# Patient Record
Sex: Male | Born: 1980
Health system: Southern US, Community
[De-identification: ages and names within clinical notes are randomized; demographics above are authoritative.]

## PROBLEM LIST (undated history)

## (undated) ENCOUNTER — Ambulatory Visit (HOSPITAL_COMMUNITY): Discharge status: Absent without leave | Payer: BLUE CROSS/BLUE SHIELD

## (undated) DIAGNOSIS — Z0389 Encounter for observation for other suspected diseases and conditions ruled out: Secondary | ICD-10-CM

## (undated) DIAGNOSIS — G459 Transient cerebral ischemic attack, unspecified: Secondary | ICD-10-CM

## (undated) DIAGNOSIS — G4733 Obstructive sleep apnea (adult) (pediatric): Secondary | ICD-10-CM

## (undated) DIAGNOSIS — I10 Essential (primary) hypertension: Secondary | ICD-10-CM

## (undated) DIAGNOSIS — IMO0001 Reserved for inherently not codable concepts without codable children: Secondary | ICD-10-CM

## (undated) DIAGNOSIS — I428 Other cardiomyopathies: Secondary | ICD-10-CM

## (undated) DIAGNOSIS — E119 Type 2 diabetes mellitus without complications: Secondary | ICD-10-CM

## (undated) DIAGNOSIS — J45909 Unspecified asthma, uncomplicated: Secondary | ICD-10-CM

## (undated) DIAGNOSIS — I5042 Chronic combined systolic (congestive) and diastolic (congestive) heart failure: Secondary | ICD-10-CM

## (undated) HISTORY — DX: Chronic combined systolic (congestive) and diastolic (congestive) heart failure: I50.42

## (undated) HISTORY — PX: NO PAST SURGERIES: SHX2092

## (undated) HISTORY — DX: Reserved for inherently not codable concepts without codable children: IMO0001

## (undated) HISTORY — DX: Transient cerebral ischemic attack, unspecified: G45.9

## (undated) HISTORY — DX: Other cardiomyopathies: I42.8

## (undated) HISTORY — DX: Obstructive sleep apnea (adult) (pediatric): G47.33

## (undated) HISTORY — DX: Encounter for observation for other suspected diseases and conditions ruled out: Z03.89

---

## 2012-04-30 DIAGNOSIS — G459 Transient cerebral ischemic attack, unspecified: Secondary | ICD-10-CM

## 2012-04-30 HISTORY — DX: Transient cerebral ischemic attack, unspecified: G45.9

## 2012-09-15 ENCOUNTER — Emergency Department (HOSPITAL_COMMUNITY): Payer: Self-pay

## 2012-09-15 ENCOUNTER — Emergency Department (HOSPITAL_COMMUNITY)
Admission: EM | Admit: 2012-09-15 | Discharge: 2012-09-15 | Disposition: A | Discharge status: Home / Self Care | Payer: Self-pay | Attending: Emergency Medicine | Admitting: Emergency Medicine

## 2012-09-15 ENCOUNTER — Encounter (HOSPITAL_COMMUNITY): Payer: Self-pay | Admitting: *Deleted

## 2012-09-15 DIAGNOSIS — I10 Essential (primary) hypertension: Secondary | ICD-10-CM | POA: Insufficient documentation

## 2012-09-15 DIAGNOSIS — R739 Hyperglycemia, unspecified: Secondary | ICD-10-CM

## 2012-09-15 DIAGNOSIS — R51 Headache: Secondary | ICD-10-CM | POA: Insufficient documentation

## 2012-09-15 DIAGNOSIS — R7309 Other abnormal glucose: Secondary | ICD-10-CM | POA: Insufficient documentation

## 2012-09-15 DIAGNOSIS — Z79899 Other long term (current) drug therapy: Secondary | ICD-10-CM | POA: Insufficient documentation

## 2012-09-15 DIAGNOSIS — F17200 Nicotine dependence, unspecified, uncomplicated: Secondary | ICD-10-CM | POA: Insufficient documentation

## 2012-09-15 DIAGNOSIS — J45909 Unspecified asthma, uncomplicated: Secondary | ICD-10-CM | POA: Insufficient documentation

## 2012-09-15 HISTORY — DX: Essential (primary) hypertension: I10

## 2012-09-15 HISTORY — DX: Unspecified asthma, uncomplicated: J45.909

## 2012-09-15 LAB — POCT I-STAT, CHEM 8
Calcium, Ion: 1.14 mmol/L (ref 1.12–1.23)
Creatinine, Ser: 1.1 mg/dL (ref 0.50–1.35)
Glucose, Bld: 174 mg/dL — ABNORMAL HIGH (ref 70–99)
HCT: 52 % (ref 39.0–52.0)
Hemoglobin: 17.7 g/dL — ABNORMAL HIGH (ref 13.0–17.0)
Potassium: 3.8 mEq/L (ref 3.5–5.1)
TCO2: 26 mmol/L (ref 0–100)

## 2012-09-15 LAB — URINALYSIS, ROUTINE W REFLEX MICROSCOPIC
Bilirubin Urine: NEGATIVE
Nitrite: NEGATIVE
Protein, ur: NEGATIVE mg/dL
Specific Gravity, Urine: 1.014 (ref 1.005–1.030)
Urobilinogen, UA: 0.2 mg/dL (ref 0.0–1.0)

## 2012-09-15 LAB — CBC WITH DIFFERENTIAL/PLATELET
Basophils Absolute: 0 10*3/uL (ref 0.0–0.1)
Basophils Relative: 1 % (ref 0–1)
Eosinophils Absolute: 0.3 10*3/uL (ref 0.0–0.7)
HCT: 47.2 % (ref 39.0–52.0)
MCH: 31.7 pg (ref 26.0–34.0)
MCHC: 35.8 g/dL (ref 30.0–36.0)
Monocytes Absolute: 0.6 10*3/uL (ref 0.1–1.0)
Neutro Abs: 3.3 10*3/uL (ref 1.7–7.7)
Neutrophils Relative %: 44 % (ref 43–77)
RDW: 12.2 % (ref 11.5–15.5)

## 2012-09-15 MED ORDER — AMLODIPINE BESYLATE 10 MG PO TABS: 5.0000 mg | ORAL_TABLET | Freq: Every day | ORAL | Status: DC

## 2012-09-15 NOTE — ED Provider Notes (Signed)
History     CSN: [id number]  Arrival date & time 09/15/12  0556   First MD Initiated Contact with Patient 09/15/12 [phone]      Chief Complaint  Patient presents with  . Hypertension    (Consider location/radiation/quality/duration/timing/severity/associated sxs/prior treatment) HPI  Pt is a 32 yo M PMHx significant for HTN presenting for one week of high blood pressures taken at home. Pt states he is currently [place] Lisinopril-HCTZ for his high blood pressure and has been taking medications as prescribed. States he had been on Norvasc when his PCP switched him to Lisinopril-HCTZ in January for better BP control. Patient states his blood pressure has been running high since January as well as this last week, just not as high. He states he has a mild generalized headache w/o any weaknesses or visual disturbances. Pt denies any chest pain, shortness of breath, cough, LOC.   Past Medical History  Diagnosis Date  . Hypertension   . Asthma     History reviewed. No pertinent past surgical history.  No family history on file.  History  Substance Use Topics  . Smoking status: Light Tobacco Smoker  . Smokeless tobacco: Not on file  . Alcohol Use: Yes     Comment: Socially Only      Review of Systems  Constitutional: Negative for fever and chills.  Eyes: Negative for visual disturbance.  Respiratory: Negative for cough, chest tightness, shortness of breath and wheezing.   Cardiovascular: Negative for chest pain, palpitations and leg swelling.  Neurological: Negative for seizures and syncope.  All other systems reviewed and are negative.    Allergies  Review of patient's allergies indicates no known allergies.  Home Medications   Current Outpatient Rx  Name  Route  Sig  Dispense  Refill  . albuterol (PROVENTIL HFA;VENTOLIN HFA) 108 (90 BASE) MCG/ACT inhaler   Inhalation   Inhale 2 puffs into the lungs every 6 (six) hours as needed for  wheezing.         Marland Kitchen albuterol (PROVENTIL) (2.5 MG/3ML) 0.083% nebulizer solution   Nebulization   Take 2.5 mg by nebulization every 6 (six) hours as needed for wheezing.         Marland Kitchen lisinopril-hydrochlorothiazide (PRINZIDE,ZESTORETIC) 20-25 MG per tablet   Oral   Take 1 tablet by mouth daily.           BP 145/111  Pulse 95  Temp(Src) 98.1 F (36.7 C) (Oral)  Resp 18  Ht 6' 2.5" (1.892 m)  Wt 241 lb (109.317 kg)  BMI 30.54 kg/m2  SpO2 98%  Physical Exam  Constitutional: He is oriented to person, place, and time. He appears well-developed and well-nourished.  HENT:  Head: Normocephalic and atraumatic.  Mouth/Throat: Oropharynx is clear and moist.  Eyes: EOM are normal. Pupils are equal, round, and reactive to light.  Fundoscopic exam:      The right eye shows no arteriolar narrowing, no AV nicking, no hemorrhage and no papilledema.       The left eye shows no arteriolar narrowing, no AV nicking, no hemorrhage and no papilledema.  Neck: Neck supple. Carotid bruit is not present.  Cardiovascular: Normal rate, regular rhythm, normal heart sounds and intact distal pulses.   Pulmonary/Chest: Effort normal and breath sounds normal. No respiratory distress. He exhibits no tenderness.  Abdominal: Soft. Bowel sounds are normal. There is no tenderness.  Musculoskeletal: Normal range of motion. He exhibits no edema.  Neurological: He  is alert and oriented to person, place, and time. He has normal strength. No cranial nerve deficit or sensory deficit.  Skin: Skin is warm and dry. No erythema.  Psychiatric: He has a normal mood and affect.    ED Course  Procedures (including critical care time)  Labs Reviewed  POCT I-STAT, CHEM 8 - Abnormal; Notable for the following:    Glucose, Bld 174 (*)    Hemoglobin 17.7 (*)    All other components within normal limits  CBC WITH DIFFERENTIAL  URINALYSIS, ROUTINE W REFLEX MICROSCOPIC   Dg Chest 2 View  09/15/2012   *RADIOLOGY REPORT*   Clinical Data: Hypertension.  CHEST - 2 VIEW  Comparison: None  Findings: The cardiac silhouette, mediastinal and hilar contours are within normal limits.  The lungs are clear.  No pleural effusion.  The bony thorax is intact.  IMPRESSION: No acute cardiopulmonary findings.   Original Report Authenticated By: Rudie Meyer, M.D.     1. Hypertension       MDM  Patient noted to be hypertensive in the emergency department.  No signs of hypertensive urgency. No CP, SOB, diaphoresis. Physical exam benign. Labs, UA, and imaging were reviewed no concern for acute emergent cause of elevated BP.  Discussed with patient the need for close follow-up and management by their primary care physician to better control blood pressure. Also advised patient to purchase a more reliable at home blood pressure cuff to provide more accurate readings. Pt notified of elevated blood sugar and advised to have it re-checked at PCP for management and follow up for possible impending diabetes. Patient agreeable to plan. Patient d/w with Dr. Nicanor Alcon, agrees with plan. Patient is stable at time of discharge.           Jeannetta Ellis, PA-C 09/15/12 [phone]

## 2012-09-15 NOTE — ED Notes (Signed)
Pt states he has been having high BP since yesterday.  204/108 was his highest reading.

## 2012-09-17 NOTE — ED Provider Notes (Signed)
Medical screening examination/treatment/procedure(s) were performed by non-physician practitioner and as supervising physician I was immediately available for consultation/collaboration.  Jasmine Awe, MD 09/17/12 [phone]

## 2012-09-18 ENCOUNTER — Observation Stay (HOSPITAL_COMMUNITY): Payer: Self-pay

## 2012-09-18 ENCOUNTER — Observation Stay (HOSPITAL_COMMUNITY)
Admission: EM | Admit: 2012-09-18 | Discharge: 2012-09-19 | Disposition: A | Discharge status: Home / Self Care | Payer: MEDICAID | Attending: Internal Medicine | Admitting: Internal Medicine

## 2012-09-18 ENCOUNTER — Encounter (HOSPITAL_COMMUNITY): Payer: Self-pay | Admitting: Emergency Medicine

## 2012-09-18 ENCOUNTER — Ambulatory Visit: Payer: Self-pay | Attending: Family Medicine | Admitting: Family Medicine

## 2012-09-18 VITALS — BP 179/123 | HR 100 | Temp 98.7°F | Resp 18 | Wt 253.0 lb

## 2012-09-18 DIAGNOSIS — Z8249 Family history of ischemic heart disease and other diseases of the circulatory system: Secondary | ICD-10-CM | POA: Insufficient documentation

## 2012-09-18 DIAGNOSIS — R079 Chest pain, unspecified: Secondary | ICD-10-CM | POA: Insufficient documentation

## 2012-09-18 DIAGNOSIS — F17200 Nicotine dependence, unspecified, uncomplicated: Secondary | ICD-10-CM | POA: Insufficient documentation

## 2012-09-18 DIAGNOSIS — E348 Other specified endocrine disorders: Secondary | ICD-10-CM

## 2012-09-18 DIAGNOSIS — G459 Transient cerebral ischemic attack, unspecified: Principal | ICD-10-CM | POA: Insufficient documentation

## 2012-09-18 DIAGNOSIS — G930 Cerebral cysts: Secondary | ICD-10-CM | POA: Insufficient documentation

## 2012-09-18 DIAGNOSIS — R209 Unspecified disturbances of skin sensation: Secondary | ICD-10-CM | POA: Insufficient documentation

## 2012-09-18 DIAGNOSIS — I6782 Cerebral ischemia: Secondary | ICD-10-CM | POA: Diagnosis present

## 2012-09-18 DIAGNOSIS — I160 Hypertensive urgency: Secondary | ICD-10-CM

## 2012-09-18 DIAGNOSIS — G939 Disorder of brain, unspecified: Secondary | ICD-10-CM | POA: Diagnosis present

## 2012-09-18 DIAGNOSIS — I10 Essential (primary) hypertension: Secondary | ICD-10-CM | POA: Insufficient documentation

## 2012-09-18 DIAGNOSIS — R0602 Shortness of breath: Secondary | ICD-10-CM | POA: Insufficient documentation

## 2012-09-18 DIAGNOSIS — R51 Headache: Secondary | ICD-10-CM | POA: Insufficient documentation

## 2012-09-18 DIAGNOSIS — J45909 Unspecified asthma, uncomplicated: Secondary | ICD-10-CM | POA: Diagnosis present

## 2012-09-18 LAB — TROPONIN I: Troponin I: <0.3 ng/mL (ref <0.30)

## 2012-09-18 LAB — RAPID URINE DRUG SCREEN, HOSP PERFORMED
Barbiturates: NOT DETECTED
Cocaine: NOT DETECTED
Tetrahydrocannabinol: NOT DETECTED

## 2012-09-18 LAB — POCT I-STAT, CHEM 8
Calcium, Ion: 1.17 mmol/L (ref 1.12–1.23)
Chloride: 108 mEq/L (ref 96–112)
Glucose, Bld: 169 mg/dL — ABNORMAL HIGH (ref 70–99)
HCT: 47 % (ref 39.0–52.0)
Hemoglobin: 16 g/dL (ref 13.0–17.0)
TCO2: 25 mmol/L (ref 0–100)

## 2012-09-18 LAB — GLUCOSE, CAPILLARY: Glucose-Capillary: 100 mg/dL — ABNORMAL HIGH (ref 70–99)

## 2012-09-18 LAB — POCT I-STAT TROPONIN I: Troponin i, poc: 0.01 ng/mL (ref 0.00–0.08)

## 2012-09-18 MED ORDER — LISINOPRIL 20 MG PO TABS
20.0000 mg | ORAL_TABLET | Freq: Every day | ORAL | Status: DC
  Administered 2012-09-19: 20 mg via ORAL
  Filled 2012-09-18: qty 1

## 2012-09-18 MED ORDER — ASPIRIN 325 MG PO TABS: 325.0000 mg | ORAL_TABLET | Freq: Once | ORAL | Status: DC

## 2012-09-18 MED ORDER — ASPIRIN 325 MG PO TABS
325.0000 mg | ORAL_TABLET | Freq: Every day | ORAL | Status: DC
  Filled 2012-09-18: qty 1

## 2012-09-18 MED ORDER — FLUTICASONE PROPIONATE HFA 110 MCG/ACT IN AERO
1.0000 | INHALATION_SPRAY | Freq: Two times a day (BID) | RESPIRATORY_TRACT | Status: DC
  Administered 2012-09-18 – 2012-09-19 (×2): 1 via RESPIRATORY_TRACT
  Filled 2012-09-18: qty 12

## 2012-09-18 MED ORDER — NITROGLYCERIN 0.4 MG SL SUBL: 0.4000 mg | SUBLINGUAL_TABLET | Freq: Once | SUBLINGUAL | Status: DC

## 2012-09-18 MED ORDER — HYDROCHLOROTHIAZIDE 25 MG PO TABS
25.0000 mg | ORAL_TABLET | Freq: Every day | ORAL | Status: DC
  Administered 2012-09-19: 25 mg via ORAL
  Filled 2012-09-18: qty 1

## 2012-09-18 MED ORDER — ACETAMINOPHEN 325 MG PO TABS
650.0000 mg | ORAL_TABLET | ORAL | Status: DC | PRN
  Administered 2012-09-18: 650 mg via ORAL
  Filled 2012-09-18: qty 2

## 2012-09-18 MED ORDER — STROKE: EARLY STAGES OF RECOVERY BOOK
Freq: Once | Status: AC
  Administered 2012-09-18: 21:00:00
  Filled 2012-09-18: qty 1

## 2012-09-18 MED ORDER — LABETALOL HCL 5 MG/ML IV SOLN
10.0000 mg | Freq: Once | INTRAVENOUS | Status: AC
  Administered 2012-09-18: 10 mg via INTRAVENOUS
  Filled 2012-09-18: qty 4

## 2012-09-18 MED ORDER — ALBUTEROL SULFATE HFA 108 (90 BASE) MCG/ACT IN AERS
2.0000 | INHALATION_SPRAY | Freq: Four times a day (QID) | RESPIRATORY_TRACT | Status: DC | PRN
  Filled 2012-09-18: qty 6.7

## 2012-09-18 MED ORDER — LISINOPRIL-HYDROCHLOROTHIAZIDE 20-25 MG PO TABS: 1.0000 | ORAL_TABLET | Freq: Every day | ORAL | Status: DC

## 2012-09-18 MED ORDER — ENOXAPARIN SODIUM 40 MG/0.4ML ~~LOC~~ SOLN
40.0000 mg | SUBCUTANEOUS | Status: DC
  Administered 2012-09-18: 40 mg via SUBCUTANEOUS
  Filled 2012-09-18 (×2): qty 0.4

## 2012-09-18 MED ORDER — LABETALOL HCL 5 MG/ML IV SOLN
10.0000 mg | INTRAVENOUS | Status: DC | PRN
  Administered 2012-09-18 (×2): 10 mg via INTRAVENOUS
  Filled 2012-09-18 (×2): qty 4

## 2012-09-18 NOTE — ED Notes (Signed)
Was seen on the 19 for HTN and tried to see dr but not feeling better and having vague cp left shoulder pain and tingling in fingers

## 2012-09-18 NOTE — ED Notes (Signed)
Patient transported to CT 

## 2012-09-18 NOTE — ED Notes (Signed)
Vascular lab called to report they will complete Doppler and 2 D echo tomorrow

## 2012-09-18 NOTE — H&P (Signed)
[facility] History and Physical  Joseph Shannon HYQ:[id number] DOB: 10-02-1980 DOA: 09/18/2012  Referring physician: ED PCP: No PCP Per Patient  Specialists: none  Chief Complaint: left arm numbness   HPI: Joseph Shannon is a 32 y.o. male with history of HTN, who presented to the urgent care center today c/o left side numbness. He also has noted that his BP is poor ly controlled and every time is up he gets a left facial headache and LUE numbness. Denies chest pain. Symptoms started 2 weeks ago.    Review of Systems: The patient denies anorexia, fever, weight loss,, vision loss, decreased hearing, hoarseness, chest pain, syncope, dyspnea on exertion, peripheral edema, balance deficits, hemoptysis, abdominal pain, melena, hematochezia, severe indigestion/heartburn, hematuria, incontinence, genital sores, muscle weakness, suspicious skin lesions, transient blindness, difficulty walking, depression, unusual weight change, abnormal bleeding, enlarged lymph nodes, angioedema,   Past Medical History  Diagnosis Date  . Hypertension   . Asthma    History reviewed. No pertinent past surgical history. Social History:  reports that he has been smoking Cigarettes.  He has been smoking about 0.50 packs per day. He does not have any smokeless tobacco history on file. He reports that he does not drink alcohol or use illicit drugs. Lives with friend  He just moved to the area from [place] and is looking for a job  No Known Allergies  Family History  Problem Relation Age of Onset  . Hypertension Father   . Hypertension Mother     Prior to Admission medications   Medication Sig Start Date End Date Taking? Authorizing Provider  albuterol (PROVENTIL HFA;VENTOLIN HFA) 108 (90 BASE) MCG/ACT inhaler Inhale 2 puffs into the lungs every 6 (six) hours as needed for wheezing.   Yes Historical Provider, MD  albuterol (PROVENTIL) (2.5 MG/3ML) 0.083% nebulizer solution Take 2.5 mg by  nebulization every 6 (six) hours as needed for wheezing.   Yes Historical Provider, MD  fluticasone (FLOVENT HFA) 110 MCG/ACT inhaler Inhale 1 puff into the lungs 2 (two) times daily.   Yes Historical Provider, MD  lisinopril-hydrochlorothiazide (PRINZIDE,ZESTORETIC) 20-25 MG per tablet Take 1 tablet by mouth daily.   Yes Historical Provider, MD   Physical Exam: Filed Vitals:   09/18/12 1350 09/18/12 1351 09/18/12 1416 09/18/12 1445  BP:  144/94 180/113 170/111  Pulse: 97   85  Temp: 98.3 F (36.8 C)     Resp: 16     SpO2: 99%   99%     General:  axox3  Eyes: perrla, eomi, anicteric, pale  ENT: clear throat   Neck: no JVD  Cardiovascular: RRR, no M<r,g  Respiratory: ctab , no w,r,c   Abdomen: soft, nt, bs present   Skin: dry, no rashes  Musculoskeletal: intact   Psychiatric: euthymic  Neurologic: facial asymmetry, left UE with decreased sensation, strength 5/5, left UE dysmetria, gait intact, speech intact, no aphasia - NIH stroke scale 1-2   Labs on Admission:  Basic Metabolic Panel:  Recent Labs Lab 09/15/12 0638 09/18/12 1423  NA 138 141  K 3.8 4.2  CL 104 108  GLUCOSE 174* 169*  BUN 20 14  CREATININE 1.10 1.00   Liver Function Tests: No results found for this basename: AST, ALT, ALKPHOS, BILITOT, PROT, ALBUMIN,  in the last 168 hours No results found for this basename: LIPASE, AMYLASE,  in the last 168 hours No results found for this basename: AMMONIA,  in the last 168 hours CBC:  Recent Labs Lab 09/15/12 0621 09/15/12  9147 09/18/12 1423  WBC 7.5  --   --   NEUTROABS 3.3  --   --   HGB 16.9 17.7* 16.0  HCT 47.2 52.0 47.0  MCV 88.6  --   --   PLT 214  --   --    Cardiac Enzymes: No results found for this basename: CKTOTAL, CKMB, CKMBINDEX, TROPONINI,  in the last 168 hours  BNP (last 3 results) No results found for this basename: PROBNP,  in the last 8760 hours CBG: No results found for this basename: GLUCAP,  in the last 168  hours  Radiological Exams on Admission: No results found.  EKG: Independently reviewed. NSR, LVH  Assessment/Plan Principal Problem:   TIA (transient ischemic attack) Active Problems:   Hypertension   Asthma   1. TIA - risk factors, tobacco, HTN - DDx PRESS. Will place in observation, Ct head, MRI, labetalol iv, aspirin, frequent neuro checks, a1c, flp 2. LVH - get echo , cycle troponins 3. Asthma - c/w flovent and albuterol prn   Code Status: full (must indicate code status--if unknown or must be presumed, indicate so) Family Communication: patient  (indicate person spoken with, if applicable, with phone number if by telephone) Disposition Plan: home  (indicate anticipated LOS)    Joseph Shannon [facility] Pager [phone]  If 7PM-7AM, please contact night-coverage [email or web address] Password [facility] 09/18/2012, 4:16 PM

## 2012-09-18 NOTE — ED Notes (Signed)
Has been peeing a lot and has h/a

## 2012-09-18 NOTE — ED Notes (Signed)
Pt transported to 3W via stretcher on a monitor by [place] NT

## 2012-09-18 NOTE — ED Notes (Signed)
Reports hypertension for past two weeks, even though meds were changed in January. Reports dizziness, blurred vision, nausea, vomiting. Pain in left shoulder and arm, reports numbness at times.

## 2012-09-18 NOTE — ED Provider Notes (Signed)
History     CSN: [id number]  Arrival date & time 09/18/12  1345   First MD Initiated Contact with Patient 09/18/12 1401      Chief Complaint  Patient presents with  . Hypertension    (Consider location/radiation/quality/duration/timing/severity/associated sxs/prior treatment) HPI Comments: Patient presents with elevated blood pressure. He states he has a history of hypertension in in January was started on lisinopril hydrochlorothiazide. He's been taking his medication consistently since then. He states over last 2 weeks he's had some intermittent chest pains. He frequently has it in the morning. He states it starts in the left side of his chest and as a sharp pain that shoots down his arm into shoulder. He has shortness of breath at times but it's not related to the chest pain. He has nausea at times but it's also not associated with chest pain. He denies any diaphoresis. He went to urgent care Center today for his hypertension and he was complaining of some left shoulder pain radiating down his arm with some tingling in his fingers and they sent him here for further cardiac evaluation. He denies any history of heart problems in the past. He is a smoker but is currently trying to quit smoking. He states he has a family history of heart disease in that his mom had MI her 30s.  Patient is a 32 y.o. male presenting with hypertension.  Hypertension Associated symptoms include chest pain and shortness of breath. Pertinent negatives include no abdominal pain and no headaches.    Past Medical History  Diagnosis Date  . Hypertension   . Asthma     History reviewed. No pertinent past surgical history.  No family history on file.  History  Substance Use Topics  . Smoking status: Light Tobacco Smoker  . Smokeless tobacco: Not on file  . Alcohol Use: Yes     Comment: Socially Only      Review of Systems  Constitutional: Positive for fatigue. Negative for fever, chills and diaphoresis.   HENT: Negative for congestion, rhinorrhea and sneezing.   Eyes: Negative.   Respiratory: Positive for shortness of breath. Negative for cough and chest tightness.   Cardiovascular: Positive for chest pain. Negative for leg swelling.  Gastrointestinal: Positive for nausea. Negative for vomiting, abdominal pain, diarrhea and blood in stool.  Genitourinary: Negative for frequency, hematuria, flank pain and difficulty urinating.  Musculoskeletal: Negative for back pain and arthralgias.  Skin: Negative for rash.  Neurological: Negative for dizziness, speech difficulty, weakness, numbness and headaches.    Allergies  Review of patient's allergies indicates no known allergies.  Home Medications   Current Outpatient Rx  Name  Route  Sig  Dispense  Refill  . albuterol (PROVENTIL HFA;VENTOLIN HFA) 108 (90 BASE) MCG/ACT inhaler   Inhalation   Inhale 2 puffs into the lungs every 6 (six) hours as needed for wheezing.         Marland Kitchen albuterol (PROVENTIL) (2.5 MG/3ML) 0.083% nebulizer solution   Nebulization   Take 2.5 mg by nebulization every 6 (six) hours as needed for wheezing.         . fluticasone (FLOVENT HFA) 110 MCG/ACT inhaler   Inhalation   Inhale 1 puff into the lungs 2 (two) times daily.         Marland Kitchen lisinopril-hydrochlorothiazide (PRINZIDE,ZESTORETIC) 20-25 MG per tablet   Oral   Take 1 tablet by mouth daily.           BP 180/113  Pulse 97  Temp(Src) 98.3  F (36.8 C)  Resp 16  SpO2 99%  Physical Exam  Constitutional: He is oriented to person, place, and time. He appears well-developed and well-nourished.  HENT:  Head: Normocephalic and atraumatic.  Eyes: Pupils are equal, round, and reactive to light.  Neck: Normal range of motion. Neck supple.  Cardiovascular: Normal rate, regular rhythm and normal heart sounds.   Pulmonary/Chest: Effort normal and breath sounds normal. No respiratory distress. He has no wheezes. He has no rales. He exhibits no tenderness.   Abdominal: Soft. Bowel sounds are normal. There is no tenderness. There is no rebound and no guarding.  Musculoskeletal: Normal range of motion. He exhibits no edema.  Patient does have some reproducible tenderness to left shoulder.  Lymphadenopathy:    He has no cervical adenopathy.  Neurological: He is alert and oriented to person, place, and time.  Skin: Skin is warm and dry. No rash noted.  Psychiatric: He has a normal mood and affect.    ED Course  Procedures (including critical care time)  Results for orders placed during the hospital encounter of 09/18/12  POCT I-STAT, CHEM 8      Result Value Range   Sodium 141  135 - 145 mEq/L   Potassium 4.2  3.5 - 5.1 mEq/L   Chloride 108  96 - 112 mEq/L   BUN 14  6 - 23 mg/dL   Creatinine, Ser 4.09  0.50 - 1.35 mg/dL   Glucose, Bld 811 (*) 70 - 99 mg/dL   Calcium, Ion 9.14  7.82 - 1.23 mmol/L   TCO2 25  0 - 100 mmol/L   Hemoglobin 16.0  13.0 - 17.0 g/dL   HCT 95.6  21.3 - 08.6 %  POCT I-STAT TROPONIN I      Result Value Range   Troponin i, poc 0.01  0.00 - 0.08 ng/mL   Comment 3            Dg Chest 2 View  09/15/2012   *RADIOLOGY REPORT*  Clinical Data: Hypertension.  CHEST - 2 VIEW  Comparison: None  Findings: The cardiac silhouette, mediastinal and hilar contours are within normal limits.  The lungs are clear.  No pleural effusion.  The bony thorax is intact.  IMPRESSION: No acute cardiopulmonary findings.   Original Report Authenticated By: Rudie Meyer, M.D.    Date: 09/18/2012  Rate: 96  Rhythm: normal sinus rhythm  QRS Axis: normal  Intervals: normal  ST/T Wave abnormalities: normal, slight ST depression  Conduction Disutrbances:none  Narrative Interpretation:   Old EKG Reviewed: none available     1. Chest pain       MDM  Patient with a left-sided chest pain radiating to his left shoulder and arm. He does have some exertional shortness of breath. He has no history of heart disease but he is a smoker. He has a  history of hypertension and a strong family history of diabetes and early heart disease. His blood sugars are elevated at 169 and his blood sugar a few days ago was 179. He has no primary care physician. Given these factors I feel the best to place in observation for a chest pain rule out. He had some minor ST depression in lead 3 but I don't see other EKG abnormalities. He has no old EKG for comparison. He was given aspirin prior to arrival and he was given a nitroglycerin that subsided his symptoms. He currently denies any chest pain. He had a chest x-ray 3 days ago which  was normal so I do not feel this needs be repeated.        Rolan Bucco, MD 09/18/12 1447

## 2012-09-18 NOTE — Progress Notes (Signed)
Patient was seen in the ED this past week for HTN Stated they did not do anything for him  No additional medication given Presents today with a B/P 179/123

## 2012-09-18 NOTE — ED Notes (Signed)
Receiving RN will return call for report in 10 mins

## 2012-09-18 NOTE — Progress Notes (Signed)
Patient ID: Joseph Shannon, male   DOB: 12/12/1980, 32 y.o.   MRN: [id number]  Pt arrived today for ED f/u having been seen recently for HTN. Today his BP is 179/123. He also c/o headache as well as vague mild chest pain,  left arm pain and tingling, and shob. These have been going on progressively worsening for 1 week.   GIven these sx have given pt asa and nitro x 1 and am having him xported to the ED for further evaluation. He is stable for non-ambulance xport and will go via shuttle.   If time prior to shuttle arriving will obtain ecg.   Am happy to see pt in f/u after he has been discharged.

## 2012-09-19 ENCOUNTER — Observation Stay (HOSPITAL_COMMUNITY): Payer: MEDICAID

## 2012-09-19 DIAGNOSIS — I517 Cardiomegaly: Secondary | ICD-10-CM

## 2012-09-19 DIAGNOSIS — E348 Other specified endocrine disorders: Secondary | ICD-10-CM

## 2012-09-19 DIAGNOSIS — I160 Hypertensive urgency: Secondary | ICD-10-CM

## 2012-09-19 LAB — LIPID PANEL: LDL Cholesterol: 122 mg/dL — ABNORMAL HIGH (ref 0–99)

## 2012-09-19 LAB — GLUCOSE, CAPILLARY: Glucose-Capillary: 109 mg/dL — ABNORMAL HIGH (ref 70–99)

## 2012-09-19 MED ORDER — METOPROLOL TARTRATE 25 MG PO TABS: 25.0000 mg | ORAL_TABLET | Freq: Two times a day (BID) | ORAL | Status: DC

## 2012-09-19 MED ORDER — LISINOPRIL-HYDROCHLOROTHIAZIDE 20-25 MG PO TABS: 2.0000 | ORAL_TABLET | Freq: Every day | ORAL | Status: DC

## 2012-09-19 MED ORDER — LABETALOL HCL 200 MG PO TABS
200.0000 mg | ORAL_TABLET | Freq: Two times a day (BID) | ORAL | Status: DC
  Administered 2012-09-19: 200 mg via ORAL
  Filled 2012-09-19 (×2): qty 1

## 2012-09-19 MED ORDER — GADOBENATE DIMEGLUMINE 529 MG/ML IV SOLN
20.0000 mL | Freq: Once | INTRAVENOUS | Status: AC
  Administered 2012-09-19: 20 mL via INTRAVENOUS

## 2012-09-19 NOTE — Discharge Summary (Signed)
Physician Discharge Summary  Joseph Shannon ZOX:[id number] DOB: Apr 24, 1981 DOA: 09/18/2012  PCP: No PCP Per Patient  Admit date: 09/18/2012 Discharge date: 09/19/2012  Recommendations for Outpatient Follow-up:  1. Follow up with primary care doctor within 1-2 weeks of discharge for blood pressure management 2. Follow up MRI brain with contrast in 3 months 3. Please refer for outpatient stress test to evaluate possible apical hypokinesia.    Discharge Diagnoses:  Principal Problem:   TIA (transient ischemic attack) Active Problems:   Hypertension   Asthma   Hypertensive urgency   Pineal gland cyst   Discharge Condition: stable, improved  Diet recommendation: healthy heart  Wt Readings from Last 3 Encounters:  09/18/12 114.76 kg (253 lb)  09/18/12 114.76 kg (253 lb)  09/15/12 109.317 kg (241 lb)    History of present illness:   Joseph Shannon is a 32 y.o. male with history of HTN, who presented to the urgent care center today c/o left side numbness. He also has noted that his BP is poor ly controlled and every time is up he gets a left facial headache and LUE numbness. Denies chest pain. Symptoms started 2 weeks ago.    Hospital Course:   Intermittent numbness of the LUE and left facial headache during episode of high blood pressure. Completely resolved after blood pressure trended down.   - Control blood pressure  - CT head normal  - MRI brain negative for acute stroke and PRESS, but positive for abnormal pineal gland.  - A1c: 5.9  - Carotid duplex neg  - ECHO:  Mild LVH and EF of 50-55% with possible hypokinesis of the apical myocardium.  Normal left atrium.     Lab Results   Component  Value  Date    CHOL  200  09/19/2012    HDL  42  09/19/2012    LDLCALC  122*  09/19/2012    TRIG  178*  09/19/2012    CHOLHDL  4.8  09/19/2012    Pineal gland cystic structure 1.5 x 1.5 x 0.9 cm with mild compression of the superior colliculus.   -  Repeat MRI with contrast  demonstrated nonenhancing cyst of the pineal gland.  -  Spoke with Dr. Yetta Barre from Neurosurgery who recommends repeat MRI in 3 months  -  Possible bulge of the M1 segment that is likely artifact. Aneurysm less likely   HTN:  - Increased lisinopril and HCTZ home medications  - Added metoprolol BID   Asthma, stable. Continue prn albuterol.    Consultants:  none Procedures:  CT head  MRI brain WO contrast  MRI brain W contrast  MRA brain  Carotid duplex  ECHO Antibiotics:  none   Discharge Exam: Filed Vitals:   09/19/12 1356  BP: 163/91  Pulse: 85  Temp: 98.8 F (37.1 C)  Resp: 19   Filed Vitals:   09/19/12 0000 09/19/12 0400 09/19/12 1000 09/19/12 1356  BP: 154/97 157/99 148/88 163/91  Pulse: 80 75 80 85  Temp: 98.7 F (37.1 C) 98.3 F (36.8 C) 98.5 F (36.9 C) 98.8 F (37.1 C)  TempSrc:   Oral Oral  Resp: 18 18 20 19   Height:      Weight:      SpO2: 95% 96% 98% 94%    General: Obese AAM, No acute distress  HEENT: NCAT, MMM  Cardiovascular: RRR, nl S1, S2 no mrg, 2+ pulses, warm extremities  Respiratory: CTAB, no increased WOB  Abdomen: NABS, soft, NT/ND  MSK: Normal tone  and bulk, no LEE  Neuro: CN II-XII grossly intact, strength 5/5 throughout, sensation intact to light touch   Discharge Instructions      Discharge Orders   Future Orders Complete By Expires     Call MD for:  difficulty breathing, headache or visual disturbances  As directed     Call MD for:  extreme fatigue  As directed     Call MD for:  hives  As directed     Call MD for:  persistant dizziness or light-headedness  As directed     Call MD for:  persistant nausea and vomiting  As directed     Call MD for:  severe uncontrolled pain  As directed     Call MD for:  temperature >100.4  As directed     Diet - low sodium heart healthy  As directed     Discharge instructions  As directed     Comments:      You were hospitalized with some vision changes, tingling of the left arm, and  severe high blood pressure.  Please increase your blood pressure medication lisinopril-HCTZ to 2 tabs once a day and add a new blood pressure medication metoprolol.  Please follow up with your primary care doctor within 1-2 weeks of discharge to review your blood pressure.  If you can, please check your blood pressure at the pharmacy or [facility] a few times before you follow up and write down the numbers to take with you to your follow up appointment.  You did not have a stroke, but you do have a cyst of the pineal gland.  This is a common finding and usually benign.  Please have your primary care doctor repeat an MRI in about 3 months to make sure it is not changing.  Finally, you had some thickening of the heart muscle and some change in the squeeze at the apex of your heart.  Your primary care doctor may refer you to have a stress test to make sure your heart is pumping normally.    Increase activity slowly  As directed         Medication List    TAKE these medications       albuterol (2.5 MG/3ML) 0.083% nebulizer solution  Commonly known as:  PROVENTIL  Take 2.5 mg by nebulization every 6 (six) hours as needed for wheezing.     albuterol 108 (90 BASE) MCG/ACT inhaler  Commonly known as:  PROVENTIL HFA;VENTOLIN HFA  Inhale 2 puffs into the lungs every 6 (six) hours as needed for wheezing.     fluticasone 110 MCG/ACT inhaler  Commonly known as:  FLOVENT HFA  Inhale 1 puff into the lungs 2 (two) times daily.     lisinopril-hydrochlorothiazide 20-25 MG per tablet  Commonly known as:  PRINZIDE,ZESTORETIC  Take 2 tablets by mouth daily.     metoprolol tartrate 25 MG tablet  Commonly known as:  LOPRESSOR  Take 1 tablet (25 mg total) by mouth 2 (two) times daily.       Follow-up Information   Follow up with No PCP Per Patient In 1 week.   Contact information:   [address] [place] [address] [phone]       The results of significant diagnostics from this  hospitalization (including imaging, microbiology, ancillary and laboratory) are listed below for reference.    Significant Diagnostic Studies: Dg Chest 2 View  09/15/2012   *RADIOLOGY REPORT*  Clinical Data: Hypertension.  CHEST -  2 VIEW  Comparison: None  Findings: The cardiac silhouette, mediastinal and hilar contours are within normal limits.  The lungs are clear.  No pleural effusion.  The bony thorax is intact.  IMPRESSION: No acute cardiopulmonary findings.   Original Report Authenticated By: Rudie Meyer, M.D.   Ct Head Wo Contrast  09/18/2012   *RADIOLOGY REPORT*  Clinical Data: Headache, right arm numbness, TIA, history hypertension, asthma  CT HEAD WITHOUT CONTRAST  Technique:  Contiguous axial images were obtained from the base of the skull through the vertex without contrast.  Comparison: None  Findings: Normal ventricular morphology. No midline shift or mass effect. Normal appearance of brain parenchyma. No intracranial hemorrhage, mass lesion or evidence of acute infarction. No extra-axial fluid collections. Bones and sinuses unremarkable.  IMPRESSION: No acute intracranial abnormalities.   Original Report Authenticated By: Ulyses Southward, M.D.   Mri Brain Without Contrast  09/18/2012   *RADIOLOGY REPORT*  Clinical Data:  Left arm numbness for past week.  High blood pressure.  Left facial headaches.  MRI BRAIN WITHOUT CONTRAST MRA HEAD WITHOUT CONTRAST  Technique: Multiplanar, multiecho pulse sequences of the brain and surrounding structures were obtained according to standard protocol without intravenous contrast.  Angiographic images of the head were obtained using MRA technique without contrast.  Comparison: 09/18/2012 head CT.  No comparison brain MR.  MRI HEAD  Findings:  No acute infarct.  No intracranial hemorrhage.  No hydrocephalus.  Pineal gland is enlarged appearing cystic spanning over 1.5 x 1.5 x 0.9 cm.  Mild compression of the superior colliculus. This may represent an incidental  finding assuming the patient does not have diplopia with upward gaze.  Dedicated thin section pre and postcontrast MR imaging to exclude worrisome enhancement can be performed at the present time as a baseline exam.  Minimal calcifications along the anterior margin on accompanying CT.  Decreased signal intensity bone marrow of the clivus and upper cervical spine may be normal for this patient.  Correlation with CBC to exclude anemia contributing to this appearance may be considered.  Exophthalmos.  IMPRESSION: No acute infarct.  Enlarged cystic-appearing pineal gland with follow up as noted above.  Decreased signal intensity bone marrow of the clivus and upper cervical spine may be normal for this patient.  Correlation with CBC to exclude anemia contributing to this appearance may be considered.  MRA HEAD  Findings: Anterior circulation without medium or large size vessel significant stenosis or occlusion.  Bulge superior margin of the M1 segment of the left middle cerebral artery appears to be an origin of a vessel on source sequence. Aneurysm felt a less likely consideration.  Stability can be confirmed on follow-up.  Middle cerebral artery mild branch vessel irregularity.  Ectatic vertebral arteries and basilar artery.  No high-grade stenosis.  Nonvisualization right PICA and left AICA.  Low-lying large right AICA.  Posterior cerebral artery and the superior cerebral artery mild branch vessel irregularity.  IMPRESSION: Anterior circulation without medium or large size vessel significant stenosis or occlusion.  Bulge superior margin of the M1 segment of the left middle cerebral artery appears to be an origin of a vessel on source sequence. Aneurysm felt a less likely consideration.  Stability can be confirmed on follow-up.  Ectatic vertebral arteries and basilar artery.  Nonvisualization right PICA and left AICA.  Low-lying large right AICA.   Original Report Authenticated By: Lacy Duverney, M.D.   Mr Laqueta Jean  Contrast  09/19/2012   *RADIOLOGY REPORT*  Clinical Data: Evaluate pineal  cyst.  MRI HEAD WITH CONTRAST  Technique:  Multiplanar, multiecho pulse sequences of the brain and surrounding structures were obtained according to standard protocol with intravenous contrast  Contrast:  20 ml Multihance IV  Comparison: MRI 09/18/2012  Findings: Prominent pineal cyst measures 16 x 9 mm on sagittal images.  This has CSF signal intensity.  Post contrast thin section imaging was performed.  No enhancing mass lesion is seen in the cyst.  This appears to be a benign pineal cyst.  There is mild mass effect on the superior tectum as noted previously.  No obstructive hydrocephalous.  Normal enhancement of the brain.  IMPRESSION: Prominent pineal cyst has benign signal characteristics without associated soft tissue mass.  There is mass effect on the superior tectum which could cause symptoms of third cranial nerve palsy. Correlate with physical exam.   Original Report Authenticated By: Janeece Riggers, M.D.   Mr Mra Head/brain Wo Cm  09/18/2012   *RADIOLOGY REPORT*  Clinical Data:  Left arm numbness for past week.  High blood pressure.  Left facial headaches.  MRI BRAIN WITHOUT CONTRAST MRA HEAD WITHOUT CONTRAST  Technique: Multiplanar, multiecho pulse sequences of the brain and surrounding structures were obtained according to standard protocol without intravenous contrast.  Angiographic images of the head were obtained using MRA technique without contrast.  Comparison: 09/18/2012 head CT.  No comparison brain MR.  MRI HEAD  Findings:  No acute infarct.  No intracranial hemorrhage.  No hydrocephalus.  Pineal gland is enlarged appearing cystic spanning over 1.5 x 1.5 x 0.9 cm.  Mild compression of the superior colliculus. This may represent an incidental finding assuming the patient does not have diplopia with upward gaze.  Dedicated thin section pre and postcontrast MR imaging to exclude worrisome enhancement can be performed at the  present time as a baseline exam.  Minimal calcifications along the anterior margin on accompanying CT.  Decreased signal intensity bone marrow of the clivus and upper cervical spine may be normal for this patient.  Correlation with CBC to exclude anemia contributing to this appearance may be considered.  Exophthalmos.  IMPRESSION: No acute infarct.  Enlarged cystic-appearing pineal gland with follow up as noted above.  Decreased signal intensity bone marrow of the clivus and upper cervical spine may be normal for this patient.  Correlation with CBC to exclude anemia contributing to this appearance may be considered.  MRA HEAD  Findings: Anterior circulation without medium or large size vessel significant stenosis or occlusion.  Bulge superior margin of the M1 segment of the left middle cerebral artery appears to be an origin of a vessel on source sequence. Aneurysm felt a less likely consideration.  Stability can be confirmed on follow-up.  Middle cerebral artery mild branch vessel irregularity.  Ectatic vertebral arteries and basilar artery.  No high-grade stenosis.  Nonvisualization right PICA and left AICA.  Low-lying large right AICA.  Posterior cerebral artery and the superior cerebral artery mild branch vessel irregularity.  IMPRESSION: Anterior circulation without medium or large size vessel significant stenosis or occlusion.  Bulge superior margin of the M1 segment of the left middle cerebral artery appears to be an origin of a vessel on source sequence. Aneurysm felt a less likely consideration.  Stability can be confirmed on follow-up.  Ectatic vertebral arteries and basilar artery.  Nonvisualization right PICA and left AICA.  Low-lying large right AICA.   Original Report Authenticated By: Lacy Duverney, M.D.    Microbiology: No results found for this or any  previous visit (from the past 240 hour(s)).   Labs: Basic Metabolic Panel:  Recent Labs Lab 09/15/12 0638 09/18/12 1423  NA 138 141  K  3.8 4.2  CL 104 108  GLUCOSE 174* 169*  BUN 20 14  CREATININE 1.10 1.00   Liver Function Tests: No results found for this basename: AST, ALT, ALKPHOS, BILITOT, PROT, ALBUMIN,  in the last 168 hours No results found for this basename: LIPASE, AMYLASE,  in the last 168 hours No results found for this basename: AMMONIA,  in the last 168 hours CBC:  Recent Labs Lab 09/15/12 0621 09/15/12 0638 09/18/12 1423  WBC 7.5  --   --   NEUTROABS 3.3  --   --   HGB 16.9 17.7* 16.0  HCT 47.2 52.0 47.0  MCV 88.6  --   --   PLT 214  --   --    Cardiac Enzymes:  Recent Labs Lab 09/18/12 1632 09/18/12 2228  TROPONINI <0.30 <0.30   BNP: BNP (last 3 results) No results found for this basename: PROBNP,  in the last 8760 hours CBG:  Recent Labs Lab 09/18/12 2154 09/19/12 0750 09/19/12 1128  GLUCAP 100* 109* 107*    Time coordinating discharge: 45 minutes  Signed:  Carie Kapuscinski  [facility] 09/19/2012, 3:11 PM

## 2012-09-19 NOTE — Progress Notes (Signed)
*  PRELIMINARY RESULTS* Vascular Ultrasound Carotid Doppler has been completed.  Preliminary findings:  Bilaterally <40% ICA stenosis. Antegrade vertebral flow.  Farrel Demark, RDMS, RVT  09/19/2012, 8:28 AM

## 2012-09-19 NOTE — Care Management Note (Signed)
    Page 1 of 1   09/19/2012     12:49:00 PM   CARE MANAGEMENT NOTE 09/19/2012  Patient:  Joseph Shannon, Joseph Shannon   Account Number:  [id number]  Date Initiated:  09/19/2012  Documentation initiated by:  GRAVES-BIGELOW,Amrit Erck  Subjective/Objective Assessment:   Pt admitted with left arm numbness. Bp was elevated.  Pt was in a procedure at time of visit.     Action/Plan:   CM did provide pt with list of PCP and the best option for the [facility]. Pt to make a f/u appointment. MD please try to keep pt on cheap meds that can be purchased on the $4.00 med list.   Anticipated DC Date:  09/19/2012   Anticipated DC Plan:  HOME/SELF CARE      DC Planning Services  CM consult      Choice offered to / List presented to:             Status of service:  Completed, signed off Medicare Important Message given?   (If response is "NO", the following Medicare IM given date fields will be blank) Date Medicare IM given:   Date Additional Medicare IM given:    Discharge Disposition:  HOME/SELF CARE  Per UR Regulation:  Reviewed for med. necessity/level of care/duration of stay  If discussed at Long Length of Stay Meetings, dates discussed:    Comments:  09-19-12 1245 Tomi Bamberger, RN, BSN [phone] No further needs from Cm at this time.

## 2012-09-19 NOTE — Progress Notes (Signed)
  Echocardiogram 2D Echocardiogram has been performed.  Joseph Shannon 09/19/2012, 9:09 AM

## 2012-09-19 NOTE — Progress Notes (Signed)
[facility] PROGRESS NOTE  Joseph Shannon NWG:[id number] DOB: [birth year] DOA: 09/18/2012 PCP: No PCP Per Patient  Assessment/Plan  Intermittent numbness of the LUE and left facial headache during episode of hight blood pressure.  Completely resolved after blood pressure trended down.   -  Control blood pressure -  CT head normal -  MRI brain negative for acute stroke and PRESS, but positive for abnormal pineal gland.   -  A1c:  5.9 -  Carotid duplex neg -  ECHO pending, but symptoms less suggestive of TIA and more suggestive of hypertensive urgency so may be followed up by his primary care doctor if not read by the time patient is medically ready for discharge.    Lab Results  Component Value Date   CHOL 200 09/19/2012   HDL 42 09/19/2012   LDLCALC 122* 09/19/2012   TRIG 178* 09/19/2012   CHOLHDL 4.8 09/19/2012   Pineal gland cystic structure 1.5 x 1.5 x 0.9 cm with mild compression of the superior colliculus -  Spoke with Dr. Yetta Barre who recommends MRI with contrast of the pineal region -  If enhancing, will need NSGY consult -  If nonenhancing, will need repeat MRI in 3 months by PCP to see if enlarging or changing    Possible bulge of the M1 segment that is likely artifact.  Aneurysm less likely  HTN:   -  Continue lisinopril and HCTZ home medications -  Add labetalol BID -  Continue prn labetalol IV  Asthma, stable.  Continue prn albuterol  Diet:  Healthy heart Access:  PIV  IVF:  OFF Proph:  lovenox  Code Status: full Family Communication: spoke with patient alone Disposition Plan: pending results of MRI brain with contrast. If enhancement, may be tumor and needs NSGY consultation.  If not enhancing, may be discharged with follow up with PCP.  Repeat MRI in 3 months with contrast   Consultants:  none  Procedures:  CT head  MRI brain WO contrast  MRI brain W contrast  MRA brain  Carotid duplex  ECHO  Antibiotics:  none    HPI/Subjective:  When blood pressure was elevated, patient had HA and left arm tingling and blurry vision.  Currently symptom free.  Denies diplopia when looking up.     Objective: Filed Vitals:   09/18/12 2030 09/18/12 2115 09/19/12 0000 09/19/12 0400  BP: 172/120 162/99 154/97 157/99  Pulse: 81  80 75  Temp: 98.6 F (37 C)  98.7 F (37.1 C) 98.3 F (36.8 C)  TempSrc: Oral     Resp: 18  18 18   Height:      Weight:      SpO2: 96%  95% 96%   No intake or output data in the 24 hours ending 09/19/12 0852 Filed Weights   09/18/12 1844  Weight: 114.76 kg (253 lb)    Exam:   General:  Obese AAM, No acute distress  HEENT:  NCAT, MMM  Cardiovascular:  RRR, nl S1, S2 no mrg, 2+ pulses, warm extremities  Respiratory:  CTAB, no increased WOB  Abdomen:   NABS, soft, NT/ND  MSK:   Normal tone and bulk, no LEE  Neuro:  CN II-XII grossly intact, strength 5/5 throughout, sensation intact to light touch  Data Reviewed: Basic Metabolic Panel:  Recent Labs Lab 09/15/12 0638 09/18/12 1423  NA 138 141  K 3.8 4.2  CL 104 108  GLUCOSE 174* 169*  BUN 20 14  CREATININE 1.10 1.00   Liver Function  Tests: No results found for this basename: AST, ALT, ALKPHOS, BILITOT, PROT, ALBUMIN,  in the last 168 hours No results found for this basename: LIPASE, AMYLASE,  in the last 168 hours No results found for this basename: AMMONIA,  in the last 168 hours CBC:  Recent Labs Lab 09/15/12 0621 09/15/12 0638 09/18/12 1423  WBC 7.5  --   --   NEUTROABS 3.3  --   --   HGB 16.9 17.7* 16.0  HCT 47.2 52.0 47.0  MCV 88.6  --   --   PLT 214  --   --    Cardiac Enzymes:  Recent Labs Lab 09/18/12 1632 09/18/12 2228  TROPONINI <0.30 <0.30   BNP (last 3 results) No results found for this basename: PROBNP,  in the last 8760 hours CBG:  Recent Labs Lab 09/18/12 2154 09/19/12 0750  GLUCAP 100* 109*    No results found for this or any previous visit (from the past 240  hour(s)).   Studies: Ct Head Wo Contrast  09/18/2012   *RADIOLOGY REPORT*  Clinical Data: Headache, right arm numbness, TIA, history hypertension, asthma  CT HEAD WITHOUT CONTRAST  Technique:  Contiguous axial images were obtained from the base of the skull through the vertex without contrast.  Comparison: None  Findings: Normal ventricular morphology. No midline shift or mass effect. Normal appearance of brain parenchyma. No intracranial hemorrhage, mass lesion or evidence of acute infarction. No extra-axial fluid collections. Bones and sinuses unremarkable.  IMPRESSION: No acute intracranial abnormalities.   Original Report Authenticated By: Ulyses Southward, M.D.   Mri Brain Without Contrast  09/18/2012   *RADIOLOGY REPORT*  Clinical Data:  Left arm numbness for past week.  High blood pressure.  Left facial headaches.  MRI BRAIN WITHOUT CONTRAST MRA HEAD WITHOUT CONTRAST  Technique: Multiplanar, multiecho pulse sequences of the brain and surrounding structures were obtained according to standard protocol without intravenous contrast.  Angiographic images of the head were obtained using MRA technique without contrast.  Comparison: 09/18/2012 head CT.  No comparison brain MR.  MRI HEAD  Findings:  No acute infarct.  No intracranial hemorrhage.  No hydrocephalus.  Pineal gland is enlarged appearing cystic spanning over 1.5 x 1.5 x 0.9 cm.  Mild compression of the superior colliculus. This may represent an incidental finding assuming the patient does not have diplopia with upward gaze.  Dedicated thin section pre and postcontrast MR imaging to exclude worrisome enhancement can be performed at the present time as a baseline exam.  Minimal calcifications along the anterior margin on accompanying CT.  Decreased signal intensity bone marrow of the clivus and upper cervical spine may be normal for this patient.  Correlation with CBC to exclude anemia contributing to this appearance may be considered.  Exophthalmos.   IMPRESSION: No acute infarct.  Enlarged cystic-appearing pineal gland with follow up as noted above.  Decreased signal intensity bone marrow of the clivus and upper cervical spine may be normal for this patient.  Correlation with CBC to exclude anemia contributing to this appearance may be considered.  MRA HEAD  Findings: Anterior circulation without medium or large size vessel significant stenosis or occlusion.  Bulge superior margin of the M1 segment of the left middle cerebral artery appears to be an origin of a vessel on source sequence. Aneurysm felt a less likely consideration.  Stability can be confirmed on follow-up.  Middle cerebral artery mild branch vessel irregularity.  Ectatic vertebral arteries and basilar artery.  No high-grade stenosis.  Nonvisualization right PICA and left AICA.  Low-lying large right AICA.  Posterior cerebral artery and the superior cerebral artery mild branch vessel irregularity.  IMPRESSION: Anterior circulation without medium or large size vessel significant stenosis or occlusion.  Bulge superior margin of the M1 segment of the left middle cerebral artery appears to be an origin of a vessel on source sequence. Aneurysm felt a less likely consideration.  Stability can be confirmed on follow-up.  Ectatic vertebral arteries and basilar artery.  Nonvisualization right PICA and left AICA.  Low-lying large right AICA.   Original Report Authenticated By: Lacy Duverney, M.D.   Mr Mra Head/brain Wo Cm  09/18/2012   *RADIOLOGY REPORT*  Clinical Data:  Left arm numbness for past week.  High blood pressure.  Left facial headaches.  MRI BRAIN WITHOUT CONTRAST MRA HEAD WITHOUT CONTRAST  Technique: Multiplanar, multiecho pulse sequences of the brain and surrounding structures were obtained according to standard protocol without intravenous contrast.  Angiographic images of the head were obtained using MRA technique without contrast.  Comparison: 09/18/2012 head CT.  No comparison brain MR.   MRI HEAD  Findings:  No acute infarct.  No intracranial hemorrhage.  No hydrocephalus.  Pineal gland is enlarged appearing cystic spanning over 1.5 x 1.5 x 0.9 cm.  Mild compression of the superior colliculus. This may represent an incidental finding assuming the patient does not have diplopia with upward gaze.  Dedicated thin section pre and postcontrast MR imaging to exclude worrisome enhancement can be performed at the present time as a baseline exam.  Minimal calcifications along the anterior margin on accompanying CT.  Decreased signal intensity bone marrow of the clivus and upper cervical spine may be normal for this patient.  Correlation with CBC to exclude anemia contributing to this appearance may be considered.  Exophthalmos.  IMPRESSION: No acute infarct.  Enlarged cystic-appearing pineal gland with follow up as noted above.  Decreased signal intensity bone marrow of the clivus and upper cervical spine may be normal for this patient.  Correlation with CBC to exclude anemia contributing to this appearance may be considered.  MRA HEAD  Findings: Anterior circulation without medium or large size vessel significant stenosis or occlusion.  Bulge superior margin of the M1 segment of the left middle cerebral artery appears to be an origin of a vessel on source sequence. Aneurysm felt a less likely consideration.  Stability can be confirmed on follow-up.  Middle cerebral artery mild branch vessel irregularity.  Ectatic vertebral arteries and basilar artery.  No high-grade stenosis.  Nonvisualization right PICA and left AICA.  Low-lying large right AICA.  Posterior cerebral artery and the superior cerebral artery mild branch vessel irregularity.  IMPRESSION: Anterior circulation without medium or large size vessel significant stenosis or occlusion.  Bulge superior margin of the M1 segment of the left middle cerebral artery appears to be an origin of a vessel on source sequence. Aneurysm felt a less likely  consideration.  Stability can be confirmed on follow-up.  Ectatic vertebral arteries and basilar artery.  Nonvisualization right PICA and left AICA.  Low-lying large right AICA.   Original Report Authenticated By: Lacy Duverney, M.D.    Scheduled Meds: . aspirin  325 mg Oral Daily  . enoxaparin (LOVENOX) injection  40 mg Subcutaneous Q24H  . fluticasone  1 puff Inhalation BID  . lisinopril  20 mg Oral Daily   And  . hydrochlorothiazide  25 mg Oral Daily   Continuous Infusions:   Principal Problem:  TIA (transient ischemic attack) Active Problems:   Hypertension   Asthma    Time spent: 30 min    Cobey Raineri, [facility]  [facility] Pager [phone]. If 7PM-7AM, please contact night-coverage at [email or web address], password [facility] 09/19/2012, 8:52 AM  LOS: 1 day

## 2013-01-26 ENCOUNTER — Emergency Department (HOSPITAL_COMMUNITY): Payer: Self-pay

## 2013-01-26 ENCOUNTER — Emergency Department (HOSPITAL_COMMUNITY)
Admission: EM | Admit: 2013-01-26 | Discharge: 2013-01-26 | Disposition: A | Discharge status: Home / Self Care | Payer: Self-pay | Attending: Emergency Medicine | Admitting: Emergency Medicine

## 2013-01-26 ENCOUNTER — Encounter (HOSPITAL_COMMUNITY): Payer: Self-pay | Admitting: *Deleted

## 2013-01-26 DIAGNOSIS — F17200 Nicotine dependence, unspecified, uncomplicated: Secondary | ICD-10-CM | POA: Insufficient documentation

## 2013-01-26 DIAGNOSIS — I10 Essential (primary) hypertension: Secondary | ICD-10-CM | POA: Insufficient documentation

## 2013-01-26 DIAGNOSIS — Z79899 Other long term (current) drug therapy: Secondary | ICD-10-CM | POA: Insufficient documentation

## 2013-01-26 DIAGNOSIS — R059 Cough, unspecified: Secondary | ICD-10-CM | POA: Insufficient documentation

## 2013-01-26 DIAGNOSIS — J45909 Unspecified asthma, uncomplicated: Secondary | ICD-10-CM | POA: Insufficient documentation

## 2013-01-26 DIAGNOSIS — R05 Cough: Secondary | ICD-10-CM | POA: Insufficient documentation

## 2013-01-26 DIAGNOSIS — R079 Chest pain, unspecified: Secondary | ICD-10-CM | POA: Insufficient documentation

## 2013-01-26 DIAGNOSIS — R51 Headache: Secondary | ICD-10-CM | POA: Insufficient documentation

## 2013-01-26 LAB — PRO B NATRIURETIC PEPTIDE: Pro B Natriuretic peptide (BNP): 28.7 pg/mL (ref 0–125)

## 2013-01-26 LAB — CBC
HCT: 42 % (ref 39.0–52.0)
Hemoglobin: 15.5 g/dL (ref 13.0–17.0)
RBC: 4.65 MIL/uL (ref 4.22–5.81)
RDW: 12.3 % (ref 11.5–15.5)
WBC: 5.1 10*3/uL (ref 4.0–10.5)

## 2013-01-26 LAB — POCT I-STAT TROPONIN I
Troponin i, poc: 0 ng/mL (ref 0.00–0.08)
Troponin i, poc: 0 ng/mL (ref 0.00–0.08)

## 2013-01-26 LAB — BASIC METABOLIC PANEL
BUN: 13 mg/dL (ref 6–23)
CO2: 23 mEq/L (ref 19–32)
Calcium: 9.1 mg/dL (ref 8.4–10.5)
Chloride: 102 mEq/L (ref 96–112)
GFR calc Af Amer: >90 mL/min (ref >90)
Potassium: 3.8 mEq/L (ref 3.5–5.1)

## 2013-01-26 MED ORDER — ASPIRIN 325 MG PO TABS
325.0000 mg | ORAL_TABLET | Freq: Once | ORAL | Status: AC
  Administered 2013-01-26: 325 mg via ORAL
  Filled 2013-01-26: qty 1

## 2013-01-26 MED ORDER — NITROGLYCERIN 0.4 MG SL SUBL
0.4000 mg | SUBLINGUAL_TABLET | SUBLINGUAL | Status: AC
  Administered 2013-01-26: 0.4 mg via SUBLINGUAL

## 2013-01-26 MED ORDER — METOPROLOL TARTRATE 25 MG PO TABS: 25.0000 mg | ORAL_TABLET | Freq: Two times a day (BID) | ORAL | Status: DC

## 2013-01-26 NOTE — ED Provider Notes (Signed)
CSN: [id number]     Arrival date & time 01/26/13  0840 History   First MD Initiated Contact with Patient 01/26/13 [phone]     Chief Complaint  Patient presents with  . Hypertension  . Cough  . Chest Pain   (Consider location/radiation/quality/duration/timing/severity/associated sxs/prior Treatment) Patient is a 32 y.o. male presenting with hypertension, cough, and chest pain. The history is provided by the patient. No language interpreter was used.  Hypertension This is a recurrent problem. The current episode started more than 2 days ago. The problem occurs daily. The problem has not changed since onset.Associated symptoms include chest pain, headaches and shortness of breath. Pertinent negatives include no abdominal pain. Nothing aggravates the symptoms. The symptoms are relieved by rest. He has tried water (Rx meds) for the symptoms. The treatment provided mild relief.  Cough Cough characteristics:  Productive Sputum characteristics:  Clear and bloody (specs of BRB) Severity:  Moderate Onset quality:  Sudden Duration:  6 hours Progression:  Improving Chronicity:  New Smoker: yes   Context: not sick contacts and not upper respiratory infection   Relieved by:  Cough suppressants Worsened by:  Nothing tried Ineffective treatments:  None tried Associated symptoms: chest pain, headaches and shortness of breath   Associated symptoms: no chills, no diaphoresis, no fever, no myalgias, no rash, no rhinorrhea, no sinus congestion and no wheezing   Chest Pain Pain location:  Substernal area Pain quality: tightness   Pain radiates to:  Does not radiate Pain radiates to the back: no   Pain severity:  Moderate (10/10 at onset, now 5/10) Onset quality:  Sudden Duration:  6 hours Timing:  Constant Progression:  Improving Chronicity:  New Context comment:  While coughing Relieved by:  Nothing Worsened by:  Deep breathing Ineffective treatments:  None tried Associated symptoms: cough,  headache and shortness of breath   Associated symptoms: no abdominal pain, no anxiety, no back pain, no diaphoresis, no dizziness, no dysphagia, no fatigue, no fever, no lower extremity edema, no nausea, no near-syncope, no numbness, no palpitations, no syncope, not vomiting and no weakness   Risk factors: hypertension, male sex and smoking     Past Medical History  Diagnosis Date  . Hypertension   . Asthma    No past surgical history on file. Family History  Problem Relation Age of Onset  . Hypertension Father   . Hypertension Mother    History  Substance Use Topics  . Smoking status: Heavy Tobacco Smoker -- 0.50 packs/day    Types: Cigarettes  . Smokeless tobacco: Not on file  . Alcohol Use: No     Comment: Socially Only    Review of Systems  Constitutional: Negative for fever, chills, diaphoresis, activity change, appetite change and fatigue.  HENT: Negative for congestion, facial swelling, rhinorrhea and trouble swallowing.   Eyes: Negative for photophobia and pain.  Respiratory: Positive for cough and shortness of breath. Negative for chest tightness and wheezing.   Cardiovascular: Positive for chest pain. Negative for palpitations, leg swelling, syncope and near-syncope.  Gastrointestinal: Negative for nausea, vomiting, abdominal pain, diarrhea and constipation.  Endocrine: Negative for polydipsia and polyuria.  Genitourinary: Negative for dysuria, urgency, decreased urine volume and difficulty urinating.  Musculoskeletal: Negative for myalgias, back pain and gait problem.  Skin: Negative for color change, rash and wound.  Allergic/Immunologic: Negative for immunocompromised state.  Neurological: Positive for headaches. Negative for dizziness, facial asymmetry, speech difficulty, weakness and numbness.  Psychiatric/Behavioral: Negative for confusion, decreased concentration and agitation.  Allergies  Review of patient's allergies indicates no known allergies.  Home  Medications   Current Outpatient Rx  Name  Route  Sig  Dispense  Refill  . albuterol (PROVENTIL HFA;VENTOLIN HFA) 108 (90 BASE) MCG/ACT inhaler   Inhalation   Inhale 2 puffs into the lungs daily.          Marland Kitchen albuterol (PROVENTIL) (2.5 MG/3ML) 0.083% nebulizer solution   Nebulization   Take 2.5 mg by nebulization every 6 (six) hours as needed for wheezing.         . fluticasone (FLOVENT HFA) 110 MCG/ACT inhaler   Inhalation   Inhale 1 puff into the lungs 2 (two) times daily as needed (for wheezing).          Marland Kitchen lisinopril-hydrochlorothiazide (PRINZIDE,ZESTORETIC) 20-25 MG per tablet   Oral   Take 2 tablets by mouth daily.   60 tablet   1   . metoprolol tartrate (LOPRESSOR) 25 MG tablet   Oral   Take 1 tablet (25 mg total) by mouth 2 (two) times daily.   60 tablet   1    BP 158/98  Pulse 65  Temp(Src) 98.2 F (36.8 C) (Oral)  Resp 14  SpO2 95% Physical Exam  Constitutional: He is oriented to person, place, and time. He appears well-developed and well-nourished. No distress.  HENT:  Head: Normocephalic and atraumatic.  Mouth/Throat: No oropharyngeal exudate.  Eyes: Pupils are equal, round, and reactive to light.  Neck: Normal range of motion. Neck supple.  Cardiovascular: Normal rate, regular rhythm and normal heart sounds.  Exam reveals no gallop and no friction rub.   No murmur heard. Pulmonary/Chest: Effort normal and breath sounds normal. No respiratory distress. He has no wheezes. He has no rales. He exhibits tenderness.  Abdominal: Soft. Bowel sounds are normal. He exhibits no distension and no mass. There is no tenderness. There is no rebound and no guarding.  Musculoskeletal: Normal range of motion. He exhibits no edema and no tenderness.  Neurological: He is alert and oriented to person, place, and time.  Skin: Skin is warm and dry.  Psychiatric: He has a normal mood and affect.    ED Course  Procedures (including critical care time) Labs Review Labs  Reviewed  CBC - Abnormal; Notable for the following:    MCHC 36.9 (*)    All other components within normal limits  BASIC METABOLIC PANEL - Abnormal; Notable for the following:    Glucose, Bld 112 (*)    All other components within normal limits  PRO B NATRIURETIC PEPTIDE  POCT I-STAT TROPONIN I  POCT I-STAT TROPONIN I   Imaging Review Dg Chest 2 View  01/26/2013   CLINICAL DATA:  Hypertension with cough and chest pain  EXAM: CHEST  2 VIEW  COMPARISON:  Sep 15, 2012  FINDINGS: The lungs are clear. Heart size and pulmonary vascularity are normal. No adenopathy. No bone lesions. There is upper thoracic levoscoliosis.  IMPRESSION: No edema or consolidation.   Electronically Signed   By: Bretta Bang   On: 01/26/2013 09:17     Date: 01/26/2013  Rate: 90  Rhythm: normal sinus rhythm  QRS Axis: normal  Intervals: normal  ST/T Wave abnormalities: normal  Conduction Disutrbances: none  Narrative Interpretation: unremarkable    MDM   1. Hypertension   2. Chest pain    Pt is a 32 y.o. male with Pmhx as above who presents with intermittent h/a for 4 days with elevated BP, now this morning  with central chest tight and episode of coughing with specs of BRB.  On PE, T 99.4, BP elevated at 172/113.  Cardiopulm exam benign.  Neuro exam benign.  EKG unchanged from prior. Pt admitted 5/'13 for TIA/HTN, found to have possible apical hypokinesis, and benign appearing pineal cyst.  He was started on lopressor & prinzide, asked to f/u with PCP.  He has not yet been able to establish with one and has not been able to have stress testing as directed.  CXR unremarkable, trop neg x2.  Pt's h/a, CP, and BP improved after 1 SL NTG.  I doubt hypertensive emergency, ACS, but do feel pt needs stress testing as outpt.   1:26 PM Pt has PCP appt scheduled by Oct 6th by care coordinator, will sign up for orange card at that time as well.   cardiology will set up pt for stress testing this week.   1.  Hypertension   2. Chest pain         Shanna Cisco, MD 01/26/13 [phone]

## 2013-01-26 NOTE — ED Notes (Signed)
EKG given to Dr. J 

## 2013-01-26 NOTE — ED Notes (Signed)
Pt is here with fluctuating blood pressures, cough with some blood, chest pain.

## 2013-01-26 NOTE — Discharge Planning (Signed)
[facility]  Patient is to follow up with the [facility] [facility] on [address]. February 04, 2013 at 2:00pm to see a doctor and 3:00pm to enroll for the orange card. Patient is aware of the appointments, application and my contact information was given for any questions or concerns.

## 2013-01-27 ENCOUNTER — Telehealth (HOSPITAL_COMMUNITY): Payer: Self-pay | Admitting: Emergency Medicine

## 2013-01-27 NOTE — ED Notes (Signed)
Pt calling and stating we need to put outpatient order for stress test w/Juana Diaz in for him.  I read ED MD providers note and it says [facility] will contact him this week to set up stress test.  Pt informed.

## 2013-02-02 ENCOUNTER — Ambulatory Visit: Payer: Self-pay

## 2013-02-04 ENCOUNTER — Ambulatory Visit: Payer: No Typology Code available for payment source | Attending: Internal Medicine | Admitting: Internal Medicine

## 2013-02-04 ENCOUNTER — Ambulatory Visit: Payer: Self-pay | Attending: Internal Medicine

## 2013-02-04 VITALS — BP 181/127 | HR 74 | Temp 98.0°F | Resp 16 | Wt 242.0 lb

## 2013-02-04 DIAGNOSIS — G459 Transient cerebral ischemic attack, unspecified: Secondary | ICD-10-CM

## 2013-02-04 DIAGNOSIS — I10 Essential (primary) hypertension: Secondary | ICD-10-CM | POA: Insufficient documentation

## 2013-02-04 MED ORDER — LISINOPRIL-HYDROCHLOROTHIAZIDE 20-25 MG PO TABS: 2.0000 | ORAL_TABLET | Freq: Every day | ORAL | Status: DC

## 2013-02-04 MED ORDER — METOPROLOL TARTRATE 25 MG PO TABS: 25.0000 mg | ORAL_TABLET | Freq: Two times a day (BID) | ORAL | Status: DC

## 2013-02-04 NOTE — Patient Instructions (Signed)
Transient Ischemic Attack A transient ischemic attack (TIA) is a "warning stroke" that causes stroke-like symptoms. Unlike a stroke, a TIA does not cause permanent damage to the brain. The symptoms of a TIA can happen very fast and do not last long. It is important to know the symptoms of a TIA and what to do. This can help prevent a major stroke or death. CAUSES   A TIA is caused by a temporary blockage in an artery in the brain or neck (carotid artery). The blockage does not allow the brain to get the blood supply it needs and can cause different symptoms. The blockage can be caused by either:  A blood clot.  Fatty buildup (plaque) in a neck or brain artery. RISK FACTORS  High blood pressure (hypertension).  High cholesterol.  Diabetes mellitus.  Heart disease.  The build up of plaque in the blood vessels (peripheral artery disease or atherosclerosis).  The build up of plaque in the blood vessels providing blood and oxygen to the brain (carotid artery stenosis).  An abnormal heart rhythm (atrial fibrillation).  Obesity.  Smoking.  Taking oral contraceptives (especially in combination with smoking).  Physical inactivity.  A diet high in fats, salt (sodium), and calories.  Alcohol use.  Use of illegal drugs (especially cocaine and methamphetamine).  Being male.  Being African American.  Being over the age of 70.  Family history of stroke.  Previous history of blood clots, stroke, TIA, or heart attack.  Sickle cell disease. SYMPTOMS  TIA symptoms are the same as a stroke but are temporary. These symptoms usually develop suddenly, or may be newly present upon awakening from sleep:  Sudden weakness or numbness of the face, arm, or leg, especially on one side of the body.  Sudden trouble walking or difficulty moving arms or legs.  Sudden confusion.  Sudden personality changes.  Trouble speaking (aphasia) or understanding.  Difficulty swallowing.  Sudden  trouble seeing in one or both eyes.  Double vision.  Dizziness.  Loss of balance or coordination.  Sudden severe headache with no known cause.  Trouble reading or writing.  Loss of bowel or bladder control.  Loss of consciousness. DIAGNOSIS  Your caregiver may be able to determine the presence or absence of a TIA based on your symptoms, history, and physical exam. Computed tomography (CT scan) of the brain is usually performed to help identify a TIA. Other tests may be done to diagnose a TIA. These tests may include:  Electrocardiography.  Continuous heart monitoring.  Echocardiography.  Carotid ultrasonography.  Magnetic resonance imaging (MRI).  A scan of the brain circulation.  Blood tests. PREVENTION  The risk of a TIA can be decreased by appropriately treating high blood pressure, high cholesterol, diabetes, heart disease, and obesity and by quitting smoking, limiting alcohol, and staying physically active. TREATMENT  Time is of the essence. Since the symptoms of TIA are the same as a stroke, it is important to seek treatment within 3 4 hours of the start of symptoms because you may receive a medicine to dissolve the clot (thrombolytic) that cannot be given after that time. Treatment options vary. Treatment options may include rest, oxygen, intravenous (IV) fluids, and medicines to thin the blood (anticoagulants). Medicines and diet may be used to address diabetes, high blood pressure, and other risk factors. Measures will be taken to prevent short-term and long-term complications, including infection from breathing foreign material into the lungs (aspiration pneumonia), blood clots in the legs, and falls. Treatment options  include procedures to either remove plaque in the carotid arteries or dilate carotid arteries that have narrowed due to plaque. Those procedures are:  Carotid endarterectomy.  Carotid angioplasty and stenting. HOME CARE INSTRUCTIONS   Take all  medicines prescribed by your caregiver. Follow the directions carefully. Medicines may be used to control risk factors for a stroke. Be sure you understand all your medicine instructions.  You may be told to take aspirin or the anticoagulant warfarin. Warfarin needs to be taken exactly as instructed.  Taking too much or too little warfarin is dangerous. Too much warfarin increases the risk of bleeding. Too little warfarin continues to allow the risk for blood clots. While taking warfarin, you will need to have regular blood tests to measure your blood clotting time. A PT blood test measures how long it takes for blood to clot. Your PT is used to calculate another value called an INR. Your PT and INR help your caregiver to adjust your dose of warfarin. The dose can change for many reasons. It is critically important that you take warfarin exactly as prescribed.  Many foods, especially foods high in vitamin K can interfere with warfarin and affect the PT and INR. Foods high in vitamin K include spinach, kale, broccoli, cabbage, collard and turnip greens, brussels sprouts, peas, cauliflower, seaweed, and parsley as well as beef and pork liver, green tea, and soybean oil. You should eat a consistent amount of foods high in vitamin K. Avoid major changes in your diet, or notify your caregiver before changing your diet. Arrange a visit with a dietitian to answer your questions.  Many medicines can interfere with warfarin and affect the PT and INR. You must tell your caregiver about any and all medicines you take, this includes all vitamins and supplements. Be especially cautious with aspirin and anti-inflammatory medicines. Do not take or discontinue any prescribed or over-the-counter medicine except on the advice of your caregiver or pharmacist.  Warfarin can have side effects, such as excessive bruising or bleeding. You will need to hold pressure over cuts for longer than usual. Your caregiver or pharmacist  will discuss other potential side effects.  Avoid sports or activities that may cause injury or bleeding.  Be mindful when shaving, flossing your teeth, or handling sharp objects.  Alcohol can change the body's ability to handle warfarin. It is best to avoid alcoholic drinks or consume only very small amounts while taking warfarin. Notify your caregiver if you change your alcohol intake.  Notify your dentist or other caregivers before procedures.  Eat a diet that includes 5 or more servings of fruits and vegetables each day. This may reduce the risk of stroke. Certain diets may be prescribed to address high blood pressure, high cholesterol, diabetes, or obesity.  A low-sodium, low-saturated fat, low-trans fat, low-cholesterol diet is recommended to manage high blood pressure.  A low-saturated fat, low-trans fat, low-cholesterol, and high-fiber diet may control cholesterol levels.  A controlled-carbohydrate, controlled-sugar diet is recommended to manage diabetes.  A reduced-calorie, low-sodium, low-saturated fat, low-trans fat, low-cholesterol diet is recommended to manage obesity.  Maintain a healthy weight.  Stay physically active. It is recommended that you get at least 30 minutes of activity on most or all days.  Do not smoke.  Limit alcohol use even if you are not taking warfarin. Moderate alcohol use is considered to be:  No more than 2 drinks each day for men.  No more than 1 drink each day for nonpregnant women.  Stop  drug abuse.  Home safety. A safe home environment is important to reduce the risk of falls. Your caregiver may arrange for specialists to evaluate your home. Having grab bars in the bedroom and bathroom is often important. Your caregiver may arrange for equipment to be used at home, such as raised toilets and a seat for the shower.  Follow all instructions for follow-up with your caregiver. This is very important. This includes any referrals and lab tests.  Proper follow up can prevent a stroke or another TIA from occurring. SEEK MEDICAL CARE IF:  You have personality changes.  You have difficulty swallowing.  You are seeing double.  You have dizziness.  You have a fever.  You have skin breakdown. SEEK IMMEDIATE MEDICAL CARE IF:  Any of these symptoms may represent a serious problem that is an emergency. Do not wait to see if the symptoms will go away. Get medical help right away. Call your local emergency services (911 in [place] Do not drive yourself to the hospital.  You have sudden weakness or numbness of the face, arm, or leg, especially on one side of the body.  You have sudden trouble walking or difficulty moving arms or legs.  You have sudden confusion.  You have trouble speaking (aphasia) or understanding.  You have sudden trouble seeing in one or both eyes.  You have a loss of balance or coordination.  You have a sudden, severe headache with no known cause.  You have new chest pain or an irregular heartbeat.  You have a partial or total loss of consciousness. MAKE SURE YOU:   Understand these instructions.  Will watch your condition.  Will get help right away if you are not doing well or get worse. Document Released: 01/24/2005 Document Revised: 04/02/2012 Document Reviewed: 06/09/2009 [facility] Patient Information 2014 [place], [place]. Hypertension As your heart beats, it forces blood through your arteries. This force is your blood pressure. If the pressure is too high, it is called hypertension (HTN) or high blood pressure. HTN is dangerous because you may have it and not know it. High blood pressure may mean that your heart has to work harder to pump blood. Your arteries may be narrow or stiff. The extra work puts you at risk for heart disease, stroke, and other problems.  Blood pressure consists of two numbers, a higher number over a lower, 110/72, for example. It is stated as "110 over 72." The ideal is below  120 for the top number (systolic) and under 80 for the bottom (diastolic). Write down your blood pressure today. You should pay close attention to your blood pressure if you have certain conditions such as:  Heart failure.  Prior heart attack.  Diabetes  Chronic kidney disease.  Prior stroke.  Multiple risk factors for heart disease. To see if you have HTN, your blood pressure should be measured while you are seated with your arm held at the level of the heart. It should be measured at least twice. A one-time elevated blood pressure reading (especially in the Emergency Department) does not mean that you need treatment. There may be conditions in which the blood pressure is different between your right and left arms. It is important to see your caregiver soon for a recheck. Most people have essential hypertension which means that there is not a specific cause. This type of high blood pressure may be lowered by changing lifestyle factors such as:  Stress.  Smoking.  Lack of exercise.  Excessive weight.  Drug/tobacco/alcohol use.  Eating less salt. Most people do not have symptoms from high blood pressure until it has caused damage to the body. Effective treatment can often prevent, delay or reduce that damage. TREATMENT  When a cause has been identified, treatment for high blood pressure is directed at the cause. There are a large number of medications to treat HTN. These fall into several categories, and your caregiver will help you select the medicines that are best for you. Medications may have side effects. You should review side effects with your caregiver. If your blood pressure stays high after you have made lifestyle changes or started on medicines,   Your medication(s) may need to be changed.  Other problems may need to be addressed.  Be certain you understand your prescriptions, and know how and when to take your medicine.  Be sure to follow up with your caregiver  within the time frame advised (usually within two weeks) to have your blood pressure rechecked and to review your medications.  If you are taking more than one medicine to lower your blood pressure, make sure you know how and at what times they should be taken. Taking two medicines at the same time can result in blood pressure that is too low. SEEK IMMEDIATE MEDICAL CARE IF:  You develop a severe headache, blurred or changing vision, or confusion.  You have unusual weakness or numbness, or a faint feeling.  You have severe chest or abdominal pain, vomiting, or breathing problems. MAKE SURE YOU:   Understand these instructions.  Will watch your condition.  Will get help right away if you are not doing well or get worse. Document Released: 04/16/2005 Document Revised: 07/09/2011 Document Reviewed: 12/05/2007 [facility] Patient Information 2014 [place], [place].

## 2013-02-04 NOTE — Progress Notes (Signed)
Patient ID: Joseph Shannon, male   DOB: 1980/05/27, 32 y.o.   MRN: [id number]  CC: follow up  HPI: 32 year old male with past medical history of hypertensive urgency who presented to clinic for followup. Patient ran out of the medications for past 3 days and reports he needed to followup in clinic for refills. He has no current complaints. Occasionally he has numbness and tingling in left arm he reported in past couple of weeks one episode. No chest pains. No shortness of breath. No headaches or blurry vision.  No Known Allergies Past Medical History  Diagnosis Date  . Hypertension   . Asthma    Current Outpatient Prescriptions on File Prior to Visit  Medication Sig Dispense Refill  . albuterol (PROVENTIL HFA;VENTOLIN HFA) 108 (90 BASE) MCG/ACT inhaler Inhale 2 puffs into the lungs daily.       Marland Kitchen albuterol (PROVENTIL) (2.5 MG/3ML) 0.083% nebulizer solution Take 2.5 mg by nebulization every 6 (six) hours as needed for wheezing.      . fluticasone (FLOVENT HFA) 110 MCG/ACT inhaler Inhale 1 puff into the lungs 2 (two) times daily as needed (for wheezing).        Current Facility-Administered Medications on File Prior to Visit  Medication Dose Route Frequency Provider Last Rate Last Dose  . nitroGLYCERIN (NITROSTAT) SL tablet 0.4 mg  0.4 mg Sublingual Once Acey Lav, MD       Family History  Problem Relation Age of Onset  . Hypertension Father   . Hypertension Mother    History   Social History  . Marital Status: Single    Spouse Name: N/A    Number of Children: N/A  . Years of Education: N/A   Occupational History  . Not on file.   Social History Main Topics  . Smoking status: Heavy Tobacco Smoker -- 0.50 packs/day    Types: Cigarettes  . Smokeless tobacco: Not on file  . Alcohol Use: No     Comment: Socially Only  . Drug Use: No  . Sexual Activity: Not on file   Other Topics Concern  . Not on file   Social History Narrative  . No narrative on file    Review  of Systems  Constitutional: Negative for fever, chills, diaphoresis, activity change, appetite change and fatigue.  HENT: Negative for ear pain, nosebleeds, congestion, facial swelling, rhinorrhea, neck pain, neck stiffness and ear discharge.   Eyes: Negative for pain, discharge, redness, itching and visual disturbance.  Respiratory: Negative for cough, choking, chest tightness, shortness of breath, wheezing and stridor.   Cardiovascular: Negative for chest pain, palpitations and leg swelling.  Gastrointestinal: Negative for abdominal distention.  Genitourinary: Negative for dysuria, urgency, frequency, hematuria, flank pain, decreased urine volume, difficulty urinating and dyspareunia.  Musculoskeletal: Negative for back pain, joint swelling, arthralgias and gait problem.  Neurological: Negative for dizziness, tremors, seizures, syncope, facial asymmetry, speech difficulty, weakness, light-headedness, numbness and headaches.  Hematological: Negative for adenopathy. Does not bruise/bleed easily.  Psychiatric/Behavioral: Negative for hallucinations, behavioral problems, confusion, dysphoric mood, decreased concentration and agitation.    Objective:   Filed Vitals:   02/04/13 1407  BP: 181/127  Pulse: 74  Temp: 98 F (36.7 C)  Resp: 16    Physical Exam  Constitutional: Appears well-developed and well-nourished. No distress.  HENT: Normocephalic. External right and left ear normal. Oropharynx is clear and moist.  Eyes: Conjunctivae and EOM are normal. PERRLA, no scleral icterus.  Neck: Normal ROM. Neck supple. No JVD. No tracheal deviation.  No thyromegaly.  CVS: RRR, S1/S2 +, no murmurs, no gallops, no carotid bruit.  Pulmonary: Effort and breath sounds normal, no stridor, rhonchi, wheezes, rales.  Abdominal: Soft. BS +,  no distension, tenderness, rebound or guarding.  Musculoskeletal: Normal range of motion. No edema and no tenderness.  Lymphadenopathy: No lymphadenopathy noted,  cervical, inguinal. Neuro: Alert. Normal reflexes, muscle tone coordination. No cranial nerve deficit. Skin: Skin is warm and dry. No rash noted. Not diaphoretic. No erythema. No pallor.  Psychiatric: Normal mood and affect. Behavior, judgment, thought content normal.   Lab Results  Component Value Date   WBC 5.1 01/26/2013   HGB 15.5 01/26/2013   HCT 42.0 01/26/2013   MCV 90.3 01/26/2013   PLT 211 01/26/2013   Lab Results  Component Value Date   CREATININE 0.98 01/26/2013   BUN 13 01/26/2013   NA 137 01/26/2013   K 3.8 01/26/2013   CL 102 01/26/2013   CO2 23 01/26/2013    Lab Results  Component Value Date   HGBA1C 5.9* 09/18/2012   Lipid Panel     Component Value Date/Time   CHOL 200 09/19/2012 0600   TRIG 178* 09/19/2012 0600   HDL 42 09/19/2012 0600   CHOLHDL 4.8 09/19/2012 0600   VLDL 36 09/19/2012 0600   LDLCALC 122* 09/19/2012 0600       Assessment and plan:   Patient Active Problem List   Diagnosis Date Noted  . Hypertension     Priority: High - We have discussed target BP range - I have advised pt to check BP regularly and to call us back if the numbers are higher than 140/90 - discussed the importance of compliance with medical therapy and diet  - Patient did not take medications for past 3 days, he ran out. Prescriptions provided and refills   . TIA (transient ischemic attack) 09/18/2012    Priority: Medium - now stable - not on aspirin due to questionable cyst/aneurysm on MRI brain in 08/2012. When pt has follow up repeat MRI brain then will reassess

## 2013-02-04 NOTE — Progress Notes (Signed)
Patient here for follow up - HTN Today presents with elevated blood pressure Has not had his medication in about 3 days

## 2013-02-19 ENCOUNTER — Encounter: Payer: Self-pay | Admitting: Cardiology

## 2013-02-19 ENCOUNTER — Ambulatory Visit (INDEPENDENT_AMBULATORY_CARE_PROVIDER_SITE_OTHER): Payer: No Typology Code available for payment source | Admitting: Cardiology

## 2013-02-19 VITALS — BP 152/98 | HR 88 | Ht 74.5 in | Wt 247.0 lb

## 2013-02-19 DIAGNOSIS — R079 Chest pain, unspecified: Secondary | ICD-10-CM

## 2013-02-19 DIAGNOSIS — E785 Hyperlipidemia, unspecified: Secondary | ICD-10-CM

## 2013-02-19 MED ORDER — AMLODIPINE BESYLATE 5 MG PO TABS: 5.0000 mg | ORAL_TABLET | Freq: Every day | ORAL | Status: DC

## 2013-02-19 MED ORDER — METOPROLOL TARTRATE 50 MG PO TABS: 50.0000 mg | ORAL_TABLET | Freq: Two times a day (BID) | ORAL | Status: DC

## 2013-02-19 MED ORDER — NITROGLYCERIN 0.4 MG SL SUBL: 0.4000 mg | SUBLINGUAL_TABLET | SUBLINGUAL | Status: DC | PRN

## 2013-02-19 NOTE — Progress Notes (Signed)
Patient ID: Joseph Shannon, male   DOB: 12-30-1980, 32 y.o.   MRN: [id number]    Patient Name: Joseph Shannon Date of Encounter: 02/19/2013  Primary Care Provider:  No PCP Per Patient Primary Cardiologist:  Joseph Shannon, H  Patient Profile  Posthospitalization visit  Problem List   Past Medical History  Diagnosis Date  . Hypertension   . Asthma    No past surgical history on file.  Allergies  No Known Allergies  HPI  32 year old gentleman with history of hypertension and asthma who was seen in the emergency department for complaints of cough and chest pain and left sided weakness.   In May 2014 the patient presented with intermittent h/a for 4 days with elevated BP, but progressed to central chest tight and episode of coughing, BP elevated at 172/113. Cardiopulm exam benign. Neuro exam benign. EKG unchanged from prior. Pt admitted 5/'13 for TIA/HTN, found to have possible apical hypokinesis, and benign appearing pineal cyst. He was started on lopressor & ACE inhibitor, asked to f/u with PCP. He has not yet been able to establish with one and has not been able to have stress testing as directed. CXR unremarkable, trop neg x2. Pt's h/a, CP, and BP improved after 1 SL NTG.   The patient is coming here today and he states that he has just been almost daily, retrosternal pressure-like nonexertional but sometimes they feel like they're relieved by rest, he hasn't tried to use nitroglycerin with it.   The patient is very concerned as his mom with an early 32s just had a major myocardial infarction. His cousin just had a left ventricular assist device placed heart failure, the patient is not aware of etiology.  Home Medications  Prior to Admission medications   Medication Sig Start Date End Date Taking? Authorizing Provider  albuterol (PROVENTIL HFA;VENTOLIN HFA) 108 (90 BASE) MCG/ACT inhaler Inhale 2 puffs into the lungs daily.    Yes Historical Provider, MD  albuterol  (PROVENTIL) (2.5 MG/3ML) 0.083% nebulizer solution Take 2.5 mg by nebulization every 6 (six) hours as needed for wheezing.   Yes Historical Provider, MD  fluticasone (FLOVENT HFA) 110 MCG/ACT inhaler Inhale 1 puff into the lungs 2 (two) times daily as needed (for wheezing).    Yes Historical Provider, MD  lisinopril-hydrochlorothiazide (PRINZIDE,ZESTORETIC) 20-25 MG per tablet Take 2 tablets by mouth daily. 02/04/13  Yes Alison Murray, MD  metoprolol tartrate (LOPRESSOR) 25 MG tablet Take 1 tablet (25 mg total) by mouth 2 (two) times daily. 02/04/13  Yes Alison Murray, MD    Family History  Family History  Problem Relation Age of Onset  . Hypertension Father   . Hypertension Mother     Social History  History   Social History  . Marital Status: Single    Spouse Name: N/A    Number of Children: N/A  . Years of Education: N/A   Occupational History  . Not on file.   Social History Main Topics  . Smoking status: Heavy Tobacco Smoker -- 0.50 packs/day    Types: Cigarettes  . Smokeless tobacco: Not on file  . Alcohol Use: No     Comment: Socially Only  . Drug Use: No  . Sexual Activity: Not on file   Other Topics Concern  . Not on file   Social History Narrative  . No narrative on file     Review of Systems, as per history of present illness General:  No chills, fever, night sweats or weight  changes.  Cardiovascular:  No chest pain, dyspnea on exertion, edema, orthopnea, palpitations, paroxysmal nocturnal dyspnea. Dermatological: No rash, lesions/masses Respiratory: No cough, dyspnea Urologic: No hematuria, dysuria Abdominal:   No nausea, vomiting, diarrhea, bright red blood per rectum, melena, or hematemesis Neurologic:  No visual changes, wkns, changes in mental status. All other systems reviewed and are otherwise negative except as noted above.  Physical Exam  Blood pressure 152/98, pulse 88, height 6' 2.5" (1.892 m), weight 247 lb (112.038 kg).  General: Pleasant,  NAD Psych: Normal affect. Neuro: Alert and oriented X 3. Moves all extremities spontaneously. HEENT: Normal  Neck: Supple without bruits or JVD. Lungs:  Resp regular and unlabored, CTA. Heart: RRR no s3, s4, or murmurs. Abdomen: Soft, non-tender, non-distended, BS + x 4.  Extremities: No clubbing, cyanosis or edema. DP/PT/Radials 2+ and equal bilaterally.  Accessory Clinical Findings  ECG - sinus rhythm 88 beats per minute, normal EKG  Lipid Panel     Component Value Date/Time   CHOL 200 09/19/2012 0600   TRIG 178* 09/19/2012 0600   HDL 42 09/19/2012 0600   CHOLHDL 4.8 09/19/2012 0600   VLDL 36 09/19/2012 0600   LDLCALC 122* 09/19/2012 0600   Duplex carotids; 09/19/12 Summary:  - No significant extracranial carotid artery stenosis demonstrated. Vertebrals are patent with antegrade flow. -  TTE 09/19/12 Study Conclusions  - Left ventricle: The cavity size was normal. Wall thickness was increased in a pattern of mild LVH. Systolic function was normal. The estimated ejection fraction was in the range of 50% to 55%. Possible hypokinesis of the apical myocardium. Left ventricular diastolic function parameters were normal. - Left atrium: The atrium was mildly dilated. Impressions:  - Possible small area of apical hypokinesis; suggest fu study with contrast to further evaluate if clinically indicated.   Assessment & Plan  32 year old gentleman with a history of hypertension, possible TIA, pineal cyst, with significant family history of CAD with coming for concerns of chest pain.  1. Chest pain - The patient has a possible hypokinetic apex on transthoracic echocardiogram. We will order an exercise nuclear stress test. Continue ACE inhibitor and that the blocker, check lipid profile and CMP. Prescribed sublingual nitroglycerin 0.4 mg when necessary  2. Hypertension - uncontrolled, increase metoprolol to 50 by mouth twice a day, continue lisinopril 20 mg daily 25 mg of  hydrochlorothiazide, amlodipine 5 mg daily  3. Palpitations, history of TIA, mildly dilated left atrium - we will start patient on a cardio heart monitor to evaluate for possible atrial fibrillation as a cause for his TIA.  Followup in 2 weeks.   Joseph Shannon, Rexene Edison, MD 02/19/2013, 2:55 PM

## 2013-02-19 NOTE — Addendum Note (Signed)
Addended by: Demetrios Loll on: 02/19/2013 03:54 PM   Modules accepted: Orders

## 2013-02-19 NOTE — Patient Instructions (Addendum)
Your physician has recommended that you wear an event monitor for 14 days. Event monitors are medical devices that record the heart's electrical activity. Doctors most often [place] these monitors to diagnose arrhythmias. Arrhythmias are problems with the speed or rhythm of the heartbeat. The monitor is a small, portable device. You can wear one while you do your normal daily activities. This is usually used to diagnose what is causing palpitations/syncope (passing out).  Your physician has requested that you have en exercise stress myoview. For further information please visit [email or web address] Please follow instruction sheet, as given.  Your physician has recommended you make the following change in your medication: increase Metoprolol to 50 mg twice daily, start taking Amlodipine 5 mg daily and start taking Nitroglycerin as needed for chest pain. Please follow instructions given to you at today's office visit and on medication bottle.  Your physician recommends that you return for lab work in:  On same day as your stress test. Please do not eat or drink after midnight the night before labs are to be drawn. You may drink enough water to take your morning medications.  Your physician recommends that you schedule a follow-up appointment in: 2 weeks

## 2013-02-20 ENCOUNTER — Other Ambulatory Visit: Payer: Self-pay | Admitting: *Deleted

## 2013-02-20 MED ORDER — AMLODIPINE BESYLATE 5 MG PO TABS: 5.0000 mg | ORAL_TABLET | Freq: Every day | ORAL | Status: DC

## 2013-02-20 MED ORDER — NITROGLYCERIN 0.4 MG SL SUBL: 0.4000 mg | SUBLINGUAL_TABLET | SUBLINGUAL | Status: DC | PRN

## 2013-02-20 MED ORDER — METOPROLOL TARTRATE 50 MG PO TABS: 50.0000 mg | ORAL_TABLET | Freq: Two times a day (BID) | ORAL | Status: DC

## 2013-02-25 ENCOUNTER — Encounter (INDEPENDENT_AMBULATORY_CARE_PROVIDER_SITE_OTHER): Payer: Self-pay

## 2013-02-25 ENCOUNTER — Telehealth: Payer: Self-pay | Admitting: Cardiology

## 2013-02-25 ENCOUNTER — Ambulatory Visit (HOSPITAL_COMMUNITY): Payer: No Typology Code available for payment source | Attending: Cardiology | Admitting: Radiology

## 2013-02-25 ENCOUNTER — Encounter: Payer: Self-pay | Admitting: *Deleted

## 2013-02-25 VITALS — BP 146/72 | HR 86 | Ht 74.0 in | Wt 243.0 lb

## 2013-02-25 DIAGNOSIS — R079 Chest pain, unspecified: Secondary | ICD-10-CM | POA: Insufficient documentation

## 2013-02-25 DIAGNOSIS — I10 Essential (primary) hypertension: Secondary | ICD-10-CM | POA: Insufficient documentation

## 2013-02-25 DIAGNOSIS — R9439 Abnormal result of other cardiovascular function study: Secondary | ICD-10-CM | POA: Insufficient documentation

## 2013-02-25 DIAGNOSIS — Z8249 Family history of ischemic heart disease and other diseases of the circulatory system: Secondary | ICD-10-CM | POA: Insufficient documentation

## 2013-02-25 DIAGNOSIS — F17200 Nicotine dependence, unspecified, uncomplicated: Secondary | ICD-10-CM | POA: Insufficient documentation

## 2013-02-25 DIAGNOSIS — Z8673 Personal history of transient ischemic attack (TIA), and cerebral infarction without residual deficits: Secondary | ICD-10-CM | POA: Insufficient documentation

## 2013-02-25 DIAGNOSIS — J45909 Unspecified asthma, uncomplicated: Secondary | ICD-10-CM | POA: Insufficient documentation

## 2013-02-25 MED ORDER — TECHNETIUM TC 99M SESTAMIBI GENERIC - CARDIOLITE
33.0000 | Freq: Once | INTRAVENOUS | Status: AC | PRN
  Administered 2013-02-25: 33 via INTRAVENOUS

## 2013-02-25 MED ORDER — TECHNETIUM TC 99M SESTAMIBI GENERIC - CARDIOLITE
11.0000 | Freq: Once | INTRAVENOUS | Status: AC | PRN
  Administered 2013-02-25: 11 via INTRAVENOUS

## 2013-02-25 NOTE — Telephone Encounter (Signed)
Joseph Shannon was enrolled in [employer] [facility] [employer].  Spoke and faxed Application to [name] with [employer]

## 2013-02-25 NOTE — Progress Notes (Signed)
[facility] SITE 3 NUCLEAR MED [address] [place], [place] [address] [phone]    Cardiology Nuclear Med Study  Joseph Shannon is a 32 y.o. male     MRN : [id number]     DOB: February 08, 1981  Procedure Date: 02/25/2013  Nuclear Med Background Indication for Stress Test:  Evaluation for Ischemia History:  Echo 08/2012 EF 50-55%, Asthma Cardiac Risk Factors: Family History - CAD, Hypertension, Smoker and TIA  Symptoms:  Chest Pain (last date of chest discomfort was last week)   Nuclear Pre-Procedure Caffeine/Decaff Intake:  None NPO After: 9:00pm   Lungs:  clear O2 Sat: 94% on room air. IV 0.9% NS with Angio Cath:  22g  IV Site: L Antecubital  IV Started by:  Bonnita Levan, RN  Chest Size (in):  48 Cup Size: n/a  Height: 6\' 2"  (1.88 m)  Weight:  243 lb (110.224 kg)  BMI:  Body mass index is 31.19 kg/(m^2). Tech Comments:  N/A    Nuclear Med Study 1 or 2 day study: 1 day  Stress Test Type:  Stress  Reading MD: Cassell Clement, MD  Order Authorizing Provider:  Tobias Alexander, MD  Resting Radionuclide: Technetium 36m Sestamibi  Resting Radionuclide Dose: 11.0 mCi   Stress Radionuclide:  Technetium 26m Sestamibi  Stress Radionuclide Dose: 33.0 mCi           Stress Protocol Rest HR: 86 Stress HR: 171  Rest BP: 146/72 Stress BP: 173/106  Exercise Time (min): 10:06 METS: 11.8   Predicted Max HR: 188 bpm % Max HR: 90.96 bpm Rate Pressure Product: 78469   Dose of Adenosine (mg):  n/a Dose of Lexiscan: n/a mg  Dose of Atropine (mg): n/a Dose of Dobutamine: n/a mcg/kg/min (at max HR)  Stress Test Technologist: Nelson Chimes, BS-ES  Nuclear Technologist:  Dario Guardian, CNMT     Rest Procedure:  Myocardial perfusion imaging was performed at rest 45 minutes following the intravenous administration of Technetium 66m Sestamibi. Rest ECG: NSR - Normal EKG  Stress Procedure:  The patient exercised on the treadmill utilizing the Bruce Protocol for 10:06 minutes. The  patient stopped due to SOB, fatigue and denied any chest pain.  Technetium 44m Sestamibi was injected at peak exercise and myocardial perfusion imaging was performed after a brief delay. Patient had to use his inhaler post exercise. Recovery uneventful.  Stress ECG: No significant change from baseline ECG  QPS Raw Data Images:  Normal; no motion artifact; normal heart/lung ratio. Stress Images:  Normal homogeneous uptake in all areas of the myocardium. Rest Images:  Normal homogeneous uptake in all areas of the myocardium. Subtraction (SDS):  No evidence of ischemia. Transient Ischemic Dilatation (Normal <1.22):  0.85 Lung/Heart Ratio (Normal <0.45):  0.33  Quantitative Gated Spect Images QGS EDV:  165 ml QGS ESV:  90 ml  Impression Exercise Capacity:  Good exercise capacity. BP Response:  Hypertensive blood pressure response. Clinical Symptoms:  No chest pain. ECG Impression:  No significant ST segment change suggestive of ischemia. Comparison with Prior Nuclear Study: No images to compare  Overall Impression:  Low risk stress nuclear study.  No evidence of ischemia. Good exercise tolerance. LV systolic function is mildly depressed.  LV Ejection Fraction: 45%.  LV Wall Motion:  Normal Wall Motion. No segmental wall motion abnormalities. EF mildly depressed.   Cassell Clement

## 2013-03-12 ENCOUNTER — Ambulatory Visit: Payer: No Typology Code available for payment source | Admitting: Cardiology

## 2013-03-23 ENCOUNTER — Ambulatory Visit (INDEPENDENT_AMBULATORY_CARE_PROVIDER_SITE_OTHER): Payer: No Typology Code available for payment source | Admitting: Cardiology

## 2013-03-23 VITALS — BP 158/80 | HR 82 | Ht 75.0 in | Wt 245.0 lb

## 2013-03-23 DIAGNOSIS — R002 Palpitations: Secondary | ICD-10-CM

## 2013-03-23 DIAGNOSIS — G459 Transient cerebral ischemic attack, unspecified: Secondary | ICD-10-CM

## 2013-03-23 MED ORDER — AMLODIPINE BESYLATE 10 MG PO TABS: 10.0000 mg | ORAL_TABLET | Freq: Every day | ORAL | Status: DC

## 2013-03-23 MED ORDER — METOPROLOL TARTRATE 50 MG PO TABS: 75.0000 mg | ORAL_TABLET | Freq: Two times a day (BID) | ORAL | Status: DC

## 2013-03-23 MED ORDER — METOPROLOL TARTRATE 50 MG PO TABS: 50.0000 mg | ORAL_TABLET | Freq: Two times a day (BID) | ORAL | Status: DC

## 2013-03-23 MED ORDER — AMLODIPINE BESYLATE 5 MG PO TABS: 5.0000 mg | ORAL_TABLET | Freq: Every day | ORAL | Status: DC

## 2013-03-23 NOTE — Patient Instructions (Addendum)
Your physician has recommended that you wear a 24 hour holter monitor. Holter monitors are medical devices that record the heart's electrical activity. Doctors most often use these monitors to diagnose arrhythmias. Arrhythmias are problems with the speed or rhythm of the heartbeat. The monitor is a small, portable device. You can wear one while you do your normal daily activities. This is usually used to diagnose what is causing palpitations/syncope (passing out).  Your physician has recommended you make the following change in your medication: increase Amlodipine to 10 mg daily and Metoprolol to 75 mg twice daily  Your physician recommends that you schedule a follow-up appointment in: 1 month

## 2013-03-23 NOTE — Addendum Note (Signed)
**Note De-Identified Laelani Vasko Obfuscation** Addended by: Demetrios Loll on: 03/23/2013 02:55 PM   Modules accepted: Orders

## 2013-03-23 NOTE — Addendum Note (Signed)
Addended by: Demetrios Loll on: 03/23/2013 02:50 PM   Modules accepted: Orders

## 2013-03-23 NOTE — Progress Notes (Signed)
Patient ID: Joseph Shannon, male   DOB: 1980/10/29, 32 y.o.   MRN: [id number]     Patient Name: Tayvien Kane Date of Encounter: 03/23/2013  Primary Care Provider:  No PCP Per Patient Primary Cardiologist:  Tobias Alexander, H  Patient Profile  Post-hospitalization visit  Problem List   Past Medical History  Diagnosis Date  . Hypertension   . Asthma    No past surgical history on file.  Allergies  No Known Allergies  HPI  32 year old gentleman with history of hypertension and asthma who was seen in the emergency department for complaints of cough and chest pain and left sided weakness.   In May 2014 the patient presented with intermittent h/a for 4 days with elevated BP, but progressed to central chest tight and episode of coughing, BP elevated at 172/113. Cardiopulm exam benign. Neuro exam benign. EKG unchanged from prior. Pt admitted 5/'13 for TIA/HTN, found to have possible apical hypokinesis, and benign appearing pineal cyst. He was started on lopressor & ACE inhibitor, asked to f/u with PCP. He has not yet been able to establish with one and has not been able to have stress testing as directed. CXR unremarkable, trop neg x2. Pt's h/a, CP, and BP improved after 1 SL NTG.   The patient is coming here today and he states that he has just been almost daily, retrosternal pressure-like nonexertional but sometimes they feel like they're relieved by rest, he hasn't tried to use nitroglycerin with it.   The patient is very concerned as his mom with an early 25s just had a major myocardial infarction. His cousin just had a left ventricular assist device placed heart failure, the patient is not aware of etiology.  Home Medications  Prior to Admission medications   Medication Sig Start Date End Date Taking? Authorizing Provider  albuterol (PROVENTIL HFA;VENTOLIN HFA) 108 (90 BASE) MCG/ACT inhaler Inhale 2 puffs into the lungs daily.    Yes Historical Provider, MD  albuterol  (PROVENTIL) (2.5 MG/3ML) 0.083% nebulizer solution Take 2.5 mg by nebulization every 6 (six) hours as needed for wheezing.   Yes Historical Provider, MD  fluticasone (FLOVENT HFA) 110 MCG/ACT inhaler Inhale 1 puff into the lungs 2 (two) times daily as needed (for wheezing).    Yes Historical Provider, MD  lisinopril-hydrochlorothiazide (PRINZIDE,ZESTORETIC) 20-25 MG per tablet Take 2 tablets by mouth daily. 02/04/13  Yes Alison Murray, MD  metoprolol tartrate (LOPRESSOR) 25 MG tablet Take 1 tablet (25 mg total) by mouth 2 (two) times daily. 02/04/13  Yes Alison Murray, MD    Family History  Family History  Problem Relation Age of Onset  . Hypertension Father   . Hypertension Mother     Social History  History   Social History  . Marital Status: Single    Spouse Name: N/A    Number of Children: N/A  . Years of Education: N/A   Occupational History  . Not on file.   Social History Main Topics  . Smoking status: Heavy Tobacco Smoker -- 0.50 packs/day    Types: Cigarettes  . Smokeless tobacco: Not on file  . Alcohol Use: No     Comment: Socially Only  . Drug Use: No  . Sexual Activity: Not on file   Other Topics Concern  . Not on file   Social History Narrative  . No narrative on file     Review of Systems, as per history of present illness General:  No chills, fever, night sweats or  weight changes.  Cardiovascular:  No chest pain, dyspnea on exertion, edema, orthopnea, palpitations, paroxysmal nocturnal dyspnea. Dermatological: No rash, lesions/masses Respiratory: No cough, dyspnea Urologic: No hematuria, dysuria Abdominal:   No nausea, vomiting, diarrhea, bright red blood per rectum, melena, or hematemesis Neurologic:  No visual changes, wkns, changes in mental status. All other systems reviewed and are otherwise negative except as noted above.  Physical Exam  Blood pressure 158/80, pulse 82, height 6\' 3"  (1.905 m), weight 245 lb (111.131 kg).  General: Pleasant,  NAD Psych: Normal affect. Neuro: Alert and oriented X 3. Moves all extremities spontaneously. HEENT: Normal  Neck: Supple without bruits or JVD. Lungs:  Resp regular and unlabored, CTA. Heart: RRR no s3, s4, or murmurs. Abdomen: Soft, non-tender, non-distended, BS + x 4.  Extremities: No clubbing, cyanosis or edema. DP/PT/Radials 2+ and equal bilaterally.  Accessory Clinical Findings  ECG - sinus rhythm 88 beats per minute, normal EKG  Lipid Panel     Component Value Date/Time   CHOL 200 09/19/2012 0600   TRIG 178* 09/19/2012 0600   HDL 42 09/19/2012 0600   CHOLHDL 4.8 09/19/2012 0600   VLDL 36 09/19/2012 0600   LDLCALC 122* 09/19/2012 0600   Duplex carotids; 09/19/12 Summary:  - No significant extracranial carotid artery stenosis demonstrated. Vertebrals are patent with antegrade flow. -  TTE 09/19/12 Study Conclusions  - Left ventricle: The cavity size was normal. Wall thickness was increased in a pattern of mild LVH. Systolic function was normal. The estimated ejection fraction was in the range of 50% to 55%. Possible hypokinesis of the apical myocardium. Left ventricular diastolic function parameters were normal. - Left atrium: The atrium was mildly dilated. Impressions:  - Possible small area of apical hypokinesis; suggest fu study with contrast to further evaluate if clinically indicated.   Exercise nuclear stress test: 02/26/2013 Quantitative Gated Spect Images  QGS EDV: 165 ml  QGS ESV: 90 ml  Impression  Exercise Capacity: Good exercise capacity.  BP Response: Hypertensive blood pressure response.  Clinical Symptoms: No chest pain.  ECG Impression: No significant ST segment change suggestive of ischemia.  Comparison with Prior Nuclear Study: No images to compare  Overall Impression: Low risk stress nuclear study. No evidence of ischemia. Good exercise tolerance. LV systolic function is mildly depressed.  LV Ejection Fraction: 45%. LV Wall Motion: Normal  Wall Motion. No segmental wall motion abnormalities. EF mildly depressed.  Cassell Clement    Assessment & Plan  32 year old gentleman with a history of hypertension, possible TIA, pineal cyst, with significant family history of CAD with coming for concerns of chest pain.  1. Chest pain - The patient has a possible hypokinetic apex on transthoracic echocardiogram. Exercise nuclear stress test showed mildly decreased LV EF 45% with no perfusion defect. He had good exercise capacity but hypertensive response to exercise.  2. Hypertension - uncontrolled, we will further increase metoprolol to 75 by mouth twice a day, continue lisinopril 20 mg daily 25 mg of hydrochlorothiazide, and increase amlodipine to 10 mg daily  3. Palpitations, history of TIA, mildly dilated left atrium - the patient couldn't efford eCardio monitor, he was enrolled for [employer]. If we can't get eCardio monitor we will proceed with 24 H Holter monitor to evaluate for possible atrial fibrillation as a cause for his TIA.  Followup in 1 month.   Tobias Alexander, Rexene Edison, MD 03/23/2013, 2:15 PM

## 2013-03-24 ENCOUNTER — Encounter (INDEPENDENT_AMBULATORY_CARE_PROVIDER_SITE_OTHER): Payer: No Typology Code available for payment source

## 2013-03-24 ENCOUNTER — Encounter: Payer: Self-pay | Admitting: *Deleted

## 2013-03-24 DIAGNOSIS — G459 Transient cerebral ischemic attack, unspecified: Secondary | ICD-10-CM

## 2013-03-24 DIAGNOSIS — R002 Palpitations: Secondary | ICD-10-CM

## 2013-03-24 NOTE — Progress Notes (Signed)
Patient ID: Joseph Shannon, male   DOB: December 12, 1980, 32 y.o.   MRN: [id number] EVO 48 hour holter monitor applied to patient.

## 2013-04-02 ENCOUNTER — Telehealth: Payer: Self-pay | Admitting: *Deleted

## 2013-04-02 NOTE — Telephone Encounter (Signed)
Informed patient Holter monitor results normal. Patient verbalized understanding.

## 2013-04-28 ENCOUNTER — Encounter: Payer: Self-pay | Admitting: Cardiology

## 2013-04-28 ENCOUNTER — Ambulatory Visit (INDEPENDENT_AMBULATORY_CARE_PROVIDER_SITE_OTHER): Payer: No Typology Code available for payment source | Admitting: Cardiology

## 2013-04-28 VITALS — BP 150/110 | HR 64 | Ht 75.0 in | Wt 244.0 lb

## 2013-04-28 DIAGNOSIS — I10 Essential (primary) hypertension: Secondary | ICD-10-CM

## 2013-04-28 MED ORDER — HYDROCHLOROTHIAZIDE 25 MG PO TABS: 25.0000 mg | ORAL_TABLET | Freq: Every day | ORAL | Status: DC

## 2013-04-28 MED ORDER — LISINOPRIL 40 MG PO TABS: 40.0000 mg | ORAL_TABLET | Freq: Every day | ORAL | Status: DC

## 2013-04-28 NOTE — Patient Instructions (Signed)
Stop Lisinopril HCT   Start HCTZ 25 mg daily  Start Lisinopril 40 mg daily   Schedule Renal Artery Duplex   Your physician recommends that you schedule a follow-up appointment in: 1 month

## 2013-04-28 NOTE — Addendum Note (Signed)
Addended by: Meda Klinefelter D on: 04/28/2013 10:43 AM   Modules accepted: Orders, Medications

## 2013-04-28 NOTE — Progress Notes (Signed)
Patient ID: Joseph Shannon, male   DOB: Sep 17, 1980, 32 y.o.   MRN: [id number] Patient ID: Joseph Shannon, male   DOB: 1980/09/11, 32 y.o.   MRN: [id number]     Patient Name: Joseph Shannon Date of Encounter: 04/28/2013  Primary Care Provider:  No PCP Per Patient Primary Cardiologist:  Tobias Alexander, H  Patient Profile  Post-hospitalization visit  Problem List   Past Medical History  Diagnosis Date  . Hypertension   . Asthma    No past surgical history on file.  Allergies  No Known Allergies  HPI  32 year old gentleman with history of hypertension and asthma who was seen in the emergency department for complaints of cough and chest pain and left sided weakness.   In May 2014 the patient presented with intermittent h/a for 4 days with elevated BP, but progressed to central chest tight and episode of coughing, BP elevated at 172/113. Cardiopulm exam benign. Neuro exam benign. EKG unchanged from prior. Pt admitted 5/'13 for TIA/HTN, found to have possible apical hypokinesis, and benign appearing pineal cyst. He was started on lopressor & ACE inhibitor, asked to f/u with PCP. He has not yet been able to establish with one and has not been able to have stress testing as directed. CXR unremarkable, trop neg x2. Pt's h/a, CP, and BP improved after 1 SL NTG.   The patient is coming here today and he states that he has just been almost daily, retrosternal pressure-like nonexertional but sometimes they feel like they're relieved by rest, he hasn't tried to use nitroglycerin with it.   The patient is very concerned as his mom with an early 67s just had a major myocardial infarction. His cousin just had a left ventricular assist device placed heart failure, the patient is not aware of etiology.  Home Medications  Prior to Admission medications   Medication Sig Start Date End Date Taking? Authorizing Provider  albuterol (PROVENTIL HFA;VENTOLIN HFA) 108 (90 BASE) MCG/ACT inhaler  Inhale 2 puffs into the lungs daily.    Yes Historical Provider, MD  albuterol (PROVENTIL) (2.5 MG/3ML) 0.083% nebulizer solution Take 2.5 mg by nebulization every 6 (six) hours as needed for wheezing.   Yes Historical Provider, MD  fluticasone (FLOVENT HFA) 110 MCG/ACT inhaler Inhale 1 puff into the lungs 2 (two) times daily as needed (for wheezing).    Yes Historical Provider, MD  lisinopril-hydrochlorothiazide (PRINZIDE,ZESTORETIC) 20-25 MG per tablet Take 2 tablets by mouth daily. 02/04/13  Yes Alison Murray, MD  metoprolol tartrate (LOPRESSOR) 25 MG tablet Take 1 tablet (25 mg total) by mouth 2 (two) times daily. 02/04/13  Yes Alison Murray, MD    Family History  Family History  Problem Relation Age of Onset  . Hypertension Father   . Hypertension Mother     Social History  History   Social History  . Marital Status: Single    Spouse Name: N/A    Number of Children: N/A  . Years of Education: N/A   Occupational History  . Not on file.   Social History Main Topics  . Smoking status: Former Smoker -- 0.50 packs/day    Types: Cigarettes  . Smokeless tobacco: Not on file  . Alcohol Use: No     Comment: Socially Only  . Drug Use: No  . Sexual Activity: Not on file   Other Topics Concern  . Not on file   Social History Narrative  . No narrative on file     Review of Systems,  as per history of present illness General:  No chills, fever, night sweats or weight changes.  Cardiovascular:  No chest pain, dyspnea on exertion, edema, orthopnea, palpitations, paroxysmal nocturnal dyspnea. Dermatological: No rash, lesions/masses Respiratory: No cough, dyspnea Urologic: No hematuria, dysuria Abdominal:   No nausea, vomiting, diarrhea, bright red blood per rectum, melena, or hematemesis Neurologic:  No visual changes, wkns, changes in mental status. All other systems reviewed and are otherwise negative except as noted above.  Physical Exam  Blood pressure 150/110, pulse 64,  height 6\' 3"  (1.905 m), weight 244 lb (110.678 kg).  General: Pleasant, NAD Psych: Normal affect. Neuro: Alert and oriented X 3. Moves all extremities spontaneously. HEENT: Normal  Neck: Supple without bruits or JVD. Lungs:  Resp regular and unlabored, CTA. Heart: RRR no s3, s4, or murmurs. Abdomen: Soft, non-tender, non-distended, BS + x 4.  Extremities: No clubbing, cyanosis, mild LE edema. DP/PT/Radials 2+ and equal bilaterally.  Accessory Clinical Findings  ECG - sinus rhythm 88 beats per minute, normal EKG  Lipid Panel     Component Value Date/Time   CHOL 200 09/19/2012 0600   TRIG 178* 09/19/2012 0600   HDL 42 09/19/2012 0600   CHOLHDL 4.8 09/19/2012 0600   VLDL 36 09/19/2012 0600   LDLCALC 122* 09/19/2012 0600   Duplex carotids; 09/19/12 Summary:  - No significant extracranial carotid artery stenosis demonstrated. Vertebrals are patent with antegrade flow. -  TTE 09/19/12 Study Conclusions  - Left ventricle: The cavity size was normal. Wall thickness was increased in a pattern of mild LVH. Systolic function was normal. The estimated ejection fraction was in the range of 50% to 55%. Possible hypokinesis of the apical myocardium. Left ventricular diastolic function parameters were normal. - Left atrium: The atrium was mildly dilated. Impressions:  - Possible small area of apical hypokinesis; suggest fu study with contrast to further evaluate if clinically indicated.   Exercise nuclear stress test: 02/26/2013 Quantitative Gated Spect Images  QGS EDV: 165 ml  QGS ESV: 90 ml  Impression  Exercise Capacity: Good exercise capacity.  BP Response: Hypertensive blood pressure response.  Clinical Symptoms: No chest pain.  ECG Impression: No significant ST segment change suggestive of ischemia.  Comparison with Prior Nuclear Study: No images to compare  Overall Impression: Low risk stress nuclear study. No evidence of ischemia. Good exercise tolerance. LV systolic  function is mildly depressed.  LV Ejection Fraction: 45%. LV Wall Motion: Normal Wall Motion. No segmental wall motion abnormalities. EF mildly depressed.  Joseph Shannon    Assessment & Plan  32 year old gentleman with a history of hypertension, possible TIA, pineal cyst, with significant family history of CAD with coming for concerns of chest pain.  1. Chest pain - The patient has a possible hypokinetic apex on transthoracic echocardiogram. Exercise nuclear stress test showed mildly decreased LV EF 45% with no perfusion defect. He had good exercise capacity but hypertensive response to exercise.  2. Hypertension - still uncontrolled, continue metoprolol to 75 by mouth twice a day, continue 25 mg of hydrochlorothiazide, amlodipine to 10 mg daily and increase lisinopril to 40 mg po daily. We will order a renal artery ultrasound to evaluate for stenosis as a possible cause of HTN. The patient is asked to bring a diary of BP over 1 months during the different times of the day.  3. Palpitations, history of TIA, mildly dilated left atrium - the patient couldn't efford eCardio monitor, he was enrolled for [employer]. 24H Holter was completely  normal, no a-fib.   Followup in 1 month with BP diary.   Tobias Alexander, Rexene Edison, MD 04/28/2013, 10:22 AM

## 2013-05-06 ENCOUNTER — Encounter: Payer: Self-pay | Admitting: Cardiovascular Disease

## 2013-05-06 ENCOUNTER — Ambulatory Visit (HOSPITAL_COMMUNITY): Payer: No Typology Code available for payment source | Attending: Cardiology

## 2013-05-06 DIAGNOSIS — I10 Essential (primary) hypertension: Secondary | ICD-10-CM | POA: Insufficient documentation

## 2013-05-06 DIAGNOSIS — Z8673 Personal history of transient ischemic attack (TIA), and cerebral infarction without residual deficits: Secondary | ICD-10-CM | POA: Insufficient documentation

## 2013-05-06 DIAGNOSIS — Z87891 Personal history of nicotine dependence: Secondary | ICD-10-CM | POA: Insufficient documentation

## 2013-05-07 NOTE — Progress Notes (Signed)
Quick Note:  Results normal. No need to call, per Dr. Delton See. ______

## 2013-05-18 ENCOUNTER — Ambulatory Visit (INDEPENDENT_AMBULATORY_CARE_PROVIDER_SITE_OTHER): Payer: No Typology Code available for payment source | Admitting: Cardiology

## 2013-05-18 ENCOUNTER — Telehealth: Payer: Self-pay | Admitting: Cardiology

## 2013-05-18 ENCOUNTER — Encounter: Payer: Self-pay | Admitting: Cardiology

## 2013-05-18 VITALS — BP 145/90 | HR 76 | Ht 75.0 in | Wt 247.4 lb

## 2013-05-18 DIAGNOSIS — I160 Hypertensive urgency: Secondary | ICD-10-CM

## 2013-05-18 DIAGNOSIS — I10 Essential (primary) hypertension: Secondary | ICD-10-CM

## 2013-05-18 MED ORDER — ISOSORBIDE MONONITRATE ER 30 MG PO TB24: 30.0000 mg | ORAL_TABLET | Freq: Every day | ORAL | Status: DC

## 2013-05-18 MED ORDER — ISOSORBIDE DINITRATE 30 MG PO TABS: ORAL_TABLET | ORAL | Status: DC

## 2013-05-18 MED ORDER — HYDRALAZINE HCL 25 MG PO TABS: 25.0000 mg | ORAL_TABLET | Freq: Three times a day (TID) | ORAL | Status: DC

## 2013-05-18 NOTE — Addendum Note (Signed)
Addended by: Meda Klinefelter D on: 05/18/2013 10:03 AM   Modules accepted: Orders

## 2013-05-18 NOTE — Telephone Encounter (Signed)
For patient to get this at a $4 cost the pharmacy needs to use Isosorbide mononitrate. Will forward to Dawayne Patricia NP to see if change appropriate.

## 2013-05-18 NOTE — Patient Instructions (Addendum)
Start Isosorbide Dinitrate 30 mg daily   Start Hydralazine 25 mg three times a day    Your physician wants you to follow-up in: 3 months You will receive a reminder letter in the mail two months in advance. If you don't receive a letter, please call our office to schedule the follow-up appointment.

## 2013-05-18 NOTE — Telephone Encounter (Signed)
Called pharmacy and cancelled Isordil and sent over Imdur.

## 2013-05-18 NOTE — Telephone Encounter (Signed)
I think this was meant to be Imdur - only dosed QD.

## 2013-05-18 NOTE — Progress Notes (Signed)
Patient ID: Vernard Rymer, male   DOB: August 23, 1980, 33 y.o.   MRN: [id number]     Patient Name: Alexaner Fasig Date of Encounter: 05/18/2013  Primary Care Provider:  No PCP Per Patient Primary Cardiologist:  Tobias Alexander, H  Patient Profile  Post-hospitalization visit  Problem List   Past Medical History  Diagnosis Date  . Hypertension   . Asthma    No past surgical history on file.  Allergies  No Known Allergies  HPI  33 year old gentleman with history of hypertension and asthma who was seen in the emergency department for complaints of cough and chest pain and left sided weakness.   In May 2014 the patient presented with intermittent h/a for 4 days with elevated BP, but progressed to central chest tight and episode of coughing, BP elevated at 172/113. Cardiopulm exam benign. Neuro exam benign. EKG unchanged from prior. Pt admitted 5/'13 for TIA/HTN, found to have possible apical hypokinesis, and benign appearing pineal cyst. He was started on lopressor & ACE inhibitor, asked to f/u with PCP. He has not yet been able to establish with one and has not been able to have stress testing as directed. CXR unremarkable, trop neg x2. Pt's h/a, CP, and BP improved after 1 SL NTG.   The patient is coming here today and he states that he has just been almost daily, retrosternal pressure-like nonexertional but sometimes they feel like they're relieved by rest, he hasn't tried to use nitroglycerin with it.   The patient is very concerned as his mom with an early 45s just had a major myocardial infarction. His cousin just had a left ventricular assist device placed heart failure, the patient is not aware of etiology.  The patient is asymptomatic. He brings a BP diary, his BP is consistently elevated with values in 150-190 mmHg range.   Home Medications  Prior to Admission medications   Medication Sig Start Date End Date Taking? Authorizing Provider  albuterol (PROVENTIL  HFA;VENTOLIN HFA) 108 (90 BASE) MCG/ACT inhaler Inhale 2 puffs into the lungs daily.    Yes Historical Provider, MD  albuterol (PROVENTIL) (2.5 MG/3ML) 0.083% nebulizer solution Take 2.5 mg by nebulization every 6 (six) hours as needed for wheezing.   Yes Historical Provider, MD  fluticasone (FLOVENT HFA) 110 MCG/ACT inhaler Inhale 1 puff into the lungs 2 (two) times daily as needed (for wheezing).    Yes Historical Provider, MD  lisinopril-hydrochlorothiazide (PRINZIDE,ZESTORETIC) 20-25 MG per tablet Take 2 tablets by mouth daily. 02/04/13  Yes Alison Murray, MD  metoprolol tartrate (LOPRESSOR) 25 MG tablet Take 1 tablet (25 mg total) by mouth 2 (two) times daily. 02/04/13  Yes Alison Murray, MD    Family History  Family History  Problem Relation Age of Onset  . Hypertension Father   . Hypertension Mother     Social History  History   Social History  . Marital Status: Single    Spouse Name: N/A    Number of Children: N/A  . Years of Education: N/A   Occupational History  . Not on file.   Social History Main Topics  . Smoking status: Former Smoker -- 0.50 packs/day    Types: Cigarettes  . Smokeless tobacco: Not on file  . Alcohol Use: No     Comment: Socially Only  . Drug Use: No  . Sexual Activity: Not on file   Other Topics Concern  . Not on file   Social History Narrative  . No narrative on file  Review of Systems, as per history of present illness General:  No chills, fever, night sweats or weight changes.  Cardiovascular:  No chest pain, dyspnea on exertion, edema, orthopnea, palpitations, paroxysmal nocturnal dyspnea. Dermatological: No rash, lesions/masses Respiratory: No cough, dyspnea Urologic: No hematuria, dysuria Abdominal:   No nausea, vomiting, diarrhea, bright red blood per rectum, melena, or hematemesis Neurologic:  No visual changes, wkns, changes in mental status. All other systems reviewed and are otherwise negative except as noted  above.  Physical Exam  BP 145/90, HR 76 BPM General: Pleasant, NAD Psych: Normal affect. Neuro: Alert and oriented X 3. Moves all extremities spontaneously. HEENT: Normal  Neck: Supple without bruits or JVD. Lungs:  Resp regular and unlabored, CTA. Heart: RRR no s3, s4, or murmurs. Abdomen: Soft, non-tender, non-distended, BS + x 4.  Extremities: No clubbing, cyanosis, mild LE edema. DP/PT/Radials 2+ and equal bilaterally.  Accessory Clinical Findings  ECG - sinus rhythm 88 beats per minute, normal EKG  Lipid Panel     Component Value Date/Time   CHOL 200 09/19/2012 0600   TRIG 178* 09/19/2012 0600   HDL 42 09/19/2012 0600   CHOLHDL 4.8 09/19/2012 0600   VLDL 36 09/19/2012 0600   LDLCALC 122* 09/19/2012 0600   Duplex carotids; 09/19/12 Summary:  - No significant extracranial carotid artery stenosis demonstrated. Vertebrals are patent with antegrade flow. -  TTE 09/19/12 Study Conclusions  - Left ventricle: The cavity size was normal. Wall thickness was increased in a pattern of mild LVH. Systolic function was normal. The estimated ejection fraction was in the range of 50% to 55%. Possible hypokinesis of the apical myocardium. Left ventricular diastolic function parameters were normal. - Left atrium: The atrium was mildly dilated. Impressions:  - Possible small area of apical hypokinesis; suggest fu study with contrast to further evaluate if clinically indicated.   Exercise nuclear stress test: 02/26/2013 Quantitative Gated Spect Images  QGS EDV: 165 ml  QGS ESV: 90 ml  Impression  Exercise Capacity: Good exercise capacity.  BP Response: Hypertensive blood pressure response.  Clinical Symptoms: No chest pain.  ECG Impression: No significant ST segment change suggestive of ischemia.  Comparison with Prior Nuclear Study: No images to compare  Overall Impression: Low risk stress nuclear study. No evidence of ischemia. Good exercise tolerance. LV systolic function is  mildly depressed.  LV Ejection Fraction: 45%. LV Wall Motion: Normal Wall Motion. No segmental wall motion abnormalities. EF mildly depressed.  Cassell Clementhomas Brackbill   Assessment & Plan  33 year old gentleman with a history of hypertension, possible TIA, pineal cyst, with significant family history of CAD with coming for concerns of chest pain.  1. Chest pain - The patient has a possible hypokinetic apex on transthoracic echocardiogram. Exercise nuclear stress test showed mildly decreased LV EF 45% with no perfusion defect. He had good exercise capacity but hypertensive response to exercise.  2. Hypertension - still uncontrolled, continue current regimen and add BiDil in 2 divided pills to avoid high cost.   3. Palpitations, history of TIA, mildly dilated left atrium - the patient couldn't efford eCardio monitor, he was enrolled for [employer] for [employer]. 24H Holter was completely normal, no a-fib.   Followup in 3 month, meanwhile he will email me with BP diary.   Tobias AlexanderNELSON, Zoriah Pulice, Rexene EdisonH, MD 05/18/2013, 9:28 AM

## 2013-05-18 NOTE — Telephone Encounter (Signed)
New Prob    Pt states she received a prescription for isosorbide dinitrate. She states she only has isosorbide mononitrate available. Calling to see if this is OK to fill. Please call.

## 2013-05-19 NOTE — Telephone Encounter (Signed)
Thank you for calling the pharmacy. Yes it is correct. It was meant to be Imdur 30 mg po daily. Joseph Shannon

## 2013-06-04 ENCOUNTER — Telehealth: Payer: Self-pay | Admitting: *Deleted

## 2013-06-04 NOTE — Telephone Encounter (Signed)
[facility] stating that the patient has been taking his imdur three times daily. He says that an Charity fundraiser from our office called him on the same day of his office visit and told him to take it this way. I don't see any documentation of this. The pharmacist stated that he could be confused and that he is not having any symptoms from taking it this way. I made her aware that per our records patient should only take daily. Just as an FYI to Dr. Delton See.

## 2013-06-10 ENCOUNTER — Ambulatory Visit: Payer: No Typology Code available for payment source | Attending: Internal Medicine

## 2013-08-19 ENCOUNTER — Ambulatory Visit (INDEPENDENT_AMBULATORY_CARE_PROVIDER_SITE_OTHER): Payer: No Typology Code available for payment source | Admitting: Cardiology

## 2013-08-19 ENCOUNTER — Encounter: Payer: Self-pay | Admitting: Cardiology

## 2013-08-19 ENCOUNTER — Ambulatory Visit: Payer: No Typology Code available for payment source

## 2013-08-19 VITALS — BP 142/90 | HR 89 | Ht 74.5 in | Wt 251.8 lb

## 2013-08-19 DIAGNOSIS — R079 Chest pain, unspecified: Secondary | ICD-10-CM | POA: Insufficient documentation

## 2013-08-19 DIAGNOSIS — R0609 Other forms of dyspnea: Secondary | ICD-10-CM | POA: Insufficient documentation

## 2013-08-19 DIAGNOSIS — I10 Essential (primary) hypertension: Secondary | ICD-10-CM

## 2013-08-19 DIAGNOSIS — R070 Pain in throat: Secondary | ICD-10-CM

## 2013-08-19 DIAGNOSIS — M2550 Pain in unspecified joint: Secondary | ICD-10-CM

## 2013-08-19 DIAGNOSIS — R0600 Dyspnea, unspecified: Secondary | ICD-10-CM | POA: Insufficient documentation

## 2013-08-19 DIAGNOSIS — R202 Paresthesia of skin: Secondary | ICD-10-CM | POA: Insufficient documentation

## 2013-08-19 LAB — BASIC METABOLIC PANEL
BUN: 18 mg/dL (ref 6–23)
CO2: 27 mEq/L (ref 19–32)
Calcium: 9.2 mg/dL (ref 8.4–10.5)
Chloride: 102 mEq/L (ref 96–112)
Creatinine, Ser: 1.1 mg/dL (ref 0.4–1.5)
GFR: 103.56 mL/min (ref >60.00)
Glucose, Bld: 139 mg/dL — ABNORMAL HIGH (ref 70–99)
Potassium: 4.1 mEq/L (ref 3.5–5.1)
Sodium: 136 mEq/L (ref 135–145)

## 2013-08-19 LAB — RHEUMATOID FACTOR: Rhuematoid fact SerPl-aCnc: <10 IU/mL (ref <=14)

## 2013-08-19 LAB — HEMOGLOBIN A1C: Hgb A1c MFr Bld: 7.5 % — ABNORMAL HIGH (ref 4.6–6.5)

## 2013-08-19 MED ORDER — NEBIVOLOL HCL 10 MG PO TABS: 10.0000 mg | ORAL_TABLET | Freq: Every day | ORAL | Status: DC

## 2013-08-19 MED ORDER — HYDRALAZINE HCL 50 MG PO TABS: 50.0000 mg | ORAL_TABLET | Freq: Three times a day (TID) | ORAL | Status: DC

## 2013-08-19 MED ORDER — FUROSEMIDE 40 MG PO TABS
40.0000 mg | ORAL_TABLET | Freq: Every day | ORAL | Status: DC
Start: 2013-08-19 — End: 2014-09-09

## 2013-08-19 NOTE — Progress Notes (Signed)
Patient ID: Joseph Shannon, male   DOB: [birth year], 33 y.o.   MRN: [id number]     Patient Name: Joseph Shannon Date of Encounter: 08/19/2013  Primary Care Provider:  No PCP Per Patient Primary Cardiologist:  Lars Masson  Patient Profile  Post-hospitalization visit  Problem List   Past Medical History  Diagnosis Date  . Hypertension   . Asthma    No past surgical history on file.  Allergies  No Known Allergies  HPI  33 year old gentleman with history of hypertension and asthma who was seen in the emergency department for complaints of cough and chest pain and left sided weakness.   In May 2014 the patient presented with intermittent h/a for 4 days with elevated BP, but progressed to central chest tight and episode of coughing, BP elevated at 172/113. Cardiopulm exam benign. Neuro exam benign. EKG unchanged from prior. Pt admitted 5/13 for TIA/HTN, found to have possible apical hypokinesis, and benign appearing pineal cyst. He was started on lopressor & ACE inhibitor, asked to f/u with PCP. He has not yet been able to establish with one and has not been able to have stress testing as directed. CXR unremarkable, trop neg x2. Pt's h/a, CP, and BP improved after 1 SL NTG.   05/18/2013 The patient is coming here today and he states that he has just been almost daily, retrosternal pressure-like nonexertional but sometimes they feel like they're relieved by rest, he hasn't tried to use nitroglycerin with it.  The patient is very concerned as his mom with an early 33s just had a major myocardial infarction. His cousin just had a left ventricular assist device placed heart failure, the patient is not aware of etiology. The patient is asymptomatic. He brings a BP diary, his BP is consistently elevated with values in 150-190 mmHg range.  Imdur 30 mg po daily and hydralazine 25 mg po TID was prescribed.  08/19/2013 - complains of DOE, LE edema, hands and feet tingling. He is also  complaining about multiple small and large joint pain, he states that different arthritis types run in his family.   Home Medications  Prior to Admission medications   Medication Sig Start Date End Date Taking? Authorizing Provider  albuterol (PROVENTIL HFA;VENTOLIN HFA) 108 (90 BASE) MCG/ACT inhaler Inhale 2 puffs into the lungs daily.    Yes Historical Provider, MD  albuterol (PROVENTIL) (2.5 MG/3ML) 0.083% nebulizer solution Take 2.5 mg by nebulization every 6 (six) hours as needed for wheezing.   Yes Historical Provider, MD  fluticasone (FLOVENT HFA) 110 MCG/ACT inhaler Inhale 1 puff into the lungs 2 (two) times daily as needed (for wheezing).    Yes Historical Provider, MD  lisinopril-hydrochlorothiazide (PRINZIDE,ZESTORETIC) 20-25 MG per tablet Take 2 tablets by mouth daily. 02/04/13  Yes Alison Murray, MD  metoprolol tartrate (LOPRESSOR) 25 MG tablet Take 1 tablet (25 mg total) by mouth 2 (two) times daily. 02/04/13  Yes Alison Murray, MD    Family History  Family History  Problem Relation Age of Onset  . Hypertension Father   . Hypertension Mother     Social History  History   Social History  . Marital Status: Single    Spouse Name: N/A    Number of Children: N/A  . Years of Education: N/A   Occupational History  . Not on file.   Social History Main Topics  . Smoking status: Former Smoker -- 0.50 packs/day    Types: Cigarettes  . Smokeless tobacco: Not on  file  . Alcohol Use: No     Comment: Socially Only  . Drug Use: No  . Sexual Activity: Not on file   Other Topics Concern  . Not on file   Social History Narrative  . No narrative on file     Review of Systems, as per history of present illness General:  No chills, fever, night sweats or weight changes.  Cardiovascular:  No chest pain, dyspnea on exertion, edema, orthopnea, palpitations, paroxysmal nocturnal dyspnea. Dermatological: No rash, lesions/masses Respiratory: No cough, dyspnea Urologic: No  hematuria, dysuria Abdominal:   No nausea, vomiting, diarrhea, bright red blood per rectum, melena, or hematemesis Neurologic:  No visual changes, wkns, changes in mental status. All other systems reviewed and are otherwise negative except as noted above.  Physical Exam  General: Pleasant, NAD Psych: Normal affect. Neuro: Alert and oriented X 3. Moves all extremities spontaneously. HEENT: Normal  Neck: Supple without bruits or JVD. Lungs:  Resp regular and unlabored, CTA. Heart: RRR no s3, s4, or murmurs. Abdomen: Soft, non-tender, non-distended, BS + x 4.  Extremities: No clubbing, cyanosis, mild LE edema. DP/PT/Radials 2+ and equal bilaterally.  Accessory Clinical Findings  ECG - sinus rhythm 88 beats per minute, normal EKG  Lipid Panel     Component Value Date/Time   CHOL 200 09/19/2012 0600   TRIG 178* 09/19/2012 0600   HDL 42 09/19/2012 0600   CHOLHDL 4.8 09/19/2012 0600   VLDL 36 09/19/2012 0600   LDLCALC 122* 09/19/2012 0600   Duplex carotids; 09/19/12 Summary:  - No significant extracranial carotid artery stenosis demonstrated. Vertebrals are patent with antegrade flow. -  TTE 09/19/12 Study Conclusions  - Left ventricle: The cavity size was normal. Wall thickness was increased in a pattern of mild LVH. Systolic function was normal. The estimated ejection fraction was in the range of 50% to 55%. Possible hypokinesis of the apical myocardium. Left ventricular diastolic function parameters were normal. - Left atrium: The atrium was mildly dilated. Impressions:  - Possible small area of apical hypokinesis; suggest fu study with contrast to further evaluate if clinically indicated.   Exercise nuclear stress test: 02/26/2013 Quantitative Gated Spect Images  QGS EDV: 165 ml  QGS ESV: 90 ml  Impression  Exercise Capacity: Good exercise capacity.  BP Response: Hypertensive blood pressure response.  Clinical Symptoms: No chest pain.  ECG Impression: No  significant ST segment change suggestive of ischemia.  Comparison with Prior Nuclear Study: No images to compare  Overall Impression: Low risk stress nuclear study. No evidence of ischemia. Good exercise tolerance. LV systolic function is mildly depressed.  LV Ejection Fraction: 45%. LV Wall Motion: Normal Wall Motion. No segmental wall motion abnormalities. EF mildly depressed.  Cassell Clement   Assessment & Plan  33 year old gentleman with a history of hypertension, possible TIA, pineal cyst, with significant family history of CAD with coming for concerns of chest pain.  1. Chest pain - The patient has a possible hypokinetic apex on transthoracic echocardiogram. Exercise nuclear stress test showed mildly decreased LV EF 45% with no perfusion defect. He had good exercise capacity but hypertensive response to exercise.  2. Hypertension - still uncontrolled, we will stop HCTZ, metoprolol, amlodipine  The new regimen is :  Lasix 40 mg po daily bystolic 10 mg po daily imdur 30 mg po daily Hydralazine 50 mg po TID Lisinopril 40 mg po daily  3. Palpitations, history of TIA, mildly dilated left atrium - the patient couldn't efford eCardio monitor, he  was enrolled for [employer] [facility] [employer]. 24H Holter was completely normal, no a-fib.   4. LE edema - meds changes as above  5. Elevated CBG, paresthesias, weight gain - we will check HbA1c - if abnormal start metformin  6. Polyarthralgia - we will draw ANA and RF today  Followup in 3 weeks, check BMP, HbA1c today.   Lars MassonKatarina H Linley Moskal, MD 08/19/2013, 8:09 AM

## 2013-08-19 NOTE — Patient Instructions (Addendum)
Your physician recommends that you schedule a follow-up appointment in: in 3 weeks  Your physician recommends that you return for lab work in: TODAY (BMET, HEMOGLOBIN A1C, ANA, & RF)  You have been referred to AN ENT DOCTOR   STOP TAKING METOPROLOL  STOP TAKING AMOLODIPINE  STOP TAKING HYDROCHLOROTHIAZIDE   START TAKING LASIX 40MG  DAILY  START TAKING BYSTOLIC 10MG  DAILY  START TAKING HYDRALAZINE 5OMG 3 TIMES DAILY

## 2013-08-20 ENCOUNTER — Encounter: Payer: Self-pay | Admitting: Internal Medicine

## 2013-08-20 ENCOUNTER — Ambulatory Visit: Payer: No Typology Code available for payment source | Attending: Internal Medicine | Admitting: Internal Medicine

## 2013-08-20 ENCOUNTER — Telehealth: Payer: Self-pay | Admitting: *Deleted

## 2013-08-20 VITALS — BP 140/90 | HR 93 | Temp 99.3°F | Resp 17

## 2013-08-20 DIAGNOSIS — R079 Chest pain, unspecified: Secondary | ICD-10-CM | POA: Insufficient documentation

## 2013-08-20 DIAGNOSIS — G459 Transient cerebral ischemic attack, unspecified: Secondary | ICD-10-CM

## 2013-08-20 DIAGNOSIS — E119 Type 2 diabetes mellitus without complications: Secondary | ICD-10-CM | POA: Insufficient documentation

## 2013-08-20 DIAGNOSIS — Z87891 Personal history of nicotine dependence: Secondary | ICD-10-CM | POA: Insufficient documentation

## 2013-08-20 DIAGNOSIS — I10 Essential (primary) hypertension: Secondary | ICD-10-CM | POA: Insufficient documentation

## 2013-08-20 DIAGNOSIS — J45909 Unspecified asthma, uncomplicated: Secondary | ICD-10-CM | POA: Insufficient documentation

## 2013-08-20 DIAGNOSIS — Z09 Encounter for follow-up examination after completed treatment for conditions other than malignant neoplasm: Secondary | ICD-10-CM | POA: Insufficient documentation

## 2013-08-20 DIAGNOSIS — Z8673 Personal history of transient ischemic attack (TIA), and cerebral infarction without residual deficits: Secondary | ICD-10-CM | POA: Insufficient documentation

## 2013-08-20 DIAGNOSIS — E669 Obesity, unspecified: Secondary | ICD-10-CM | POA: Insufficient documentation

## 2013-08-20 LAB — ANA: Anti Nuclear Antibody(ANA): NEGATIVE

## 2013-08-20 MED ORDER — FREESTYLE SYSTEM KIT: 1.0000 | PACK | Status: AC | PRN

## 2013-08-20 MED ORDER — METFORMIN HCL 500 MG PO TABS: 500.0000 mg | ORAL_TABLET | Freq: Two times a day (BID) | ORAL | Status: DC

## 2013-08-20 NOTE — Telephone Encounter (Signed)
lmtcb about Dr. Joanie Coddington recommendations

## 2013-08-20 NOTE — Progress Notes (Signed)
MRN: [id number] Name: Joseph Shannon  Sex: male Age: 33 y.o. DOB: [birth year]  Allergies: Review of patient's allergies indicates no known allergies.  Chief Complaint  Patient presents with  . Follow-up    HPI: Patient is 33 y.o. male who has history of hypertension TIA chest pain, has been following up with his cardiologist who has been managing his blood pressure medications, yesterday when he saw his cardiologist his medications were optimized, patient will stop her amlodipine , hydrochlorothiazide, metoprolol and he was switched to Bystolic, Lasix and is taking lisinopril hydralazine and imdur, patient initially came his blood pressure was elevated, repeat manual blood pressure is 140/90 denies any headache dizziness chest and shortness of breath, he also reported to have some joint pain, yesterday he had a blood work done which was reviewed with the patient any other factors are negative, his hemoglobin A1c was 7.5%, patient is diabetic and reported to have family history of diabetes as well.  Past Medical History  Diagnosis Date  . Hypertension   . Asthma     History reviewed. No pertinent past surgical history.    Medication List       This list is accurate as of: 08/20/13 12:28 PM.  Always use your most recent med list.               albuterol (2.5 MG/3ML) 0.083% nebulizer solution  Commonly known as:  PROVENTIL  Take 2.5 mg by nebulization every 6 (six) hours as needed for wheezing.     albuterol 108 (90 BASE) MCG/ACT inhaler  Commonly known as:  PROVENTIL HFA;VENTOLIN HFA  Inhale 2 puffs into the lungs daily.     amLODipine 10 MG tablet  Commonly known as:  NORVASC  Take 10 mg by mouth daily.     fluticasone 110 MCG/ACT inhaler  Commonly known as:  FLOVENT HFA  Inhale 1 puff into the lungs 2 (two) times daily as needed (for wheezing).     furosemide 40 MG tablet  Commonly known as:  LASIX  Take 1 tablet (40 mg total) by mouth daily.     glucose  monitoring kit monitoring kit  1 each by Does not apply route as needed for other. Dispense any model that is covered- dispense testing supplies for Q AC/ HS accuchecks- 1 month supply with one refil.     hydrALAZINE 50 MG tablet  Commonly known as:  APRESOLINE  Take 1 tablet (50 mg total) by mouth 3 (three) times daily.     isosorbide mononitrate 30 MG 24 hr tablet  Commonly known as:  IMDUR  Take 1 tablet (30 mg total) by mouth daily.     lisinopril 40 MG tablet  Commonly known as:  PRINIVIL,ZESTRIL  Take 1 tablet (40 mg total) by mouth daily.     lisinopril-hydrochlorothiazide 20-25 MG per tablet  Commonly known as:  PRINZIDE,ZESTORETIC  Take 1 tablet by mouth daily.     metFORMIN 500 MG tablet  Commonly known as:  GLUCOPHAGE  Take 1 tablet (500 mg total) by mouth 2 (two) times daily with a meal.     metoprolol 50 MG tablet  Commonly known as:  LOPRESSOR  Take 50 mg by mouth 2 (two) times daily. Take one and one half tab for total of 32m twice a day     nebivolol 10 MG tablet  Commonly known as:  BYSTOLIC  Take 1 tablet (10 mg total) by mouth daily.     nitroGLYCERIN 0.4 MG  SL tablet  Commonly known as:  NITROSTAT  Place 1 tablet (0.4 mg total) under the tongue every 5 (five) minutes as needed for chest pain.        Meds ordered this encounter  Medications  . lisinopril-hydrochlorothiazide (PRINZIDE,ZESTORETIC) 20-25 MG per tablet    Sig: Take 1 tablet by mouth daily.  Marland Kitchen amLODipine (NORVASC) 10 MG tablet    Sig: Take 10 mg by mouth daily.  . metoprolol (LOPRESSOR) 50 MG tablet    Sig: Take 50 mg by mouth 2 (two) times daily. Take one and one half tab for total of 76m twice a day  . metFORMIN (GLUCOPHAGE) 500 MG tablet    Sig: Take 1 tablet (500 mg total) by mouth 2 (two) times daily with a meal.    Dispense:  180 tablet    Refill:  3  . glucose monitoring kit (FREESTYLE) monitoring kit    Sig: 1 each by Does not apply route as needed for other. Dispense any  model that is covered- dispense testing supplies for Q AC/ HS accuchecks- 1 month supply with one refil.    Dispense:  1 each    Refill:  1     There is no immunization history on file for this patient.  Family History  Problem Relation Age of Onset  . Hypertension Father   . Heart disease Father   . Hypertension Mother   . Heart disease Mother   . Heart disease Paternal Uncle   . Heart disease Maternal Grandmother     History  Substance Use Topics  . Smoking status: Former Smoker -- 0.50 packs/day    Types: Cigarettes  . Smokeless tobacco: Not on file  . Alcohol Use: No     Comment: Socially Only    Review of Systems   As noted in HPI  Filed Vitals:   08/20/13 1224  BP: 140/90  Pulse:   Temp:   Resp:     Physical Exam  Physical Exam  Constitutional: No distress.  Eyes: EOM are normal. Pupils are equal, round, and reactive to light.  Cardiovascular: Normal rate and regular rhythm.   Pulmonary/Chest: Breath sounds normal. No respiratory distress. He has no wheezes. He has no rales.  Abdominal: There is no tenderness. There is no rebound.  Musculoskeletal: He exhibits no edema.    CBC    Component Value Date/Time   WBC 5.1 01/26/2013 0955   RBC 4.65 01/26/2013 0955   HGB 15.5 01/26/2013 0955   HCT 42.0 01/26/2013 0955   PLT 211 01/26/2013 0955   MCV 90.3 01/26/2013 0955   LYMPHSABS 3.2 09/15/2012 0621   MONOABS 0.6 09/15/2012 0621   EOSABS 0.3 09/15/2012 0621   BASOSABS 0.0 09/15/2012 0621    CMP     Component Value Date/Time   NA 136 08/19/2013 0904   K 4.1 08/19/2013 0904   CL 102 08/19/2013 0904   CO2 27 08/19/2013 0904   GLUCOSE 139* 08/19/2013 0904   BUN 18 08/19/2013 0904   CREATININE 1.1 08/19/2013 0904   CALCIUM 9.2 08/19/2013 0904   GFRNONAA >90 01/26/2013 0955   GFRAA >90 01/26/2013 0955    Lab Results  Component Value Date/Time   CHOL 200 09/19/2012  6:00 AM    No components found with this basename: hga1c    No results found for this  basename: AST    Assessment and Plan  Hypertension/Chest pain  Patient is currently on Lasix Bystolic lisinopril Imdur and hydralazine, advised  patient for DASH diet, currently following up with his cardiologist.  DM (diabetes mellitus) - Plan: Nearly diagnosed, his hemoglobin A1c is 7.5%, will start him on metformin 500 mg twice a day, prescription for glucometer, advised to keep the fingerstick log her  metFORMIN (GLUCOPHAGE) 500 MG tablet, glucose monitoring kit (FREESTYLE) monitoring kit  TIA (transient ischemic attack)  Obesity, unspecified Diet and exercise.   Return in about 3 months (around 11/19/2013) for diabetes, hypertension.  Lorayne Marek, MD

## 2013-08-20 NOTE — Patient Instructions (Addendum)
DASH Diet The DASH diet stands for "Dietary Approaches to Stop Hypertension." It is a healthy eating plan that has been shown to reduce high blood pressure (hypertension) in as little as 14 days, while also possibly providing other significant health benefits. These other health benefits include reducing the risk of breast cancer after menopause and reducing the risk of type 2 diabetes, heart disease, colon cancer, and stroke. Health benefits also include weight loss and slowing kidney failure in patients with chronic kidney disease.  DIET GUIDELINES  Limit salt (sodium). Your diet should contain less than 1500 mg of sodium daily.  Limit refined or processed carbohydrates. Your diet should include mostly whole grains. Desserts and added sugars should be used sparingly.  Include small amounts of heart-healthy fats. These types of fats include nuts, oils, and tub margarine. Limit saturated and trans fats. These fats have been shown to be harmful in the body. CHOOSING FOODS  The following food groups are based on a 2000 calorie diet. See your Registered Dietitian for individual calorie needs. Grains and Grain Products (6 to 8 servings daily)  Eat More Often: Whole-wheat bread, brown rice, whole-grain or wheat pasta, quinoa, popcorn without added fat or salt (air popped).  Eat Less Often: White bread, white pasta, white rice, cornbread. Vegetables (4 to 5 servings daily)  Eat More Often: Fresh, frozen, and canned vegetables. Vegetables may be raw, steamed, roasted, or grilled with a minimal amount of fat.  Eat Less Often/Avoid: Creamed or fried vegetables. Vegetables in a cheese sauce. Fruit (4 to 5 servings daily)  Eat More Often: All fresh, canned (in natural juice), or frozen fruits. Dried fruits without added sugar. One hundred percent fruit juice ( cup [237 mL] daily).  Eat Less Often: Dried fruits with added sugar. Canned fruit in light or heavy syrup. Lean Meats, Fish, and Poultry (2  servings or less daily. One serving is 3 to 4 oz [85-114 g]).  Eat More Often: Ninety percent or leaner ground beef, tenderloin, sirloin. Round cuts of beef, chicken breast, turkey breast. All fish. Grill, bake, or broil your meat. Nothing should be fried.  Eat Less Often/Avoid: Fatty cuts of meat, turkey, or chicken leg, thigh, or wing. Fried cuts of meat or fish. Dairy (2 to 3 servings)  Eat More Often: Low-fat or fat-free milk, low-fat plain or light yogurt, reduced-fat or part-skim cheese.  Eat Less Often/Avoid: Milk (whole, 2%).Whole milk yogurt. Full-fat cheeses. Nuts, Seeds, and Legumes (4 to 5 servings per week)  Eat More Often: All without added salt.  Eat Less Often/Avoid: Salted nuts and seeds, canned beans with added salt. Fats and Sweets (limited)  Eat More Often: Vegetable oils, tub margarines without trans fats, sugar-free gelatin. Mayonnaise and salad dressings.  Eat Less Often/Avoid: Coconut oils, palm oils, butter, stick margarine, cream, half and half, cookies, candy, pie. FOR MORE INFORMATION The Dash Diet Eating Plan: www.dashdiet.org Document Released: 04/05/2011 Document Revised: 07/09/2011 Document Reviewed: 04/05/2011 [employer] Patient Information 2014 [employer]. Diabetes Meal Planning Guide The diabetes meal planning guide is a tool to help you plan your meals and snacks. It is important for people with diabetes to manage their blood glucose (sugar) levels. Choosing the right foods and the right amounts throughout your day will help control your blood glucose. Eating right can even help you improve your blood pressure and reach or maintain a healthy weight. CARBOHYDRATE COUNTING MADE EASY When you eat carbohydrates, they turn to sugar. This raises your blood glucose level. Counting carbohydrates   can help you control this level so you feel better. When you plan your meals by counting carbohydrates, you can have more flexibility in what you eat and balance  your medicine with your food intake. Carbohydrate counting simply means adding up the total amount of carbohydrate grams in your meals and snacks. Try to eat about the same amount at each meal. Foods with carbohydrates are listed below. Each portion below is 1 carbohydrate serving or 15 grams of carbohydrates. Ask your dietician how many grams of carbohydrates you should eat at each meal or snack. Grains and Starches  1 slice bread.   English muffin or hotdog/hamburger bun.   cup cold cereal (unsweetened).   cup cooked pasta or rice.   cup starchy vegetables (corn, potatoes, peas, beans, winter squash).  1 tortilla (6 inches).   bagel.  1 waffle or pancake (size of a CD).   cup cooked cereal.  4 to 6 small crackers. *Whole grain is recommended. Fruit  1 cup fresh unsweetened berries, melon, papaya, pineapple.  1 small fresh fruit.   banana or mango.   cup fruit juice (4 oz unsweetened).   cup canned fruit in natural juice or water.  2 tbs dried fruit.  12 to 15 grapes or cherries. Milk and Yogurt  1 cup fat-free or 1% milk.  1 cup soy milk.  6 oz light yogurt with sugar-free sweetener.  6 oz low-fat soy yogurt.  6 oz plain yogurt. Vegetables  1 cup raw or  cup cooked is counted as 0 carbohydrates or a "free" food.  If you eat 3 or more servings at 1 meal, count them as 1 carbohydrate serving. Other Carbohydrates   oz chips or pretzels.   cup ice cream or frozen yogurt.   cup sherbet or sorbet.  2 inch square cake, no frosting.  1 tbs honey, sugar, jam, jelly, or syrup.  2 small cookies.  3 squares of graham crackers.  3 cups popcorn.  6 crackers.  1 cup broth-based soup.  Count 1 cup casserole or other mixed foods as 2 carbohydrate servings.  Foods with less than 20 calories in a serving may be counted as 0 carbohydrates or a "free" food. You may want to purchase a book or computer software that lists the carbohydrate gram  counts of different foods. In addition, the nutrition facts panel on the labels of the foods you eat are a good source of this information. The label will tell you how big the serving size is and the total number of carbohydrate grams you will be eating per serving. Divide this number by 15 to obtain the number of carbohydrate servings in a portion. Remember, 1 carbohydrate serving equals 15 grams of carbohydrate. SERVING SIZES Measuring foods and serving sizes helps you make sure you are getting the right amount of food. The list below tells how big or small some common serving sizes are.  1 oz.........4 stacked dice.  3 oz.........Deck of cards.  1 tsp........Tip of little finger.  1 tbs........Thumb.  2 tbs........Golf ball.   cup.......Half of a fist.  1 cup........A fist. SAMPLE DIABETES MEAL PLAN Below is a sample meal plan that includes foods from the grain and starches, dairy, vegetable, fruit, and meat groups. A dietician can individualize a meal plan to fit your calorie needs and tell you the number of servings needed from each food group. However, controlling the total amount of carbohydrates in your meal or snack is more important than making sure you   include all of the food groups at every meal. You may interchange carbohydrate containing foods (dairy, starches, and fruits). The meal plan below is an example of a 2000 calorie diet using carbohydrate counting. This meal plan has 17 carbohydrate servings. Breakfast  1 cup oatmeal (2 carb servings).   cup light yogurt (1 carb serving).  1 cup blueberries (1 carb serving).   cup almonds. Snack  1 large apple (2 carb servings).  1 low-fat string cheese stick. Lunch  Chicken breast salad.  1 cup spinach.   cup chopped tomatoes.  2 oz chicken breast, sliced.  2 tbs low-fat [place] dressing.  12 whole-wheat crackers (2 carb servings).  12 to 15 grapes (1 carb serving).  1 cup low-fat milk (1 carb  serving). Snack  1 cup carrots.   cup hummus (1 carb serving). Dinner  3 oz broiled salmon.  1 cup brown rice (3 carb servings). Snack  1  cups steamed broccoli (1 carb serving) drizzled with 1 tsp olive oil and lemon juice.  1 cup light pudding (2 carb servings). DIABETES MEAL PLANNING WORKSHEET Your dietician can use this worksheet to help you decide how many servings of foods and what types of foods are right for you.  BREAKFAST Food Group and Servings / Carb Servings Grain/Starches __________________________________ Dairy __________________________________________ Vegetable ______________________________________ Fruit ___________________________________________ Meat __________________________________________ Fat ____________________________________________ LUNCH Food Group and Servings / Carb Servings Grain/Starches ___________________________________ Dairy ___________________________________________ Fruit ____________________________________________ Meat ___________________________________________ Fat _____________________________________________ Laural Golden Food Group and Servings / Carb Servings Grain/Starches ___________________________________ Dairy ___________________________________________ Fruit ____________________________________________ Meat ___________________________________________ Fat _____________________________________________ SNACKS Food Group and Servings / Carb Servings Grain/Starches ___________________________________ Dairy ___________________________________________ Vegetable _______________________________________ Fruit ____________________________________________ Meat ___________________________________________ Fat _____________________________________________ DAILY TOTALS Starches _________________________ Vegetable ________________________ Fruit ____________________________ Dairy ____________________________ Meat  ____________________________ Fat ______________________________ Document Released: 01/11/2005 Document Revised: 07/09/2011 Document Reviewed: 11/22/2008 Exit[employer] Patient Information 2014 [employer]. Diabetes Meal Planning Guide The diabetes meal planning guide is a tool to help you plan your meals and snacks. It is important for people with diabetes to manage their blood glucose (sugar) levels. Choosing the right foods and the right amounts throughout your day will help control your blood glucose. Eating right can even help you improve your blood pressure and reach or maintain a healthy weight. CARBOHYDRATE COUNTING MADE EASY When you eat carbohydrates, they turn to sugar. This raises your blood glucose level. Counting carbohydrates can help you control this level so you feel better. When you plan your meals by counting carbohydrates, you can have more flexibility in what you eat and balance your medicine with your food intake. Carbohydrate counting simply means adding up the total amount of carbohydrate grams in your meals and snacks. Try to eat about the same amount at each meal. Foods with carbohydrates are listed below. Each portion below is 1 carbohydrate serving or 15 grams of carbohydrates. Ask your dietician how many grams of carbohydrates you should eat at each meal or snack. Grains and Starches  1 slice bread.   English muffin or hotdog/hamburger bun.   cup cold cereal (unsweetened).   cup cooked pasta or rice.   cup starchy vegetables (corn, potatoes, peas, beans, winter squash).  1 tortilla (6 inches).   bagel.  1 waffle or pancake (size of a CD).   cup cooked cereal.  4 to 6 small crackers. *Whole grain is recommended. Fruit  1 cup fresh unsweetened berries, melon, papaya, pineapple.  1 small fresh fruit.   banana or mango.   cup fruit juice (4  oz unsweetened).   cup canned fruit in natural juice or water.  2 tbs dried fruit.  12 to 15 grapes or  cherries. Milk and Yogurt  1 cup fat-free or 1% milk.  1 cup soy milk.  6 oz light yogurt with sugar-free sweetener.  6 oz low-fat soy yogurt.  6 oz plain yogurt. Vegetables  1 cup raw or  cup cooked is counted as 0 carbohydrates or a "free" food.  If you eat 3 or more servings at 1 meal, count them as 1 carbohydrate serving. Other Carbohydrates   oz chips or pretzels.   cup ice cream or frozen yogurt.   cup sherbet or sorbet.  2 inch square cake, no frosting.  1 tbs honey, sugar, jam, jelly, or syrup.  2 small cookies.  3 squares of graham crackers.  3 cups popcorn.  6 crackers.  1 cup broth-based soup.  Count 1 cup casserole or other mixed foods as 2 carbohydrate servings.  Foods with less than 20 calories in a serving may be counted as 0 carbohydrates or a "free" food. You may want to purchase a book or computer software that lists the carbohydrate gram counts of different foods. In addition, the nutrition facts panel on the labels of the foods you eat are a good source of this information. The label will tell you how big the serving size is and the total number of carbohydrate grams you will be eating per serving. Divide this number by 15 to obtain the number of carbohydrate servings in a portion. Remember, 1 carbohydrate serving equals 15 grams of carbohydrate. SERVING SIZES Measuring foods and serving sizes helps you make sure you are getting the right amount of food. The list below tells how big or small some common serving sizes are.  1 oz.........4 stacked dice.  3 oz........[place] KitchenDeck of cards.  1 tsp.......Mar[place] KitchenTip of little finger.  1 tbs......Marland KitchenMarland KitchenThumb.  2 tbs.......Marland KitchenGolf ball.   cup......Marland KitchenHalf of a fist.  1 cup.......Marland KitchenA fist. SAMPLE DIABETES MEAL PLAN Below is a sample meal plan that includes foods from the grain and starches, dairy, vegetable, fruit, and meat groups. A dietician can individualize a meal plan to fit your calorie needs and tell you  the number of servings needed from each food group. However, controlling the total amount of carbohydrates in your meal or snack is more important than making sure you include all of the food groups at every meal. You may interchange carbohydrate containing foods (dairy, starches, and fruits). The meal plan below is an example of a 2000 calorie diet using carbohydrate counting. This meal plan has 17 carbohydrate servings. Breakfast  1 cup oatmeal (2 carb servings).   cup light yogurt (1 carb serving).  1 cup blueberries (1 carb serving).   cup almonds. Snack  1 large apple (2 carb servings).  1 low-fat string cheese stick. Lunch  Chicken breast salad.  1 cup spinach.   cup chopped tomatoes.  2 oz chicken breast, sliced.  2 tbs low-fat [place] dressing.  12 whole-wheat crackers (2 carb servings).  12 to 15 grapes (1 carb serving).  1 cup low-fat milk (1 carb serving). Snack  1 cup carrots.   cup hummus (1 carb serving). Dinner  3 oz broiled salmon.  1 cup brown rice (3 carb servings). Snack  1  cups steamed broccoli (1 carb serving) drizzled with 1 tsp olive oil and lemon juice.  1 cup light pudding (2 carb servings). DIABETES MEAL PLANNING WORKSHEET Your dietician can use  this worksheet to help you decide how many servings of foods and what types of foods are right for you.  BREAKFAST Food Group and Servings / Carb Servings Grain/Starches __________________________________ Dairy __________________________________________ Vegetable ______________________________________ Fruit ___________________________________________ Meat __________________________________________ Fat ____________________________________________ LUNCH Food Group and Servings / Carb Servings Grain/Starches ___________________________________ Dairy ___________________________________________ Fruit ____________________________________________ Meat  ___________________________________________ Fat _____________________________________________ Laural GoldenINNER Food Group and Servings / Carb Servings Grain/Starches ___________________________________ Dairy ___________________________________________ Fruit ____________________________________________ Meat ___________________________________________ Fat _____________________________________________ SNACKS Food Group and Servings / Carb Servings Grain/Starches ___________________________________ Dairy ___________________________________________ Vegetable _______________________________________ Fruit ____________________________________________ Meat ___________________________________________ Fat _____________________________________________ DAILY TOTALS Starches _________________________ Vegetable ________________________ Fruit ____________________________ Dairy ____________________________ Meat ____________________________ Fat ______________________________ Document Released: 01/11/2005 Document Revised: 07/09/2011 Document Reviewed: 11/22/2008 [employer] Patient Information 2014 [employer]

## 2013-08-20 NOTE — Telephone Encounter (Signed)
Message copied by Loa Socks on Thu Aug 20, 2013  7:01 PM ------      Message from: Lars Masson      Created: Thu Aug 20, 2013  5:47 PM       His diabetes in uncontrolled, we will prescribe him metformin 500 mg po BID.      Thank you,      KN ------

## 2013-08-20 NOTE — Progress Notes (Signed)
Patient here for follow up Complain of joint pain States has a lot of  Mucous in his throat and sometimes When he spits it up it has some blood mixed in it Saw his cardiologist yesterday and she made some changes in his medications He is now taking 75 mg metoprolol The apresoline is 50mg  three times a day

## 2013-08-20 NOTE — Telephone Encounter (Signed)
Message copied by Loa Socks on Thu Aug 20, 2013  6:59 PM ------      Message from: Lars Masson      Created: Thu Aug 20, 2013  5:47 PM       His diabetes in uncontrolled, we will prescribe him metformin 500 mg po BID.      Thank you,      KN ------

## 2013-08-21 ENCOUNTER — Ambulatory Visit: Payer: No Typology Code available for payment source | Attending: Internal Medicine

## 2013-08-21 ENCOUNTER — Other Ambulatory Visit: Payer: Self-pay

## 2013-08-21 MED ORDER — NEBIVOLOL HCL 10 MG PO TABS: 10.0000 mg | ORAL_TABLET | Freq: Every day | ORAL | Status: DC

## 2013-08-24 NOTE — Telephone Encounter (Signed)
lmtcb about Dr Joanie Coddington review and recommendations on uncontrolled diabetes.

## 2013-08-24 NOTE — Telephone Encounter (Signed)
Message copied by Loa Socks on Mon Aug 24, 2013  4:55 PM ------      Message from: Lars Masson      Created: Thu Aug 20, 2013  5:47 PM       His diabetes in uncontrolled, we will prescribe him metformin 500 mg po BID.      Thank you,      KN ------

## 2013-08-25 ENCOUNTER — Encounter: Payer: Self-pay | Admitting: *Deleted

## 2013-08-25 NOTE — Telephone Encounter (Signed)
Tried contacting pt multiple times regarding Dr Joanie Coddington review and recommendation for uncontrolled diabetes. LMTCB. Also sent pt a letter to home address listed in epic.

## 2013-08-25 NOTE — Telephone Encounter (Signed)
Message copied by Loa Socks on Tue Aug 25, 2013  1:27 PM ------      Message from: Lars Masson      Created: Thu Aug 20, 2013  5:47 PM       His diabetes in uncontrolled, we will prescribe him metformin 500 mg po BID.      Thank you,      KN ------

## 2013-08-25 NOTE — Telephone Encounter (Signed)
Spoke with pt about Dr Joanie Coddington recommendation for pt to continue taking Metformin 500 mg BID. Also advised pt about glycemic diet control. Advised him of different web sites to visit about diets with Hyperglycemic pts. Pt verbalized understanding and pleased with follow-up.

## 2013-08-25 NOTE — Telephone Encounter (Signed)
°  Patient is returning your call, please call back.  °

## 2013-09-09 ENCOUNTER — Telehealth: Payer: Self-pay | Admitting: *Deleted

## 2013-09-09 ENCOUNTER — Encounter: Payer: Self-pay | Admitting: Cardiology

## 2013-09-09 ENCOUNTER — Ambulatory Visit (INDEPENDENT_AMBULATORY_CARE_PROVIDER_SITE_OTHER): Payer: No Typology Code available for payment source | Admitting: Cardiology

## 2013-09-09 VITALS — BP 110/62 | HR 84 | Ht 75.0 in | Wt 246.0 lb

## 2013-09-09 DIAGNOSIS — E119 Type 2 diabetes mellitus without complications: Secondary | ICD-10-CM

## 2013-09-09 DIAGNOSIS — R079 Chest pain, unspecified: Secondary | ICD-10-CM

## 2013-09-09 DIAGNOSIS — I10 Essential (primary) hypertension: Secondary | ICD-10-CM

## 2013-09-09 DIAGNOSIS — R002 Palpitations: Secondary | ICD-10-CM

## 2013-09-09 DIAGNOSIS — R202 Paresthesia of skin: Secondary | ICD-10-CM

## 2013-09-09 MED ORDER — NEBIVOLOL HCL 10 MG PO TABS: ORAL_TABLET | ORAL | Status: DC

## 2013-09-09 NOTE — Telephone Encounter (Signed)
Called pt and lmtcb to inform him of new order per Dr Delton See for pt to have a repeat BMP in 2 months.  Order was placed.  Pt hemoglobin a1c was elevated so per Dr Delton See pt needs to continue taking his Metformin 500 mg bid.  Pt should continue taking his lasix as well. Will continue to follow up with pt to get lab appointment scheduled.  6 month f/u in recall.

## 2013-09-09 NOTE — Patient Instructions (Signed)
START TAKING YOUR BYSTOLIC AT BEDTIME  Your physician wants you to follow-up in: 6 MONTHS WITH DR Johnell Comings will receive a reminder letter in the mail two months in advance. If you don't receive a letter, please call our office to schedule the follow-up appointment.

## 2013-09-09 NOTE — Progress Notes (Signed)
Patient ID: Joseph Nuttingrtari Stander, male   DOB: June 22, 1980, 33 y.o.   MRN: [id number]    Patient Name: Joseph Shannon Date of Encounter: 09/09/2013  Primary Care Provider:  No PCP Per Patient Primary Cardiologist:  Lars MassonKatarina H Morton Simson  Patient Profile  Post-hospitalization visit  Problem List   Past Medical History  Diagnosis Date  . Hypertension   . Asthma    No past surgical history on file.  Allergies  No Known Allergies  HPI  33 year old gentleman with history of hypertension and asthma who was seen in the emergency department for complaints of cough and chest pain and left sided weakness.   In May 2014 the patient presented with intermittent h/a for 4 days with elevated BP, but progressed to central chest tight and episode of coughing, BP elevated at 172/113. Cardiopulm exam benign. Neuro exam benign. EKG unchanged from prior. Pt admitted 5/13 for TIA/HTN, found to have possible apical hypokinesis, and benign appearing pineal cyst. He was started on lopressor & ACE inhibitor, asked to f/u with PCP. He has not yet been able to establish with one and has not been able to have stress testing as directed. CXR unremarkable, trop neg x2. Pt's h/a, CP, and BP improved after 1 SL NTG.   05/18/2013 The patient is coming here today and he states that he has just been almost daily, retrosternal pressure-like nonexertional but sometimes they feel like they're relieved by rest, he hasn't tried to use nitroglycerin with it.  The patient is very concerned as his mom with an early 5550s just had a major myocardial infarction. His cousin just had a left ventricular assist device placed heart failure, the patient is not aware of etiology. The patient is asymptomatic. He brings a BP diary, his BP is consistently elevated with values in 150-190 mmHg range.  Imdur 30 mg po daily and hydralazine 25 mg po TID was prescribed.  08/19/2013 - complains of DOE, LE edema, hands and feet tingling. He is also  complaining about multiple small and large joint pain, he states that different arthritis types run in his family.  09/09/2013 - the patient comes today also mildly stating that his symptoms are all resolved. He is compliant with all his meds and he is able to afford them other than Bystolic. He has been approved for hardship and he'll be getting a one-[facility] supply [facility]. He denies any chest pain, shortness of breath, palpitation, syncope or lower extremity edema.  Home Medications  Prior to Admission medications   Medication Sig Start Date End Date Taking? Authorizing Provider  albuterol (PROVENTIL HFA;VENTOLIN HFA) 108 (90 BASE) MCG/ACT inhaler Inhale 2 puffs into the lungs daily.    Yes Historical Provider, MD  albuterol (PROVENTIL) (2.5 MG/3ML) 0.083% nebulizer solution Take 2.5 mg by nebulization every 6 (six) hours as needed for wheezing.   Yes Historical Provider, MD  fluticasone (FLOVENT HFA) 110 MCG/ACT inhaler Inhale 1 puff into the lungs 2 (two) times daily as needed (for wheezing).    Yes Historical Provider, MD  lisinopril-hydrochlorothiazide (PRINZIDE,ZESTORETIC) 20-25 MG per tablet Take 2 tablets by mouth daily. 02/04/13  Yes Alison MurrayAlma M Devine, MD  metoprolol tartrate (LOPRESSOR) 25 MG tablet Take 1 tablet (25 mg total) by mouth 2 (two) times daily. 02/04/13  Yes Alison MurrayAlma M Devine, MD    Family History  Family History  Problem Relation Age of Onset  . Hypertension Father   . Heart disease Father   . Hypertension Mother   .  Heart disease Mother   . Heart disease Paternal Uncle   . Heart disease Maternal Grandmother     Social History  History   Social History  . Marital Status: Single    Spouse Name: N/A    Number of Children: N/A  . Years of Education: N/A   Occupational History  . Not on file.   Social History Main Topics  . Smoking status: Former Smoker -- 0.50 packs/day    Types: Cigarettes  . Smokeless tobacco: Not on file  .  Alcohol Use: No     Comment: Socially Only  . Drug Use: No  . Sexual Activity: Not on file   Other Topics Concern  . Not on file   Social History Narrative  . No narrative on file     Review of Systems, as per history of present illness General:  No chills, fever, night sweats or weight changes.  Cardiovascular:  No chest pain, dyspnea on exertion, edema, orthopnea, palpitations, paroxysmal nocturnal dyspnea. Dermatological: No rash, lesions/masses Respiratory: No cough, dyspnea Urologic: No hematuria, dysuria Abdominal:   No nausea, vomiting, diarrhea, bright red blood per rectum, melena, or hematemesis Neurologic:  No visual changes, wkns, changes in mental status. All other systems reviewed and are otherwise negative except as noted above.  Physical Exam  General: Pleasant, NAD Psych: Normal affect. Neuro: Alert and oriented X 3. Moves all extremities spontaneously. HEENT: Normal  Neck: Supple without bruits or JVD. Lungs:  Resp regular and unlabored, CTA. Heart: RRR no s3, s4, or murmurs. Abdomen: Soft, non-tender, non-distended, BS + x 4.  Extremities: No clubbing, cyanosis, LE edema. DP/PT/Radials 2+ and equal bilaterally.  Accessory Clinical Findings  ECG - sinus rhythm 88 beats per minute, normal EKG  Lipid Panel     Component Value Date/Time   CHOL 200 09/19/2012 0600   TRIG 178* 09/19/2012 0600   HDL 42 09/19/2012 0600   CHOLHDL 4.8 09/19/2012 0600   VLDL 36 09/19/2012 0600   LDLCALC 122* 09/19/2012 0600   Duplex carotids; 09/19/12 Summary:  - No significant extracranial carotid artery stenosis demonstrated. Vertebrals are patent with antegrade flow. -  TTE 09/19/12 Study Conclusions  - Left ventricle: The cavity size was normal. Wall thickness was increased in a pattern of mild LVH. Systolic function was normal. The estimated ejection fraction was in the range of 50% to 55%. Possible hypokinesis of the apical myocardium. Left ventricular diastolic  function parameters were normal. - Left atrium: The atrium was mildly dilated. Impressions:  - Possible small area of apical hypokinesis; suggest fu study with contrast to further evaluate if clinically indicated.   Exercise nuclear stress test: 02/26/2013 Quantitative Gated Spect Images  QGS EDV: 165 ml  QGS ESV: 90 ml  Impression  Exercise Capacity: Good exercise capacity.  BP Response: Hypertensive blood pressure response.  Clinical Symptoms: No chest pain.  ECG Impression: No significant ST segment change suggestive of ischemia.  Comparison with Prior Nuclear Study: No images to compare  Overall Impression: Low risk stress nuclear study. No evidence of ischemia. Good exercise tolerance. LV systolic function is mildly depressed.  LV Ejection Fraction: 45%. LV Wall Motion: Normal Wall Motion. No segmental wall motion abnormalities. EF mildly depressed.  Cassell Clement   Assessment & Plan  33 year old gentleman with a history of hypertension, possible TIA, pineal cyst, with significant family history of CAD with coming for concerns of chest pain.  1. Chest pain - The patient has a possible hypokinetic apex on transthoracic  echocardiogram. Exercise nuclear stress test showed mildly decreased LV EF 45% with no perfusion defect. He had good exercise capacity but hypertensive response to exercise. His pain is completely resolved now with good blood pressure control.  2. Hypertension - finally controlled on this regimen:  Lasix 40 mg po daily Bystolic 10 mg po daily Imdur 30 mg po daily Hydralazine 50 mg po TID Lisinopril 40 mg po daily  3. Palpitations, history of TIA, mildly dilated left atrium - the patient couldn't efford eCardio monitor, he was enrolled for [employer] for [employer]. 24H Holter was completely normal, no a-fib. Palpitations now resolved  4. LE edema - resolved after we initiated Lasix.  5. Elevated CBG, paresthesias, weight gain -  HbA1c - we will start  metformin 500 mg twice a day.  6. Polyarthralgia - we will draw ANA and RF today  Followup in 6 months, BMP in 2 months.Lars Masson, MD 09/09/2013, 2:40 PM

## 2013-09-09 NOTE — Telephone Encounter (Signed)
Message copied by Loa Socks on Wed Sep 09, 2013  5:37 PM ------      Message from: Lars Masson      Created: Wed Sep 09, 2013  4:17 PM       Lajoyce Corners,            For this patient we will need to repeat been at in 2 months and he was just started on Lasix.      Also I would appreciate if you could call him and let him know that his hemoglobin A1c was elevated at 7.5 with normal being less than 6 and therefore I would like to start him on metformin 500 milligrams twice daily.            Thank you,            Aris Lot ------

## 2013-09-10 ENCOUNTER — Encounter: Payer: Self-pay | Admitting: *Deleted

## 2013-09-10 NOTE — Telephone Encounter (Signed)
LMTCB about Dr Delton See recommendation for pt to continue taking his Lasix daily, and continue to take his Metformin 500  Mg bid.  Pt needs to have a repeat BMP in 2 months. Order placed in epic.  Will continue to follow-up with pt and send him a letter as well.

## 2013-09-14 ENCOUNTER — Telehealth: Payer: Self-pay | Admitting: *Deleted

## 2013-09-14 NOTE — Telephone Encounter (Signed)
Message copied by Loa Socks on Mon Sep 14, 2013  2:33 PM ------      Message from: Lars Masson      Created: Wed Sep 09, 2013  4:17 PM       Lajoyce Corners,            For this patient we will need to repeat been at in 2 months and he was just started on Lasix.      Also I would appreciate if you could call him and let him know that his hemoglobin A1c was elevated at 7.5 with normal being less than 6 and therefore I would like to start him on metformin 500 milligrams twice daily.            Thank you,            Aris Lot ------

## 2013-09-14 NOTE — Telephone Encounter (Signed)
Have called pt numerous times with no response.  Letter sent in the mail to pt with no return feedback.  Dr Delton See made aware of no response.

## 2013-09-14 NOTE — Telephone Encounter (Signed)
Message copied by Loa Socks on Mon Sep 14, 2013 11:34 AM ------      Message from: Lars Masson      Created: Wed Sep 09, 2013  4:17 PM       Lajoyce Corners,            For this patient we will need to repeat been at in 2 months and he was just started on Lasix.      Also I would appreciate if you could call him and let him know that his hemoglobin A1c was elevated at 7.5 with normal being less than 6 and therefore I would like to start him on metformin 500 milligrams twice daily.            Thank you,            Aris Lot ------

## 2013-09-14 NOTE — Telephone Encounter (Signed)
Have tried contacting pt back and no response.  New orders per Dr Delton See for pt to have repeat labs in 2 months and start taking Lasix.  Also pt needs to take his metformin 500 mg bid.  Will let Dr Delton See know this pt has not returned my phone call after several attempts and letter being sent out.

## 2013-09-14 NOTE — Telephone Encounter (Signed)
Pt did not answer phone but brother Darryl did answer and said he would pass the message along to his brother to call here at the office so we can update pt on new med orders per Dr Delton See.

## 2013-09-17 NOTE — Telephone Encounter (Signed)
LMTCB about new orders and recommendations from Dr Delton See from 5/13.  Letter was mailed to pts address and spoke with Pts Brother Darryl and was told he would relay message to pt to call the office.  Still no reponse.

## 2013-09-22 NOTE — Telephone Encounter (Signed)
LMTCB concerning new orders from Dr Delton See from 5/18.

## 2013-09-25 NOTE — Telephone Encounter (Signed)
lmtcb from note on 5/13.  Still no return call.

## 2014-03-08 ENCOUNTER — Telehealth: Payer: Self-pay | Admitting: Cardiology

## 2014-03-08 ENCOUNTER — Encounter: Payer: Self-pay | Admitting: Cardiology

## 2014-03-08 ENCOUNTER — Encounter: Payer: Self-pay | Admitting: *Deleted

## 2014-03-08 ENCOUNTER — Ambulatory Visit (INDEPENDENT_AMBULATORY_CARE_PROVIDER_SITE_OTHER): Payer: Self-pay | Admitting: Cardiology

## 2014-03-08 VITALS — BP 142/100 | HR 77 | Ht 75.0 in | Wt 247.0 lb

## 2014-03-08 DIAGNOSIS — I10 Essential (primary) hypertension: Secondary | ICD-10-CM

## 2014-03-08 DIAGNOSIS — R079 Chest pain, unspecified: Secondary | ICD-10-CM

## 2014-03-08 DIAGNOSIS — R0609 Other forms of dyspnea: Secondary | ICD-10-CM

## 2014-03-08 DIAGNOSIS — E1140 Type 2 diabetes mellitus with diabetic neuropathy, unspecified: Secondary | ICD-10-CM

## 2014-03-08 DIAGNOSIS — E119 Type 2 diabetes mellitus without complications: Secondary | ICD-10-CM | POA: Insufficient documentation

## 2014-03-08 LAB — COMPREHENSIVE METABOLIC PANEL
ALT: 22 U/L (ref 0–53)
AST: 18 U/L (ref 0–37)
Albumin: 3.5 g/dL (ref 3.5–5.2)
Alkaline Phosphatase: 62 U/L (ref 39–117)
BUN: 16 mg/dL (ref 6–23)
CO2: 27 mEq/L (ref 19–32)
Calcium: 8.9 mg/dL (ref 8.4–10.5)
Chloride: 106 mEq/L (ref 96–112)
Creatinine, Ser: 1 mg/dL (ref 0.4–1.5)
GFR: 107.9 mL/min (ref >60.00)
Glucose, Bld: 100 mg/dL — ABNORMAL HIGH (ref 70–99)
Potassium: 3.8 mEq/L (ref 3.5–5.1)
Sodium: 139 mEq/L (ref 135–145)
Total Bilirubin: 0.5 mg/dL (ref 0.2–1.2)
Total Protein: 7.3 g/dL (ref 6.0–8.3)

## 2014-03-08 LAB — LIPID PANEL
Cholesterol: 185 mg/dL (ref 0–200)
HDL: 43.7 mg/dL (ref >39.00)
LDL Cholesterol: 118 mg/dL — ABNORMAL HIGH (ref 0–99)
NonHDL: 141.3
Total CHOL/HDL Ratio: 4
Triglycerides: 119 mg/dL (ref 0.0–149.0)
VLDL: 23.8 mg/dL (ref 0.0–40.0)

## 2014-03-08 LAB — HEMOGLOBIN A1C: Hgb A1c MFr Bld: 6.4 % (ref 4.6–6.5)

## 2014-03-08 MED ORDER — LISINOPRIL 40 MG PO TABS: 40.0000 mg | ORAL_TABLET | Freq: Every day | ORAL | Status: DC

## 2014-03-08 MED ORDER — HYDRALAZINE HCL 25 MG PO TABS: ORAL_TABLET | ORAL | Status: DC

## 2014-03-08 MED ORDER — GABAPENTIN 100 MG PO CAPS: 100.0000 mg | ORAL_CAPSULE | Freq: Three times a day (TID) | ORAL | Status: DC

## 2014-03-08 MED ORDER — ISOSORBIDE MONONITRATE ER 60 MG PO TB24: 60.0000 mg | ORAL_TABLET | Freq: Every day | ORAL | Status: DC

## 2014-03-08 MED ORDER — PREGABALIN 75 MG PO CAPS: 75.0000 mg | ORAL_CAPSULE | Freq: Two times a day (BID) | ORAL | Status: DC

## 2014-03-08 NOTE — Patient Instructions (Signed)
Your physician has recommended you make the following change in your medication:   INCREASE YOUR HYDRALAZINE TO 75 MG THREE TIMES DAILY-  THIS MED COMES IN 25 MG TABLETS SO YOU SHOULD TAKE 3 TABLETS AT BREAKFAST, 3 TABLETS AT LUNCH, AND 3 TABLETS AT DINNER TO TOTAL 225 MG.   INCREASE YOUR IMDUR TO 60 MG DAILY  START TAKING LYRICA 75 MG TWICE DAILY     Your physician recommends that you return for lab work in: TODAY (HEMOGLOBIN A1C, LIPIDS, AND CMET)     Your physician recommends that you schedule a follow-up appointment in: 3 MONTHS WITH DR Delton See

## 2014-03-08 NOTE — Progress Notes (Signed)
Patient ID: Okoye Hague, male   DOB: 1980-12-24, 33 y.o.   MRN: [id number] Patient ID: Emryk Caulder, male   DOB: October 16, 1980, 33 y.o.   MRN: [id number]    Patient Name: Purl Blount Date of Encounter: 03/08/2014  Primary Care Provider:  No PCP Per Patient Primary Cardiologist:  Lars Masson  Patient Profile  Post-hospitalization visit  Problem List   Past Medical History  Diagnosis Date  . Hypertension   . Asthma    No past surgical history on file.  Allergies  No Known Allergies  HPI  33 year old gentleman with history of hypertension and asthma who was seen in the emergency department for complaints of cough and chest pain and left sided weakness.   In May 2014 the patient presented with intermittent h/a for 4 days with elevated BP, but progressed to central chest tight and episode of coughing, BP elevated at 172/113. Cardiopulm exam benign. Neuro exam benign. EKG unchanged from prior. Pt admitted 5/13 for TIA/HTN, found to have possible apical hypokinesis, and benign appearing pineal cyst. He was started on lopressor & ACE inhibitor, asked to f/u with PCP. He has not yet been able to establish with one and has not been able to have stress testing as directed. CXR unremarkable, trop neg x2. Pt's h/a, CP, and BP improved after 1 SL NTG.   05/18/2013 The patient is coming here today and he states that he has just been almost daily, retrosternal pressure-like nonexertional but sometimes they feel like they're relieved by rest, he hasn't tried to use nitroglycerin with it.  The patient is very concerned as his mom with an early 22s just had a major myocardial infarction. His cousin just had a left ventricular assist device placed heart failure, the patient is not aware of etiology. The patient is asymptomatic. He brings a BP diary, his BP is consistently elevated with values in 150-190 mmHg range.  Imdur 30 mg po daily and hydralazine 25 mg po TID was  prescribed.  08/19/2013 - complains of DOE, LE edema, hands and feet tingling. He is also complaining about multiple small and large joint pain, he states that different arthritis types run in his family.  09/09/2013 - the patient comes today also mildly stating that his symptoms are all resolved. He is compliant with all his meds and he is able to afford them other than Bystolic. He has been approved for hardship and he'll be getting a one-[facility] supply [facility]. He denies any chest pain, shortness of breath, palpitation, syncope or lower extremity edema.  03/08/2014 - the patient is coming for 6 months follow-up. He states that his blood pressure has been elevated but can. He arm again developed some mild shortness of breath with exertion. 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 He complaint to his meds. He tolerates metformin well. He complains of significant burst dizziness in both of his legs.  Home Medications  Prior to Admission medications   Medication Sig Start Date End Date Taking? Authorizing Provider  albuterol (PROVENTIL HFA;VENTOLIN HFA) 108 (90 BASE) MCG/ACT inhaler Inhale 2 puffs into the lungs daily.    Yes Historical Provider, MD  albuterol (PROVENTIL) (2.5 MG/3ML) 0.083% nebulizer solution Take 2.5 mg by nebulization every 6 (six) hours as needed for wheezing.   Yes Historical Provider, MD  fluticasone (FLOVENT HFA) 110 MCG/ACT inhaler Inhale 1 puff into the lungs 2 (two) times daily as needed (for wheezing).  Yes Historical Provider, MD  lisinopril-hydrochlorothiazide (PRINZIDE,ZESTORETIC) 20-25 MG per tablet Take 2 tablets by mouth daily. 02/04/13  Yes Alison MurrayAlma M Devine, MD  metoprolol tartrate (LOPRESSOR) 25 MG tablet Take 1 tablet (25 mg total) by mouth 2 (two) times daily. 02/04/13  Yes Alison MurrayAlma M Devine, MD    Family History  Family History  Problem Relation Age of Onset  . Hypertension Father   . Heart disease Father   .  Hypertension Mother   . Heart disease Mother   . Heart disease Paternal Uncle   . Heart disease Maternal Grandmother     Social History  History   Social History  . Marital Status: Single    Spouse Name: N/A    Number of Children: N/A  . Years of Education: N/A   Occupational History  . Not on file.   Social History Main Topics  . Smoking status: Former Smoker -- 0.50 packs/day    Types: Cigarettes  . Smokeless tobacco: Not on file  . Alcohol Use: No     Comment: Socially Only  . Drug Use: No  . Sexual Activity: Not on file   Other Topics Concern  . Not on file   Social History Narrative     Review of Systems, as per history of present illness General:  No chills, fever, night sweats or weight changes.  Cardiovascular:  No chest pain, dyspnea on exertion, edema, orthopnea, palpitations, paroxysmal nocturnal dyspnea. Dermatological: No rash, lesions/masses Respiratory: No cough, dyspnea Urologic: No hematuria, dysuria Abdominal:   No nausea, vomiting, diarrhea, bright red blood per rectum, melena, or hematemesis Neurologic:  No visual changes, wkns, changes in mental status. All other systems reviewed and are otherwise negative except as noted above.  Physical Exam  General: Pleasant, NAD Psych: Normal affect. Neuro: Alert and oriented X 3. Moves all extremities spontaneously. HEENT: Normal  Neck: Supple without bruits or JVD. Lungs:  Resp regular and unlabored, CTA. Heart: RRR no s3, s4, or murmurs. Abdomen: Soft, non-tender, non-distended, BS + x 4.  Extremities: No clubbing, cyanosis, LE edema. DP/PT/Radials 2+ and equal bilaterally.  Accessory Clinical Findings  ECG - sinus rhythm 88 beats per minute, normal EKG  Lipid Panel     Component Value Date/Time   CHOL 200 09/19/2012 0600   TRIG 178* 09/19/2012 0600   HDL 42 09/19/2012 0600   CHOLHDL 4.8 09/19/2012 0600   VLDL 36 09/19/2012 0600   LDLCALC 122* 09/19/2012 0600   Duplex carotids;  09/19/12 Summary:  - No significant extracranial carotid artery stenosis demonstrated. Vertebrals are patent with antegrade flow. -  TTE 09/19/12 Study Conclusions  - Left ventricle: The cavity size was normal. Wall thickness was increased in a pattern of mild LVH. Systolic function was normal. The estimated ejection fraction was in the range of 50% to 55%. Possible hypokinesis of the apical myocardium. Left ventricular diastolic function parameters were normal. - Left atrium: The atrium was mildly dilated. Impressions:  - Possible small area of apical hypokinesis; suggest fu study with contrast to further evaluate if clinically indicated.   Exercise nuclear stress test: 02/26/2013 Quantitative Gated Spect Images  QGS EDV: 165 ml  QGS ESV: 90 ml  Impression  Exercise Capacity: Good exercise capacity.  BP Response: Hypertensive blood pressure response.  Clinical Symptoms: No chest pain.  ECG Impression: No significant ST segment change suggestive of ischemia.  Comparison with Prior Nuclear Study: No images to compare  Overall Impression: Low risk stress nuclear study. No evidence of  ischemia. Good exercise tolerance. LV systolic function is mildly depressed.  LV Ejection Fraction: 45%. LV Wall Motion: Normal Wall Motion. No segmental wall motion abnormalities. EF mildly depressed.  Cassell Clement   Assessment & Plan  33 year old gentleman with a history of hypertension, possible TIA, pineal cyst, with significant family history of CAD with coming for concerns of chest pain.  1. Chest pain - The patient has a possible hypokinetic apex on transthoracic echocardiogram. Exercise nuclear stress test showed mildly decreased LV EF 45% with no perfusion defect. He had good exercise capacity but hypertensive response to exercise. His pain is completely resolved now with good blood pressure control.  2. Hypertension - we will increase   Lasix 40 mg po daily Bystolic 10 mg po  daily Increase Imdur from 30 mg to 60 mg po daily Increase Hydralazine from 50 mg to 75 mg po TID Lisinopril 40 mg po daily  3. Palpitations, history of TIA, mildly dilated left atrium - the patient couldn't efford eCardio monitor, he was enrolled for [employer] for [employer]. 24H Holter was completely normal, no a-fib. Palpitations now resolved  4. LE edema - resolved after we initiated Lasix.  5. Elevated CBG, paresthesias, weight gain -  HbA1c - we will start metformin 500 mg twice a day.  6. DM - new dg - we started him on Metformin, that he is tolerating well. He's complaining of significant diabetic neuropathy, we will add Lyrica 75 mg by mouth twice a day.   Followup in 2 months.   Lars Masson, MD 03/08/2014, 9:57 AM

## 2014-03-08 NOTE — Telephone Encounter (Signed)
Pt walked in after phone call about medications being filled.  Per Dr Delton See, we will switch his Lyrica to Gabapentin 100 mg TID due to cost effectiveness.  Pt verbalized understanding and agrees with this plan.

## 2014-03-08 NOTE — Addendum Note (Signed)
Addended by: Loa Socks on: 03/08/2014 12:21 PM   Modules accepted: Orders, Medications

## 2014-03-08 NOTE — Telephone Encounter (Signed)
New message     Pt was seen today.  He is now trying to get his presc filled but there is a problem.  Please call him.

## 2014-04-26 ENCOUNTER — Emergency Department (INDEPENDENT_AMBULATORY_CARE_PROVIDER_SITE_OTHER)
Admission: EM | Admit: 2014-04-26 | Discharge: 2014-04-26 | Disposition: A | Discharge status: Home / Self Care | Payer: Self-pay | Source: Home / Self Care | Attending: Emergency Medicine | Admitting: Emergency Medicine

## 2014-04-26 ENCOUNTER — Encounter (HOSPITAL_COMMUNITY): Payer: Self-pay | Admitting: Emergency Medicine

## 2014-04-26 DIAGNOSIS — S39012A Strain of muscle, fascia and tendon of lower back, initial encounter: Secondary | ICD-10-CM

## 2014-04-26 MED ORDER — METHOCARBAMOL 500 MG PO TABS: 500.0000 mg | ORAL_TABLET | Freq: Three times a day (TID) | ORAL | Status: DC

## 2014-04-26 MED ORDER — HYDROCODONE-ACETAMINOPHEN 5-325 MG PO TABS: ORAL_TABLET | ORAL | Status: DC

## 2014-04-26 NOTE — ED Notes (Signed)
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

## 2014-04-26 NOTE — Discharge Instructions (Signed)
Do exercises twice daily followed by moist heat for 15 minutes. ° ° ° ° ° °Try to be as active as possible. ° °If no better in 2 weeks, follow up with orthopedist. ° ° °

## 2014-04-26 NOTE — ED Provider Notes (Signed)
Chief Complaint   Back Pain   History of Present Illness   Joseph Shannon is a 33 year old male who has had a one-week history of lower back pain and he works as an Artist, and the day the pain began was lifting some heavy parts and after this his lower back began to hurt. He denies any radiation into the legs, numbness, tingling, or weakness. The back feels tight and stiff and hurts to bend, lift, or twist. He denies any bladder or bowel dysfunction or saddle anesthesia. No abdominal pain, nausea, or vomiting. No headache, fever, chills, stiff neck, or unexplained weight loss. He's had no prior history of back problems.  Review of Systems   Other than as noted above, the patient denies any of the following symptoms: Systemic:  No fever, chills, or unexplained weight loss. GI:  No abdominal pain or incontinence of bowel. GU:  No dysuria, frequency, urgency, or hematuria. No incontinence of urine or urinary retention.  M-S:  No neck pain or arthritis. Neuro:  No paresthesias, headache, saddle anesthesia, muscular weakness, or progressive neurological deficit.  PMFSH   Past medical history, family history, social history, meds, and allergies were reviewed. Specifically, there is no history of cancer, major trauma, osteoporosis, immunosuppression, or HIV infection. He has multiple medical problems including a history of a stroke, congestive heart failure, hypertension, asthma, and type 2 diabetes. He's on multiple meds including albuterol, Flovent, furosemide, gabapentin, a Presley, isosorbide mononitrate, lisinopril, metformin, Bystolic, and nitroglycerin.  Physical Examination    Vital signs:  BP 161/114 mmHg  Pulse 80  Temp(Src) 99 F (37.2 C) (Oral)  Resp 14  SpO2 97% General:  Alert, oriented, in no distress. Abdomen:  Soft, non-tender.  No organomegaly or mass.  No pulsatile midline abdominal mass or bruit. Back:  He has tenderness to palpation in the paralumbar  muscles of the mid to lower lumbar spine. His back has a limited range of motion with 30 of flexion, 10 of extension, 15 of lateral bending, and 50 of rotation with pain to straight leg raising produces lower back and buttock pain bilaterally but no radiating pain. Neuro:  Normal muscle strength, sensations and DTRs. Extremities: Pedal pulses were full, there was no edema. Skin:  Clear, warm and dry.  No rash.   Assessment   The encounter diagnosis was Lumbar strain, initial encounter.  No evidence of cauda equina syndrome, discitis, epidural abscess, fracture, acute pyelonephritis, bleed, cancer, or aneurism.    Plan     1.  Meds:  The following meds were prescribed:   Discharge Medication List as of 04/26/2014 12:42 PM    START taking these medications   Details  HYDROcodone-acetaminophen (NORCO/VICODIN) 5-325 MG per tablet 1 to 2 tabs every 4 to 6 hours as needed for pain., Print    methocarbamol (ROBAXIN) 500 MG tablet Take 1 tablet (500 mg total) by mouth 3 (three) times daily., Starting 04/26/2014, Until Discontinued, Normal        2.  Patient Education/Counseling:  The patient was given appropriate handouts, self care instructions, and instructed in symptomatic relief. The patient was encouraged to try to be as active as possible and given some exercises to do followed by moist heat.  3.  Follow up:  The patient was told to follow up here if no better in 3 to 4 days, or sooner if becoming worse in any way, and given some red flag symptoms such as worsening pain or new neurological symptoms which  would prompt immediate return.  Follow up with orthopedics if no improvement in 2-3 weeks.     Reuben Likesavid C Jett Fukuda, MD 04/26/14 1340

## 2014-05-08 ENCOUNTER — Emergency Department (INDEPENDENT_AMBULATORY_CARE_PROVIDER_SITE_OTHER)
Admission: EM | Admit: 2014-05-08 | Discharge: 2014-05-08 | Disposition: A | Discharge status: Home / Self Care | Payer: PRIVATE HEALTH INSURANCE | Source: Home / Self Care | Attending: Emergency Medicine | Admitting: Emergency Medicine

## 2014-05-08 ENCOUNTER — Encounter (HOSPITAL_COMMUNITY): Payer: Self-pay | Admitting: Emergency Medicine

## 2014-05-08 DIAGNOSIS — J069 Acute upper respiratory infection, unspecified: Secondary | ICD-10-CM

## 2014-05-08 DIAGNOSIS — J45901 Unspecified asthma with (acute) exacerbation: Secondary | ICD-10-CM

## 2014-05-08 MED ORDER — ALBUTEROL SULFATE HFA 108 (90 BASE) MCG/ACT IN AERS
2.0000 | INHALATION_SPRAY | RESPIRATORY_TRACT | Status: DC
  Administered 2014-05-08: 2 via RESPIRATORY_TRACT

## 2014-05-08 MED ORDER — IPRATROPIUM-ALBUTEROL 0.5-2.5 (3) MG/3ML IN SOLN
3.0000 mL | Freq: Once | RESPIRATORY_TRACT | Status: AC
  Administered 2014-05-08: 3 mL via RESPIRATORY_TRACT

## 2014-05-08 MED ORDER — ALBUTEROL SULFATE HFA 108 (90 BASE) MCG/ACT IN AERS
INHALATION_SPRAY | RESPIRATORY_TRACT | Status: AC
  Filled 2014-05-08: qty 6.7

## 2014-05-08 MED ORDER — ALBUTEROL SULFATE (2.5 MG/3ML) 0.083% IN NEBU
INHALATION_SOLUTION | RESPIRATORY_TRACT | Status: AC
  Filled 2014-05-08: qty 3

## 2014-05-08 MED ORDER — ALBUTEROL SULFATE (2.5 MG/3ML) 0.083% IN NEBU: INHALATION_SOLUTION | RESPIRATORY_TRACT | Status: DC

## 2014-05-08 MED ORDER — IPRATROPIUM-ALBUTEROL 0.5-2.5 (3) MG/3ML IN SOLN
RESPIRATORY_TRACT | Status: AC
  Filled 2014-05-08: qty 3

## 2014-05-08 MED ORDER — FLUTICASONE PROPIONATE HFA 110 MCG/ACT IN AERO: INHALATION_SPRAY | RESPIRATORY_TRACT | Status: DC

## 2014-05-08 MED ORDER — PREDNISONE 20 MG PO TABS: 20.0000 mg | ORAL_TABLET | Freq: Two times a day (BID) | ORAL | Status: DC

## 2014-05-08 MED ORDER — METHYLPREDNISOLONE ACETATE 40 MG/ML IJ SUSP
INTRAMUSCULAR | Status: AC
  Filled 2014-05-08: qty 3

## 2014-05-08 MED ORDER — ALBUTEROL SULFATE (2.5 MG/3ML) 0.083% IN NEBU
2.5000 mg | INHALATION_SOLUTION | Freq: Once | RESPIRATORY_TRACT | Status: AC
  Administered 2014-05-08: 2.5 mg via RESPIRATORY_TRACT

## 2014-05-08 MED ORDER — METHYLPREDNISOLONE ACETATE 80 MG/ML IJ SUSP
120.0000 mg | Freq: Once | INTRAMUSCULAR | Status: AC
  Administered 2014-05-08: 120 mg via INTRAMUSCULAR

## 2014-05-08 NOTE — Discharge Instructions (Signed)

## 2014-05-08 NOTE — ED Provider Notes (Signed)
Chief Complaint   Asthma and Cough   History of Present Illness   Jamarian Jacinto is a A 34 year old male with asthma who has had a two-week history of dry cough, wheezing, chest tightness, shortness of breath, and chest pain. He had a temperature of 101 last week, but none right now. He denies any nasal congestion, rhinorrhea, or sore throat. His asthma is usually controlled with albuterol by MDI inhaler or nebulizer and Flovent. He has been using his nebulizer at home but not gotten much improvement.  Review of Systems   Other than as noted above, the patient denies any of the following symptoms. Systemic:  No fever, chills, or sweats. ENT:  No nasal congestion, sneezing, rhinorrhea, or sore throat. Lungs:  No cough, sputum production, or shortness of breath. No chest pain. Skin:  No rash or itching.  PMFSH   Past medical history, family history, social history, meds, and allergies were reviewed. He has extensive medical history including hypertension, diabetes, and heart disease as well as stroke. Current meds include nitroglycerin, Bystolic, metformin, lisinopril, isosorbide mononitrate, hydralazine, gabapentin, furosemide, Flovent, and albuterol.  Physical Examination    Vital signs:  BP 174/112 mmHg  Pulse 84  Temp(Src) 98.4 F (36.9 C) (Oral)  Resp 16  SpO2 95% General:  Alert, in no distress. Able to speak in full sentences. Has normal respiratory effort.  Eye:  No conjunctival injection or drainage. Lids were normal. ENT:  TMs and canals were normal, without erythema or inflammation.  Nasal mucosa was clear and uncongested, without drainage.  Mucous membranes were moist.  Pharynx was clear, without exudate or drainage.  There were no oral ulcerations or lesions. Neck:  Supple, no adenopathy, tenderness or mass. Lungs:  No retractions or use of accessory muscles.  No respiratory distress.  Lungs were clear to auscultation, without wheezes, rales or rhonchi.  Breath  sounds were clear and equal bilaterally. Heart:  Regular rhythm, without gallops, murmers or rubs. Skin:  Clear, warm, and dry, without rash or lesions.  Course in Ur[facility] Center   The following medications were given:  Medications  methylPREDNISolone acetate (DEPO-MEDROL) injection 120 mg (120 mg Intramuscular Given 05/08/14 1407)  ipratropium-albuterol (DUONEB) 0.5-2.5 (3) MG/3ML nebulizer solution 3 mL (3 mLs Nebulization Given 05/08/14 1407)  albuterol (PROVENTIL) (2.5 MG/3ML) 0.083% nebulizer solution 2.5 mg (2.5 mg Nebulization Given 05/08/14 1407)    Lungs sounded good both before and after above treatments. There were no wheezes and good air movement bilaterally. No signs of respiratory distress. The patient states he did not feel much better after the breathing treatment, but when I gave him the option of going to the hospital he declines, stating that he like to go home and give the medicine time to work. If he gets to feeling any worse or has increasing difficulty breathing, he'll go straight to the emergency room.    Assessment   The primary encounter diagnosis was Asthma attack. A diagnosis of Viral URI was also pertinent to this visit.  Plan    1.  Meds:  The following meds were prescribed:   Discharge Medication List as of 05/08/2014  2:34 PM    START taking these medications   Details  !! albuterol (PROVENTIL) (2.5 MG/3ML) 0.083% nebulizer solution 1 to 2 amps via nebulizer every 4 hours as needed, Normal    !! fluticasone (FLOVENT HFA) 110 MCG/ACT inhaler 3 puffs twice daily, Print    predniSONE (DELTASONE) 20 MG tablet Take 1 tablet (  20 mg total) by mouth 2 (two) times daily., Starting 05/08/2014, Until Discontinued, Normal     !! - Potential duplicate medications found. Please discuss with provider.      2.  Patient Education/Counseling:  The patient was given appropriate handouts, self care instructions, and instructed in symptomatic relief.    3.  Follow up:   The patient was told to follow up here if no better in 2 days, or sooner if becoming worse in any way, and given some red flag symptoms such as increasing difficulty breathing which would prompt immediate return.         Reuben Likes, MD 05/08/14 2032

## 2014-05-08 NOTE — ED Notes (Signed)
Pt states that his asthma is flaring up and that his albuterol inhaler is not helping. Pt is in no acute distress at this time.

## 2014-06-22 ENCOUNTER — Ambulatory Visit: Payer: Self-pay | Admitting: Cardiology

## 2014-07-06 ENCOUNTER — Encounter: Payer: Self-pay | Admitting: Cardiology

## 2014-09-08 ENCOUNTER — Emergency Department (INDEPENDENT_AMBULATORY_CARE_PROVIDER_SITE_OTHER)
Admission: EM | Admit: 2014-09-08 | Discharge: 2014-09-08 | Disposition: A | Discharge status: Home / Self Care | Payer: PRIVATE HEALTH INSURANCE | Source: Home / Self Care | Attending: Family Medicine | Admitting: Family Medicine

## 2014-09-08 ENCOUNTER — Encounter (HOSPITAL_COMMUNITY): Payer: Self-pay | Admitting: Emergency Medicine

## 2014-09-08 DIAGNOSIS — R5383 Other fatigue: Secondary | ICD-10-CM | POA: Diagnosis not present

## 2014-09-08 LAB — POCT URINALYSIS DIP (DEVICE)
Bilirubin Urine: NEGATIVE
Glucose, UA: NEGATIVE mg/dL
HGB URINE DIPSTICK: NEGATIVE
KETONES UR: NEGATIVE mg/dL
Leukocytes, UA: NEGATIVE
Nitrite: NEGATIVE
PH: 6 (ref 5.0–8.0)
PROTEIN: NEGATIVE mg/dL
SPECIFIC GRAVITY, URINE: 1.025 (ref 1.005–1.030)
Urobilinogen, UA: 0.2 mg/dL (ref 0.0–1.0)

## 2014-09-08 LAB — POCT I-STAT, CHEM 8
BUN: 20 mg/dL (ref 6–20)
CREATININE: 1.1 mg/dL (ref 0.61–1.24)
Calcium, Ion: 1.2 mmol/L (ref 1.12–1.23)
Chloride: 100 mmol/L — ABNORMAL LOW (ref 101–111)
Glucose, Bld: 108 mg/dL — ABNORMAL HIGH (ref 70–99)
HEMATOCRIT: 50 % (ref 39.0–52.0)
Hemoglobin: 17 g/dL (ref 13.0–17.0)
POTASSIUM: 3.8 mmol/L (ref 3.5–5.1)
SODIUM: 139 mmol/L (ref 135–145)
TCO2: 29 mmol/L (ref 0–100)

## 2014-09-08 NOTE — ED Notes (Signed)
C/o feeling light headed this am... "I felt like passing out" Also reports fatigue and SOB, freq urination and thirst Hx of DM Alert, no sign of acute distress.

## 2014-09-08 NOTE — ED Provider Notes (Signed)
CSN: [id number]     Arrival date & time 09/08/14  1204 History   First MD Initiated Contact with Patient 09/08/14 1403     Chief Complaint  Patient presents with  . Dizziness   (Consider location/radiation/quality/duration/timing/severity/associated sxs/prior Treatment) HPI Comments: Patient presents with 1 week of intermittent dizziness and dyspnea upon exertion, lightheadedness, and increased urine output. States morning CBGs at home have been normal. Has been seen by cardiologist in the past (Dr. Liane Comber) and review of old records would indicate patient has had nearly normal myocardial perfusion study in Oct. 2014. States he has tried using his albuterol inhaler for dyspnea and this has provided him with little relief. Has not contacted any of his providers for evaluation. States he was sent home from work today because he complained of dizziness. 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 Denies chest pain, pedal edema, orthopnea, palpitations, nausea, diaphoresis. States he symptoms improve slightly with rest. Denies polydipsia. Is a smoker.   The history is provided by the patient.    Past Medical History  Diagnosis Date  . Hypertension   . Asthma    No past surgical history on file. Family History  Problem Relation Age of Onset  . Hypertension Father   . Heart disease Father   . Hypertension Mother   . Heart disease Mother   . Heart disease Paternal Uncle   . Heart disease Maternal Grandmother    History  Substance Use Topics  . Smoking status: Former Smoker -- 0.50 packs/day    Types: Cigarettes  . Smokeless tobacco: Not on file  . Alcohol Use: No     Comment: Socially Only    Review of Systems  Constitutional: Negative.   HENT: Negative.   Eyes: Negative.   Respiratory: Positive for shortness of breath. Negative for cough, chest tightness and wheezing.   Cardiovascular: Negative.   Gastrointestinal:  Negative.   Endocrine: Positive for polyuria. Negative for polydipsia and polyphagia.  Genitourinary: Positive for frequency.  Musculoskeletal: Negative.   Skin: Negative.   Neurological: Positive for dizziness and light-headedness. Negative for tremors, seizures, syncope, facial asymmetry, speech difficulty, weakness and headaches.  Psychiatric/Behavioral: Negative for confusion.    Allergies  Review of patient's allergies indicates no known allergies.  Home Medications   Prior to Admission medications   Medication Sig Start Date End Date Taking? Authorizing Provider  furosemide (LASIX) 40 MG tablet Take 1 tablet (40 mg total) by mouth daily. 08/19/13  Yes Dorothy Spark, MD  gabapentin (NEURONTIN) 100 MG capsule Take 1 capsule (100 mg total) by mouth 3 (three) times daily. 03/08/14  Yes Dorothy Spark, MD  hydrALAZINE (APRESOLINE) 25 MG tablet TAKE 3 TABLETS AT BREAKFAST, 3 TABLETS AT LUNCH, AND 3 TABLETS AT DINNER TO TOTAL 225 MG 03/08/14  Yes Dorothy Spark, MD  isosorbide mononitrate (IMDUR) 60 MG 24 hr tablet Take 1 tablet (60 mg total) by mouth daily. 03/08/14  Yes Dorothy Spark, MD  lisinopril (PRINIVIL,ZESTRIL) 40 MG tablet Take 1 tablet (40 mg total) by mouth daily. 03/08/14  Yes Dorothy Spark, MD  metFORMIN (GLUCOPHAGE) 500 MG tablet Take 1 tablet (500 mg total) by mouth 2 (two) times daily with a meal. 08/20/13  Yes Deepak Advani, MD  albuterol (PROVENTIL HFA;VENTOLIN HFA) 108 (90 BASE) MCG/ACT inhaler Inhale 2 puffs into the lungs daily.     Historical Provider, MD  albuterol (PROVENTIL) (2.5 MG/3ML) 0.083%  nebulizer solution Take 2.5 mg by nebulization every 6 (six) hours as needed for wheezing.    Historical Provider, MD  albuterol (PROVENTIL) (2.5 MG/3ML) 0.083% nebulizer solution 1 to 2 amps via nebulizer every 4 hours as needed 05/08/14   Harden Mo, MD  fluticasone (FLOVENT HFA) 110 MCG/ACT inhaler Inhale 1 puff into the lungs 2 (two) times daily as needed  (for wheezing).     Historical Provider, MD  fluticasone (FLOVENT HFA) 110 MCG/ACT inhaler 3 puffs twice daily 05/08/14   Harden Mo, MD  glucose monitoring kit (FREESTYLE) monitoring kit 1 each by Does not apply route as needed for other. Dispense any model that is covered- dispense testing supplies for Q AC/ HS accuchecks- 1 month supply with one refil. 08/20/13   Lorayne Marek, MD  HYDROcodone-acetaminophen (NORCO/VICODIN) 5-325 MG per tablet 1 to 2 tabs every 4 to 6 hours as needed for pain. 04/26/14   Harden Mo, MD  methocarbamol (ROBAXIN) 500 MG tablet Take 1 tablet (500 mg total) by mouth 3 (three) times daily. 04/26/14   Harden Mo, MD  nebivolol (BYSTOLIC) 10 MG tablet TAKE DAILY AT BEDTIME 09/09/13   Dorothy Spark, MD  nitroGLYCERIN (NITROSTAT) 0.4 MG SL tablet Place 1 tablet (0.4 mg total) under the tongue every 5 (five) minutes as needed for chest pain. 02/20/13   Dorothy Spark, MD  predniSONE (DELTASONE) 20 MG tablet Take 1 tablet (20 mg total) by mouth 2 (two) times daily. 05/08/14   Harden Mo, MD   BP 126/79 mmHg  Pulse 98  Temp(Src) 98.2 F (36.8 C) (Oral)  Resp 16  SpO2 98% Physical Exam  Constitutional: He is oriented to person, place, and time. He appears well-developed and well-nourished. No distress.  HENT:  Head: Normocephalic and atraumatic.  Eyes: Conjunctivae and EOM are normal. Pupils are equal, round, and reactive to light. No scleral icterus.  Neck: Normal range of motion. Neck supple.  Cardiovascular: Normal rate, regular rhythm and normal heart sounds.   Pulmonary/Chest: Effort normal and breath sounds normal. No respiratory distress. He has no wheezes. He has no rales.  Abdominal: Soft. Bowel sounds are normal. He exhibits no distension. There is no tenderness.  Musculoskeletal: Normal range of motion. He exhibits no edema or tenderness.  Neurological: He is alert and oriented to person, place, and time. He has normal strength. No cranial  nerve deficit or sensory deficit. Coordination and gait normal. GCS eye subscore is 4. GCS verbal subscore is 5. GCS motor subscore is 6.  Skin: Skin is warm and dry.  Psychiatric: He has a normal mood and affect. His behavior is normal.  Nursing note and vitals reviewed.   ED Course  ED EKG  Date/Time: 09/08/2014 3:14 PM Performed by: Griselda Miner LEE H Authorized by: Lutricia Feil Interpreted by ED physician Comparison: compared with previous ECG from 03/08/2014 Comparison to previous ECG: No acute changes Rhythm: sinus rhythm Rate: normal BPM: 69 QRS axis: normal Conduction: conduction normal ST Segments: ST segments normal T Waves: T waves normal Other: no other findings    (including critical care time) Labs Review Labs Reviewed  POCT I-STAT, CHEM 8 - Abnormal; Notable for the following:    Chloride 100 (*)    Glucose, Bld 108 (*)    All other components within normal limits  POCT URINALYSIS DIP (DEVICE)    Imaging Review No results found.   MDM   1. Other fatigue   UA  and Istat 8 unremarkable Vitals normal Exam unremarkable I advised patient that he should continue his evaluation and risk stratification today at [place] but he declined stating that he needed to pick his son up from school at 3:10pm. Therefore, I contacted his current cardiologist and arranged for him to be seen at [facility] tomorrow, 09/09/2014 by Dr. Meda Coffee at 12:00pm. Patient agrees with this plan of care. He also voices understanding that if symptoms become suddenly worse or severe, if he develops chest pain, palpitations, syncope, difficulty breathing or other symptoms of concern, he is to dial 911 and seek urgent re-evaluation at his nearest ER. He understands that if he is at all symptomatic or dizzy, he is NOT to drive. Reports himself to be symptom-free at time of discharge. Ambulatory without difficulty.      Lutricia Feil, [place] 09/08/14 [phone]

## 2014-09-08 NOTE — Discharge Instructions (Signed)
Your labs and EKG today were both normal, however, I am still quite concerned about your heart. As you are unable to seek further evaluation in the Emergency Room today, I have contacted your cardiologist and arranged for you to be seen by her tomorrow, 09/09/2014, @ 11:45am. If your symptoms become suddenly worse, severe or persistent over night, your are to seek immediate re-evaluation at your nearest emergency room. If you develop chest pain, difficulty breathing, rapid heart beat of feel as if you are going to faint, please dial 911 and seek re-evaluation at your nearest emergency room. YOU ARE NOT TO DRIVE IF YOU ARE AT ALL DIZZY.  Fatigue Fatigue is a feeling of tiredness, lack of energy, lack of motivation, or feeling tired all the time. Having enough rest, good nutrition, and reducing stress will normally reduce fatigue. Consult your caregiver if it persists. The nature of your fatigue will help your caregiver to find out its cause. The treatment is based on the cause.  CAUSES  There are many causes for fatigue. Most of the time, fatigue can be traced to one or more of your habits or routines. Most causes fit into one or more of three general areas. They are: Lifestyle problems  Sleep disturbances.  Overwork.  Physical exertion.  Unhealthy habits.  Poor eating habits or eating disorders.  Alcohol and/or drug use .  Lack of proper nutrition (malnutrition). Psychological problems  Stress and/or anxiety problems.  Depression.  Grief.  Boredom. Medical Problems or Conditions  Anemia.  Pregnancy.  Thyroid gland problems.  Recovery from major surgery.  Continuous pain.  Emphysema or asthma that is not well controlled  Allergic conditions.  Diabetes.  Infections (such as mononucleosis).  Obesity.  Sleep disorders, such as sleep apnea.  Heart failure or other heart-related problems.  Cancer.  Kidney disease.  Liver disease.  Effects of certain medicines  such as antihistamines, cough and cold remedies, prescription pain medicines, heart and blood pressure medicines, drugs used for treatment of cancer, and some antidepressants. SYMPTOMS  The symptoms of fatigue include:   Lack of energy.  Lack of drive (motivation).  Drowsiness.  Feeling of indifference to the surroundings. DIAGNOSIS  The details of how you feel help guide your caregiver in finding out what is causing the fatigue. You will be asked about your present and past health condition. It is important to review all medicines that you take, including prescription and non-prescription items. A thorough exam will be done. You will be questioned about your feelings, habits, and normal lifestyle. Your caregiver may suggest blood tests, urine tests, or other tests to look for common medical causes of fatigue.  TREATMENT  Fatigue is treated by correcting the underlying cause. For example, if you have continuous pain or depression, treating these causes will improve how you feel. Similarly, adjusting the dose of certain medicines will help in reducing fatigue.  HOME CARE INSTRUCTIONS   Try to get the required amount of good sleep every night.  Eat a healthy and nutritious diet, and drink enough water throughout the day.  Practice ways of relaxing (including yoga or meditation).  Exercise regularly.  Make plans to change situations that cause stress. Act on those plans so that stresses decrease over time. Keep your work and personal routine reasonable.  Avoid street drugs and minimize use of alcohol.  Start taking a daily multivitamin after consulting your caregiver. SEEK MEDICAL CARE IF:   You have persistent tiredness, which cannot be accounted for.  You  have fever.  You have unintentional weight loss.  You have headaches.  You have disturbed sleep throughout the night.  You are feeling sad.  You have constipation.  You have dry skin.  You have gained weight.  You  are taking any new or different medicines that you suspect are causing fatigue.  You are unable to sleep at night.  You develop any unusual swelling of your legs or other parts of your body. SEEK IMMEDIATE MEDICAL CARE IF:   You are feeling confused.  Your vision is blurred.  You feel faint or pass out.  You develop severe headache.  You develop severe abdominal, pelvic, or back pain.  You develop chest pain, shortness of breath, or an irregular or fast heartbeat.  You are unable to pass a normal amount of urine.  You develop abnormal bleeding such as bleeding from the rectum or you vomit blood.  You have thoughts about harming yourself or committing suicide.  You are worried that you might harm someone else. MAKE SURE YOU:   Understand these instructions.  Will watch your condition.  Will get help right away if you are not doing well or get worse. Document Released: 02/11/2007 Document Revised: 07/09/2011 Document Reviewed: 08/18/2013 [facility] Patient Information 2015 [place], [place]. This information is not intended to replace advice given to you by your health care provider. Make sure you discuss any questions you have with your health care provider.

## 2014-09-09 ENCOUNTER — Encounter: Payer: Self-pay | Admitting: Cardiology

## 2014-09-09 ENCOUNTER — Ambulatory Visit (INDEPENDENT_AMBULATORY_CARE_PROVIDER_SITE_OTHER): Payer: PRIVATE HEALTH INSURANCE | Admitting: Cardiology

## 2014-09-09 ENCOUNTER — Encounter: Payer: Self-pay | Admitting: *Deleted

## 2014-09-09 VITALS — BP 154/105 | HR 85 | Ht 75.0 in | Wt 238.4 lb

## 2014-09-09 DIAGNOSIS — G4730 Sleep apnea, unspecified: Secondary | ICD-10-CM

## 2014-09-09 DIAGNOSIS — I5023 Acute on chronic systolic (congestive) heart failure: Secondary | ICD-10-CM

## 2014-09-09 DIAGNOSIS — I10 Essential (primary) hypertension: Secondary | ICD-10-CM | POA: Diagnosis not present

## 2014-09-09 DIAGNOSIS — E1140 Type 2 diabetes mellitus with diabetic neuropathy, unspecified: Secondary | ICD-10-CM | POA: Diagnosis not present

## 2014-09-09 MED ORDER — FUROSEMIDE 40 MG PO TABS: 40.0000 mg | ORAL_TABLET | Freq: Two times a day (BID) | ORAL | Status: DC

## 2014-09-09 MED ORDER — GABAPENTIN 300 MG PO CAPS: 300.0000 mg | ORAL_CAPSULE | Freq: Three times a day (TID) | ORAL | Status: DC

## 2014-09-09 NOTE — Patient Instructions (Signed)
Medication Instructions:   INCREASE YOUR LASIX TO TAKING 40 MG TWICE DAILY---TAKE 40 MG AT 8 AM AND 40 MG AT 2 PM  INCREASE YOUR GABAPENTIN TO 300 MG THREE TIMES DAILY    Testing/Procedures:  Your physician has requested that you have an echocardiogram. Echocardiography is a painless test that uses sound waves to create images of your heart. It provides your doctor with information about the size and shape of your heart and how well your heart's chambers and valves are working. This procedure takes approximately one hour. There are no restrictions for this procedure.   Your physician has recommended that you have a sleep study. This test records several body functions during sleep, including: brain activity, eye movement, oxygen and carbon dioxide blood levels, heart rate and rhythm, breathing rate and rhythm, the flow of air through your mouth and nose, snoring, body muscle movements, and chest and belly movement.    Follow-Up:  3 WEEKS WITH DR Delton See.  OK TO BOOK THIS ON Sep 28, 2014 AT 2:45 PM PER DR Delton See

## 2014-09-09 NOTE — Progress Notes (Signed)
Patient ID: Joseph Shannon, male   DOB: Feb 27, 1981, 34 y.o.   MRN: [id number]    Patient Name: Joseph Shannon Date of Encounter: 09/09/2014  Primary Care Provider:  No PCP Per Patient Primary Cardiologist:  Lars Masson  Patient Profile  Post-hospitalization visit  Problem List   Past Medical History  Diagnosis Date  . Hypertension   . Asthma    No past surgical history on file.  Allergies  No Known Allergies  HPI  34 year old gentleman with history of hypertension and asthma who was seen in the emergency department for complaints of cough and chest pain and left sided weakness.   In May 2014 the patient presented with intermittent h/a for 4 days with elevated BP, but progressed to central chest tight and episode of coughing, BP elevated at 172/113. Cardiopulm exam benign. Neuro exam benign. EKG unchanged from prior. Pt admitted 5/13 for TIA/HTN, found to have possible apical hypokinesis, and benign appearing pineal cyst. He was started on lopressor & ACE inhibitor, asked to f/u with PCP. He has not yet been able to establish with one and has not been able to have stress testing as directed. CXR unremarkable, trop neg x2. Pt's h/a, CP, and BP improved after 1 SL NTG.   05/18/2013 The patient is coming here today and he states that he has just been almost daily, retrosternal pressure-like nonexertional but sometimes they feel like they're relieved by rest, he hasn't tried to use nitroglycerin with it.  The patient is very concerned as his mom with an early 9s just had a major myocardial infarction. His cousin just had a left ventricular assist device placed heart failure, the patient is not aware of etiology. The patient is asymptomatic. He brings a BP diary, his BP is consistently elevated with values in 150-190 mmHg range.  Imdur 30 mg po daily and hydralazine 25 mg po TID was prescribed.  08/19/2013 - complains of DOE, LE edema, hands and feet tingling. He is also  complaining about multiple small and large joint pain, he states that different arthritis types run in his family.  09/09/2013 - the patient comes today also mildly stating that his symptoms are all resolved. He is compliant with all his meds and he is able to afford them other than Bystolic. He has been approved for hardship and he'll be getting a one-year supply for by systolic [facility]. He denies any chest pain, shortness of breath, palpitation, syncope or lower extremity edema.  03/08/2014 - the patient is coming for 6 months follow-up. He states that his blood pressure has been elevated but can. He arm again developed some mild shortness of breath with exertion. 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 He complaint to his meds. He tolerates metformin well. He complains of significant burst dizziness in both of his legs.  09/09/2014 - the patient is coming after 6 months, he has been experiencing fatigue and DOE. He has on and off PND and mild LE edema. He complains of insomnia, he snores. He has significant pain and tingling in his LE. NO palpitations or syncopes. BP controlled at home.   Home Medications  Prior to Admission medications   Medication Sig Start Date End Date Taking? Authorizing Provider  albuterol (PROVENTIL HFA;VENTOLIN HFA) 108 (90 BASE) MCG/ACT inhaler Inhale 2 puffs into the lungs daily.    Yes Historical Provider, MD  albuterol (PROVENTIL) (2.5 MG/3ML) 0.083% nebulizer solution Take 2.5 mg by nebulization  every 6 (six) hours as needed for wheezing.   Yes Historical Provider, MD  fluticasone (FLOVENT HFA) 110 MCG/ACT inhaler Inhale 1 puff into the lungs 2 (two) times daily as needed (for wheezing).    Yes Historical Provider, MD  lisinopril-hydrochlorothiazide (PRINZIDE,ZESTORETIC) 20-25 MG per tablet Take 2 tablets by mouth daily. 02/04/13  Yes Alison Murray, MD  metoprolol tartrate (LOPRESSOR) 25 MG tablet Take 1 tablet (25 mg total) by  mouth 2 (two) times daily. 02/04/13  Yes Alison Murray, MD    Family History  Family History  Problem Relation Age of Onset  . Hypertension Father   . Heart disease Father   . Hypertension Mother   . Heart disease Mother   . Heart disease Paternal Uncle   . Heart disease Maternal Grandmother     Social History  History   Social History  . Marital Status: Single    Spouse Name: N/A  . Number of Children: N/A  . Years of Education: N/A   Occupational History  . Not on file.   Social History Main Topics  . Smoking status: Former Smoker -- 0.50 packs/day    Types: Cigarettes  . Smokeless tobacco: Not on file  . Alcohol Use: No     Comment: Socially Only  . Drug Use: No  . Sexual Activity: Not on file   Other Topics Concern  . Not on file   Social History Narrative     Review of Systems, as per history of present illness General:  No chills, fever, night sweats or weight changes.  Cardiovascular:  No chest pain, dyspnea on exertion, edema, orthopnea, palpitations, paroxysmal nocturnal dyspnea. Dermatological: No rash, lesions/masses Respiratory: No cough, dyspnea Urologic: No hematuria, dysuria Abdominal:   No nausea, vomiting, diarrhea, bright red blood per rectum, melena, or hematemesis Neurologic:  No visual changes, wkns, changes in mental status. All other systems reviewed and are otherwise negative except as noted above.  Physical Exam  General: Pleasant, NAD Psych: Normal affect. Neuro: Alert and oriented X 3. Moves all extremities spontaneously. HEENT: Normal  Neck: Supple without bruits or JVD. Lungs:  Resp regular and unlabored, CTA. Heart: RRR no s3, s4, or murmurs. Abdomen: Soft, non-tender, non-distended, BS + x 4.  Extremities: No clubbing, cyanosis, trace LE edema. DP/PT/Radials 2+ and equal bilaterally.  Accessory Clinical Findings  ECG - sinus rhythm 88 beats per minute, normal EKG  Lipid Panel     Component Value Date/Time   CHOL 185  03/08/2014 1046   TRIG 119.0 03/08/2014 1046   HDL 43.70 03/08/2014 1046   CHOLHDL 4 03/08/2014 1046   VLDL 23.8 03/08/2014 1046   LDLCALC 118* 03/08/2014 1046   Duplex carotids; 09/19/12 Summary:  - No significant extracranial carotid artery stenosis demonstrated. Vertebrals are patent with antegrade flow. -  TTE 09/19/12 Study Conclusions  - Left ventricle: The cavity size was normal. Wall thickness was increased in a pattern of mild LVH. Systolic function was normal. The estimated ejection fraction was in the range of 50% to 55%. Possible hypokinesis of the apical myocardium. Left ventricular diastolic function parameters were normal. - Left atrium: The atrium was mildly dilated. Impressions:  - Possible small area of apical hypokinesis; suggest fu study with contrast to further evaluate if clinically indicated.   Exercise nuclear stress test: 02/26/2013 Quantitative Gated Spect Images  QGS EDV: 165 ml  QGS ESV: 90 ml  Impression  Exercise Capacity: Good exercise capacity.  BP Response: Hypertensive blood pressure response.  Clinical Symptoms: No chest pain.  ECG Impression: No significant ST segment change suggestive of ischemia.  Comparison with Prior Nuclear Study: No images to compare  Overall Impression: Low risk stress nuclear study. No evidence of ischemia. Good exercise tolerance. LV systolic function is mildly depressed.  LV Ejection Fraction: 45%. LV Wall Motion: Normal Wall Motion. No segmental wall motion abnormalities. EF mildly depressed.  Cassell Clement   Assessment & Plan  34 year old gentleman with a history of hypertension, possible TIA, pineal cyst, with significant family history of CAD with coming for concerns of chest pain.  1. Acute on chronic systolic CHF - with worsening DOE, we wil increase Lasix to 40 mg po BID and re-evaluate in 3 weeks. Repeat TTE.  2. OSA - refer to sleep study.   3. Hypertension - we will increase Lasix for now,  re-evaluate at the next visit  Lasix 40 mg po BID Bystolic 10 mg po daily Increase Imdur from 30 mg to 60 mg po daily Increase Hydralazine from 50 mg to 75 mg po TID Lisinopril 40 mg po daily  4. Palpitations, history of TIA, mildly dilated left atrium - the patient couldn't efford eCardio monitor, he was enrolled for [employer] for [place]. 24H Holter was completely normal, no a-fib. Palpitations now resolved  5. Diabetic neuropathy -  HbA1c 6.4%, started on metformin 500 mg twice a day. We will increase gabapentin 300 mg po TID.  6. DM - new dg - we started him on Metformin.   Followup in 3 months.    Lars Masson, MD 09/09/2014, 12:20 PM

## 2014-09-10 ENCOUNTER — Other Ambulatory Visit (HOSPITAL_COMMUNITY): Payer: PRIVATE HEALTH INSURANCE

## 2014-09-14 ENCOUNTER — Ambulatory Visit (HOSPITAL_COMMUNITY): Payer: PRIVATE HEALTH INSURANCE | Attending: Cardiovascular Disease

## 2014-09-14 ENCOUNTER — Other Ambulatory Visit: Payer: Self-pay

## 2014-09-14 DIAGNOSIS — I5023 Acute on chronic systolic (congestive) heart failure: Secondary | ICD-10-CM | POA: Diagnosis not present

## 2014-09-14 DIAGNOSIS — I509 Heart failure, unspecified: Secondary | ICD-10-CM | POA: Insufficient documentation

## 2014-09-14 DIAGNOSIS — E119 Type 2 diabetes mellitus without complications: Secondary | ICD-10-CM | POA: Insufficient documentation

## 2014-09-14 DIAGNOSIS — G4730 Sleep apnea, unspecified: Secondary | ICD-10-CM | POA: Diagnosis not present

## 2014-09-14 DIAGNOSIS — I10 Essential (primary) hypertension: Secondary | ICD-10-CM | POA: Diagnosis not present

## 2014-09-23 ENCOUNTER — Encounter: Payer: Self-pay | Admitting: *Deleted

## 2014-09-28 ENCOUNTER — Ambulatory Visit: Payer: PRIVATE HEALTH INSURANCE | Admitting: Cardiology

## 2014-11-20 ENCOUNTER — Encounter (HOSPITAL_COMMUNITY): Payer: Self-pay | Admitting: Emergency Medicine

## 2014-11-20 ENCOUNTER — Emergency Department (INDEPENDENT_AMBULATORY_CARE_PROVIDER_SITE_OTHER)
Admission: EM | Admit: 2014-11-20 | Discharge: 2014-11-20 | Disposition: A | Discharge status: Home / Self Care | Payer: PRIVATE HEALTH INSURANCE | Source: Home / Self Care

## 2014-11-20 DIAGNOSIS — I10 Essential (primary) hypertension: Secondary | ICD-10-CM | POA: Diagnosis not present

## 2014-11-20 DIAGNOSIS — R109 Unspecified abdominal pain: Secondary | ICD-10-CM

## 2014-11-20 DIAGNOSIS — M25511 Pain in right shoulder: Secondary | ICD-10-CM

## 2014-11-20 LAB — POCT URINALYSIS DIP (DEVICE)
Bilirubin Urine: NEGATIVE
Glucose, UA: 100 mg/dL — AB
HGB URINE DIPSTICK: NEGATIVE
LEUKOCYTES UA: NEGATIVE
NITRITE: NEGATIVE
PH: 6.5 (ref 5.0–8.0)
Protein, ur: NEGATIVE mg/dL
Specific Gravity, Urine: 1.025 (ref 1.005–1.030)
UROBILINOGEN UA: 0.2 mg/dL (ref 0.0–1.0)

## 2014-11-20 LAB — URINALYSIS, ROUTINE W REFLEX MICROSCOPIC
BILIRUBIN URINE: NEGATIVE
Glucose, UA: 100 mg/dL — AB
HGB URINE DIPSTICK: NEGATIVE
Ketones, ur: 15 mg/dL — AB
Leukocytes, UA: NEGATIVE
NITRITE: NEGATIVE
Protein, ur: NEGATIVE mg/dL
SPECIFIC GRAVITY, URINE: 1.034 — AB (ref 1.005–1.030)
Urobilinogen, UA: 0.2 mg/dL (ref 0.0–1.0)
pH: 6 (ref 5.0–8.0)

## 2014-11-20 MED ORDER — DICLOFENAC SODIUM 75 MG PO TBEC: 75.0000 mg | DELAYED_RELEASE_TABLET | Freq: Two times a day (BID) | ORAL | Status: DC

## 2014-11-20 NOTE — ED Notes (Signed)
C/o right side abd pain for three days  No tx tried Area is tender to touch

## 2014-11-20 NOTE — ED Provider Notes (Addendum)
CSN: [id number]     Arrival date & time 11/20/14  1302 History   None    Chief Complaint  Patient presents with  . Abdominal Pain   (Consider location/radiation/quality/duration/timing/severity/associated sxs/prior Treatment) HPI  Right flank pain. Started 2 days ago. Constant dull ache with intermittent sharp episodes. Getting worse. Nothing makes the pain better. Occasionally worsened by movement. Food does not make it worse. Associated w/ R shoulder pain. Works Retail buyer. Has not tried anything for the pain. Denies any dysuria, hematuria, constipation, fevers, nausea, vomiting, headache, chest pain, patient's. Patient does say that when pain becomes very severe to take his breath away for a few minutes.  Did not take his meds this am due to pain  Past Medical History  Diagnosis Date  . Hypertension   . Asthma    History reviewed. No pertinent past surgical history. Family History  Problem Relation Age of Onset  . Hypertension Father   . Heart disease Father   . Hypertension Mother   . Heart disease Mother   . Heart disease Paternal Uncle   . Heart disease Maternal Grandmother    History  Substance Use Topics  . Smoking status: Former Smoker -- 0.50 packs/day    Types: Cigarettes  . Smokeless tobacco: Not on file  . Alcohol Use: No     Comment: Socially Only    Review of Systems Per HPI with all other pertinent systems negative.    Allergies  Review of patient's allergies indicates no known allergies.  Home Medications   Prior to Admission medications   Medication Sig Start Date End Date Taking? Authorizing Provider  albuterol (PROVENTIL HFA;VENTOLIN HFA) 108 (90 BASE) MCG/ACT inhaler Inhale 2 puffs into the lungs daily.     Historical Provider, MD  albuterol (PROVENTIL) (2.5 MG/3ML) 0.083% nebulizer solution Take 2.5 mg by nebulization every 6 (six) hours as needed for wheezing.    Historical Provider, MD  albuterol (PROVENTIL) (2.5 MG/3ML) 0.083% nebulizer  solution 1 to 2 amps via nebulizer every 4 hours as needed 05/08/14   Harden Mo, MD  fluticasone (FLOVENT HFA) 110 MCG/ACT inhaler Inhale 1 puff into the lungs 2 (two) times daily as needed (for wheezing).     Historical Provider, MD  fluticasone (FLOVENT HFA) 110 MCG/ACT inhaler 3 puffs twice daily 05/08/14   Harden Mo, MD  furosemide (LASIX) 40 MG tablet Take 1 tablet (40 mg total) by mouth 2 (two) times daily. Take 40 mg at 8 am and 40 mg at 2 pm. 09/09/14   Dorothy Spark, MD  gabapentin (NEURONTIN) 300 MG capsule Take 1 capsule (300 mg total) by mouth 3 (three) times daily. 09/09/14   Dorothy Spark, MD  glucose monitoring kit (FREESTYLE) monitoring kit 1 each by Does not apply route as needed for other. Dispense any model that is covered- dispense testing supplies for Q AC/ HS accuchecks- 1 month supply with one refil. 08/20/13   Lorayne Marek, MD  hydrALAZINE (APRESOLINE) 25 MG tablet TAKE 3 TABLETS AT BREAKFAST, 3 TABLETS AT LUNCH, AND 3 TABLETS AT DINNER TO TOTAL 225 MG 03/08/14   Dorothy Spark, MD  HYDROcodone-acetaminophen (NORCO/VICODIN) 5-325 MG per tablet 1 to 2 tabs every 4 to 6 hours as needed for pain. 04/26/14   Harden Mo, MD  isosorbide mononitrate (IMDUR) 60 MG 24 hr tablet Take 1 tablet (60 mg total) by mouth daily. 03/08/14   Dorothy Spark, MD  lisinopril (PRINIVIL,ZESTRIL) 40 MG tablet Take  1 tablet (40 mg total) by mouth daily. 03/08/14   Dorothy Spark, MD  metFORMIN (GLUCOPHAGE) 500 MG tablet Take 1 tablet (500 mg total) by mouth 2 (two) times daily with a meal. 08/20/13   Lorayne Marek, MD  methocarbamol (ROBAXIN) 500 MG tablet Take 1 tablet (500 mg total) by mouth 3 (three) times daily. 04/26/14   Harden Mo, MD  nebivolol (BYSTOLIC) 10 MG tablet TAKE DAILY AT BEDTIME 09/09/13   Dorothy Spark, MD  nitroGLYCERIN (NITROSTAT) 0.4 MG SL tablet Place 1 tablet (0.4 mg total) under the tongue every 5 (five) minutes as needed for chest pain. 02/20/13    Dorothy Spark, MD  predniSONE (DELTASONE) 20 MG tablet Take 1 tablet (20 mg total) by mouth 2 (two) times daily. 05/08/14   Harden Mo, MD   BP 153/103 mmHg  Pulse 93  Temp(Src) 97.7 F (36.5 C) (Oral)  Resp 16  SpO2 100% Physical Exam Physical Exam  Constitutional: oriented to person, place, and time. appears well-developed and well-nourished. No distress.  HENT:  Head: Normocephalic and atraumatic.  Eyes: EOMI. PERRL.  Neck: Normal range of motion.  Cardiovascular: RRR, no m/r/g, 2+ distal pulses,  Pulmonary/Chest: Effort normal and breath sounds normal. No respiratory distress.  Abdominal: Soft. Bowel sounds are normal. NonTTP, no distension.  Musculoskeletal: Intermittent right upper quadrant tender to palpation, negative Murphy sign, nontender at McBurney's point. Right shoulder with full range of motion, nontender to palpation, no effusions..  Neurological: alert and oriented to person, place, and time.  Skin: Skin is warm. No rash noted. non diaphoretic.  Psychiatric: normal mood and affect. behavior is normal. Judgment and thought content normal.   ED Course  Procedures (including critical care time) Labs Review Labs Reviewed  POCT URINALYSIS DIP (DEVICE) - Abnormal; Notable for the following:    Glucose, UA 100 (*)    Ketones, ur TRACE (*)    All other components within normal limits  URINALYSIS, ROUTINE W REFLEX MICROSCOPIC (NOT AT [facility])    Imaging Review No results found.   MDM   1. Flank pain   2. Essential hypertension    Etiology not immediately clear. Unlikely to be nephrolithiasis given absence of lead and urine though this is still a possibility and will send urinalysis off for microscopy. Patient urinated prior to his visit and has to drink a lot of water in order to give [place] a urine sample which was minimal and very clear at the time. Other differentials include muscle skeletal pain or cholecystitis though mild. They should start on all tearing,  heat, massage to the affected area as well as the shoulder. If pains continue to get worse and are associated with meals in the future patient will go to the emergency room for further imaging and workup. Voltaren for pain.  Elevation in blood pressure noted. Patient pain. And did not take his medications morning.  Current regimen.  Waldemar Dickens, MD 11/20/14 1411  Waldemar Dickens, MD 11/20/14 [phone]

## 2014-11-20 NOTE — Discharge Instructions (Signed)
Because her symptoms not immediately clear. There is unlikely that you have a kidney stone though still a possibility. May also have gallbladder disease causing a condition called cholecystitis. Her pain continues to get worse and is ever associated with meals he will need go the emergency room for further workup. Your pain may be just related to muscle strain and sprain and this will be best treated with the Voltaren as well as heat massage and rest.

## 2015-07-28 DIAGNOSIS — M25572 Pain in left ankle and joints of left foot: Secondary | ICD-10-CM | POA: Insufficient documentation

## 2015-08-08 ENCOUNTER — Other Ambulatory Visit: Payer: Self-pay | Admitting: *Deleted

## 2015-08-08 ENCOUNTER — Telehealth: Payer: Self-pay | Admitting: Cardiology

## 2015-08-08 DIAGNOSIS — I5023 Acute on chronic systolic (congestive) heart failure: Secondary | ICD-10-CM

## 2015-08-08 DIAGNOSIS — G4730 Sleep apnea, unspecified: Secondary | ICD-10-CM

## 2015-08-08 MED ORDER — NEBIVOLOL HCL 10 MG PO TABS: ORAL_TABLET | ORAL | Status: DC

## 2015-08-08 MED ORDER — FUROSEMIDE 40 MG PO TABS: 40.0000 mg | ORAL_TABLET | Freq: Two times a day (BID) | ORAL | Status: DC

## 2015-08-08 MED ORDER — GABAPENTIN 300 MG PO CAPS: 300.0000 mg | ORAL_CAPSULE | Freq: Three times a day (TID) | ORAL | Status: DC

## 2015-08-08 MED ORDER — LISINOPRIL 40 MG PO TABS: 40.0000 mg | ORAL_TABLET | Freq: Every day | ORAL | Status: DC

## 2015-08-08 NOTE — Telephone Encounter (Signed)
°*  STAT* If patient is at the pharmacy, call can be transferred to refill team.   1. Which medications need to be refilled? (please list name of each medication and dose if known) Bystolic,Gabapentin,Lisinopril,Furosemide  2. Which pharmacy/location (including street and city if local pharmacy) is medication to be sent to? [facility] at [place]  3. Do they need a 30 day or 90 day supply? 30

## 2015-08-31 ENCOUNTER — Encounter: Payer: Self-pay | Admitting: *Deleted

## 2015-08-31 ENCOUNTER — Ambulatory Visit (INDEPENDENT_AMBULATORY_CARE_PROVIDER_SITE_OTHER): Payer: BLUE CROSS/BLUE SHIELD | Admitting: Cardiology

## 2015-08-31 ENCOUNTER — Encounter: Payer: Self-pay | Admitting: Cardiology

## 2015-08-31 VITALS — BP 158/102 | HR 66 | Ht 75.0 in | Wt 240.0 lb

## 2015-08-31 DIAGNOSIS — I5023 Acute on chronic systolic (congestive) heart failure: Secondary | ICD-10-CM | POA: Diagnosis not present

## 2015-08-31 DIAGNOSIS — R0609 Other forms of dyspnea: Secondary | ICD-10-CM

## 2015-08-31 DIAGNOSIS — R61 Generalized hyperhidrosis: Secondary | ICD-10-CM

## 2015-08-31 DIAGNOSIS — R079 Chest pain, unspecified: Secondary | ICD-10-CM

## 2015-08-31 DIAGNOSIS — I110 Hypertensive heart disease with heart failure: Secondary | ICD-10-CM

## 2015-08-31 DIAGNOSIS — I10 Essential (primary) hypertension: Secondary | ICD-10-CM

## 2015-08-31 DIAGNOSIS — G4730 Sleep apnea, unspecified: Secondary | ICD-10-CM

## 2015-08-31 DIAGNOSIS — IMO0001 Reserved for inherently not codable concepts without codable children: Secondary | ICD-10-CM | POA: Insufficient documentation

## 2015-08-31 DIAGNOSIS — I119 Hypertensive heart disease without heart failure: Secondary | ICD-10-CM | POA: Insufficient documentation

## 2015-08-31 LAB — COMPREHENSIVE METABOLIC PANEL
ALT: 17 U/L (ref 9–46)
AST: 16 U/L (ref 10–40)
Albumin: 4.2 g/dL (ref 3.6–5.1)
Alkaline Phosphatase: 66 U/L (ref 40–115)
BUN: 15 mg/dL (ref 7–25)
CO2: 22 mmol/L (ref 20–31)
Calcium: 9.2 mg/dL (ref 8.6–10.3)
Chloride: 106 mmol/L (ref 98–110)
Creat: 0.96 mg/dL (ref 0.60–1.35)
Glucose, Bld: 108 mg/dL — ABNORMAL HIGH (ref 65–99)
Potassium: 4.4 mmol/L (ref 3.5–5.3)
Sodium: 140 mmol/L (ref 135–146)
Total Bilirubin: 0.6 mg/dL (ref 0.2–1.2)
Total Protein: 7.3 g/dL (ref 6.1–8.1)

## 2015-08-31 LAB — CBC WITH DIFFERENTIAL/PLATELET
Basophils Absolute: 53 cells/uL (ref 0–200)
Basophils Relative: 1 %
Eosinophils Absolute: 159 cells/uL (ref 15–500)
Eosinophils Relative: 3 %
HCT: 44.2 % (ref 38.5–50.0)
Hemoglobin: 15.1 g/dL (ref 13.2–17.1)
Lymphocytes Relative: 42 %
Lymphs Abs: 2226 cells/uL (ref 850–3900)
MCH: 31.2 pg (ref 27.0–33.0)
MCHC: 34.2 g/dL (ref 32.0–36.0)
MCV: 91.3 fL (ref 80.0–100.0)
MPV: 10.8 fL (ref 7.5–12.5)
Monocytes Absolute: 424 cells/uL (ref 200–950)
Monocytes Relative: 8 %
Neutro Abs: 2438 cells/uL (ref 1500–7800)
Neutrophils Relative %: 46 %
Platelets: 250 10*3/uL (ref 140–400)
RBC: 4.84 MIL/uL (ref 4.20–5.80)
RDW: 13 % (ref 11.0–15.0)
WBC: 5.3 10*3/uL (ref 3.8–10.8)

## 2015-08-31 LAB — TSH: TSH: 0.42 mIU/L (ref 0.40–4.50)

## 2015-08-31 LAB — LIPID PANEL
Cholesterol: 185 mg/dL (ref 125–200)
HDL: 54 mg/dL (ref >=40)
LDL Cholesterol: 113 mg/dL (ref <130)
Total CHOL/HDL Ratio: 3.4 Ratio (ref <=5.0)
Triglycerides: 89 mg/dL (ref <150)
VLDL: 18 mg/dL (ref <30)

## 2015-08-31 MED ORDER — NEBIVOLOL HCL 10 MG PO TABS: ORAL_TABLET | ORAL | Status: DC

## 2015-08-31 MED ORDER — LISINOPRIL 40 MG PO TABS: 40.0000 mg | ORAL_TABLET | Freq: Every day | ORAL | Status: DC

## 2015-08-31 MED ORDER — OMEPRAZOLE 20 MG PO CPDR: 20.0000 mg | DELAYED_RELEASE_CAPSULE | Freq: Every day | ORAL | Status: DC

## 2015-08-31 MED ORDER — HYDRALAZINE HCL 25 MG PO TABS: ORAL_TABLET | ORAL | Status: DC

## 2015-08-31 MED ORDER — FUROSEMIDE 40 MG PO TABS: 40.0000 mg | ORAL_TABLET | Freq: Two times a day (BID) | ORAL | Status: DC

## 2015-08-31 MED ORDER — ISOSORBIDE MONONITRATE ER 60 MG PO TB24: 60.0000 mg | ORAL_TABLET | Freq: Every day | ORAL | Status: DC

## 2015-08-31 NOTE — Patient Instructions (Signed)
Medication Instructions:   START TAKING PRILOSEC 20 MG ONCE DAILY ---PLEASE PURCHASE THIS OTC AT [facility]    Labwork:  TODAY--CMET, CBC W DIFF, TSH, AND LIPIDS    Follow-Up:  6 WEEKS WITH DR Delton See       If you need a refill on your cardiac medications before your next appointment, please call your pharmacy.

## 2015-08-31 NOTE — Progress Notes (Signed)
Patient ID: Joseph Shannon, male   DOB: 03/19/1981, 35 y.o.   MRN: [id number]    Patient Name: Joseph Shannon Date of Encounter: 08/31/2015  Primary Care Provider:  Eather Colas, FNP Primary Cardiologist:  Lars Masson  Patient Profile  Post-hospitalization visit  Problem List   Past Medical History  Diagnosis Date  . Hypertension   . Asthma    No past surgical history on file.  Allergies  No Known Allergies  HPI  35 year old gentleman with history of hypertension and asthma who was seen in the emergency department for complaints of cough and chest pain and left sided weakness.   In May 2014 the patient presented with intermittent h/a for 4 days with elevated BP, but progressed to central chest tight and episode of coughing, BP elevated at 172/113. Cardiopulm exam benign. Neuro exam benign. EKG unchanged from prior. Pt admitted 5/13 for TIA/HTN, found to have possible apical hypokinesis, and benign appearing pineal cyst. He was started on lopressor & ACE inhibitor, asked to f/u with PCP. He has not yet been able to establish with one and has not been able to have stress testing as directed. CXR unremarkable, trop neg x2. Pt's h/a, CP, and BP improved after 1 SL NTG.   05/18/2013 The patient is coming here today and he states that he has just been almost daily, retrosternal pressure-like nonexertional but sometimes they feel like they're relieved by rest, he hasn't tried to use nitroglycerin with it.  The patient is very concerned as his mom with an early 39s just had a major myocardial infarction. His cousin just had a left ventricular assist device placed heart failure, the patient is not aware of etiology. The patient is asymptomatic. He brings a BP diary, his BP is consistently elevated with values in 150-190 mmHg range.  Imdur 30 mg po daily and hydralazine 25 mg po TID was prescribed.  08/19/2013 - complains of DOE, LE edema, hands and feet tingling. He is also  complaining about multiple small and large joint pain, he states that different arthritis types run in his family.  09/09/2013 - the patient comes today also mildly stating that his symptoms are all resolved. He is compliant with all his meds and he is able to afford them other than Bystolic. He has been approved for hardship and he'll be getting a one-year supply for by systolic [facility]. He denies any chest pain, shortness of breath, palpitation, syncope or lower extremity edema.  03/08/2014 - the patient is coming for 6 months follow-up. He states that his blood pressure has been elevated but can. He arm again developed some mild shortness of breath with exertion. 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 He complaint to his meds. He tolerates metformin well. He complains of significant burst dizziness in both of his legs.  08/31/2015 - 1 year follow up, improved DOE and LE edema, no palpitations or syncope, now chest pain, burning at night, no on exertion. No orthopnea, PND, ran out of meds few weeks ago.   Home Medications  Prior to Admission medications   Medication Sig Start Date End Date Taking? Authorizing Provider  albuterol (PROVENTIL HFA;VENTOLIN HFA) 108 (90 BASE) MCG/ACT inhaler Inhale 2 puffs into the lungs daily.    Yes Historical Provider, MD  albuterol (PROVENTIL) (2.5 MG/3ML) 0.083% nebulizer solution Take 2.5 mg by nebulization every 6 (six) hours as needed for wheezing.   Yes Historical Provider, MD  fluticasone (FLOVENT HFA) 110 MCG/ACT inhaler Inhale 1 puff into the lungs 2 (two) times daily as needed (for wheezing).    Yes Historical Provider, MD  lisinopril-hydrochlorothiazide (PRINZIDE,ZESTORETIC) 20-25 MG per tablet Take 2 tablets by mouth daily. 02/04/13  Yes Alison Murray, MD  metoprolol tartrate (LOPRESSOR) 25 MG tablet Take 1 tablet (25 mg total) by mouth 2 (two) times daily. 02/04/13  Yes Alison Murray, MD    Family History  Family  History  Problem Relation Age of Onset  . Hypertension Father   . Heart disease Father   . Hypertension Mother   . Heart disease Mother   . Heart disease Paternal Uncle   . Heart disease Maternal Grandmother     Social History  Social History   Social History  . Marital Status: Single    Spouse Name: N/A  . Number of Children: N/A  . Years of Education: N/A   Occupational History  . Not on file.   Social History Main Topics  . Smoking status: Former Smoker -- 0.50 packs/day    Types: Cigarettes  . Smokeless tobacco: Not on file  . Alcohol Use: No     Comment: Socially Only  . Drug Use: No  . Sexual Activity: Not on file   Other Topics Concern  . Not on file   Social History Narrative     Review of Systems, as per history of present illness General:  No chills, fever, night sweats or weight changes.  Cardiovascular:  No chest pain, dyspnea on exertion, edema, orthopnea, palpitations, paroxysmal nocturnal dyspnea. Dermatological: No rash, lesions/masses Respiratory: No cough, dyspnea Urologic: No hematuria, dysuria Abdominal:   No nausea, vomiting, diarrhea, bright red blood per rectum, melena, or hematemesis Neurologic:  No visual changes, wkns, changes in mental status. All other systems reviewed and are otherwise negative except as noted above.  Physical Exam  General: Pleasant, NAD Psych: Normal affect. Neuro: Alert and oriented X 3. Moves all extremities spontaneously. HEENT: Normal  Neck: Supple without bruits or JVD. Lungs:  Resp regular and unlabored, CTA. Heart: RRR no s3, s4, or murmurs. Abdomen: Soft, non-tender, non-distended, BS + x 4.  Extremities: No clubbing, cyanosis, trace LE edema. DP/PT/Radials 2+ and equal bilaterally.  Accessory Clinical Findings  ECG - sinus rhythm 88 beats per minute, normal EKG  Lipid Panel     Component Value Date/Time   CHOL 185 03/08/2014 1046   TRIG 119.0 03/08/2014 1046   HDL 43.70 03/08/2014 1046    CHOLHDL 4 03/08/2014 1046   VLDL 23.8 03/08/2014 1046   LDLCALC 118* 03/08/2014 1046   Duplex carotids; 09/19/12 Summary:  - No significant extracranial carotid artery stenosis demonstrated. Vertebrals are patent with antegrade flow. -  TTE 09/19/12  - Left ventricle: The cavity size was normal. Wall thickness was increased in a pattern of mild LVH. Systolic function was normal. The estimated ejection fraction was in the range of 50% to 55%. Possible hypokinesis of the apical myocardium. Left ventricular diastolic function parameters were normal. - Left atrium: The atrium was mildly dilated. Impressions:  - Possible small area of apical hypokinesis; suggest fu study with contrast to further evaluate if clinically indicated.  TTE: 5/71/2016 Left ventricle: The cavity size was normal. Wall thickness was increased in a pattern of mild LVH. Systolic function was normal. The estimated ejection fraction was in the range of 50% to 55%. Wall motion was normal; there were no regional wall motion abnormalities. Features are consistent with a  pseudonormal left ventricular filling pattern, with concomitant abnormal relaxation and increased filling pressure (grade 2 diastolic dysfunction).  ------------------------------------------------------------------- Aortic valve: Structurally normal valve. Cusp separation was normal. Doppler: Transvalvular velocity was within the normal range. There was no stenosis. There was no regurgitation.  ------------------------------------------------------------------- Aorta: Aortic root: The aortic root was normal in size. Ascending aorta: The ascending aorta was normal in size.  ------------------------------------------------------------------- Mitral valve: Structurally normal valve. Leaflet separation was normal. Doppler: Transvalvular velocity was within the normal range. There was no evidence for stenosis. There was  no regurgitation.  ------------------------------------------------------------------- Left atrium: The atrium was normal in size.  ------------------------------------------------------------------- Right ventricle: The cavity size was normal. Systolic function was normal.  ------------------------------------------------------------------- Pulmonic valve: The valve appears to be grossly normal. Doppler: There was no significant regurgitation.  ------------------------------------------------------------------- Tricuspid valve: Structurally normal valve. Leaflet separation was normal. Doppler: Transvalvular velocity was within the normal range. There was no regurgitation.  ------------------------------------------------------------------- Right atrium: The atrium was mildly dilated.  ------------------------------------------------------------------- Pericardium: There was no pericardial effusion.  Exercise nuclear stress test: 02/26/2013 Quantitative Gated Spect Images  QGS EDV: 165 ml  QGS ESV: 90 ml  Impression  Exercise Capacity: Good exercise capacity.  BP Response: Hypertensive blood pressure response.  Clinical Symptoms: No chest pain.  ECG Impression: No significant ST segment change suggestive of ischemia.  Comparison with Prior Nuclear Study: No images to compare  Overall Impression: Low risk stress nuclear study. No evidence of ischemia. Good exercise tolerance. LV systolic function is mildly depressed.  LV Ejection Fraction: 45%. LV Wall Motion: Normal Wall Motion. No segmental wall motion abnormalities. EF mildly depressed.  Thomas Brackbill  ECG: 09/01/2015 - SR, normal ECG, unchanged from prior   Assessment & Plan  35 year old gentleman with a history of hypertension, possible TIA, pineal cyst, with significant family history of CAD with coming for concerns of chest pain.  1. Acute on chronic systolic CHF - improved, continue lasix 40  mg po BID, repeat TTE showed normal LVEF 50-55%.  2. Chest pain - at night, seems like GERD, start Prilosec 20 mg po daily  3. Hypertension - continue the same meds as he ran out, refill  Lasix 40 mg po BID Bystolic 10 mg po daily Increase Imdur from 30 mg to 60 mg po daily Increase Hydralazine from 50 mg to 75 mg po TID Lisinopril 40 mg po daily  4. Palpitations, history of TIA, mildly dilated left atrium - the patient couldn't efford eCardio monitor, he was enrolled for [employer] for [place]. 24H Holter was completely normal, no a-fib. Palpitations now resolved  5. Diabetic neuropathy -  HbA1c 6.4%, started on metformin 500 mg twice a day. We will increase gabapentin 300 mg po TID.  6. DM - new dg - we started him on Metformin.   Followup in 6 weeks.    Lars Masson, MD 08/31/2015, 8:41 AM

## 2015-09-23 ENCOUNTER — Encounter (HOSPITAL_COMMUNITY): Payer: Self-pay | Admitting: Emergency Medicine

## 2015-09-23 ENCOUNTER — Emergency Department (HOSPITAL_COMMUNITY): Payer: BLUE CROSS/BLUE SHIELD

## 2015-09-23 ENCOUNTER — Emergency Department (HOSPITAL_COMMUNITY)
Admission: EM | Admit: 2015-09-23 | Discharge: 2015-09-23 | Disposition: A | Discharge status: Home / Self Care | Payer: BLUE CROSS/BLUE SHIELD | Attending: Emergency Medicine | Admitting: Emergency Medicine

## 2015-09-23 DIAGNOSIS — J45909 Unspecified asthma, uncomplicated: Secondary | ICD-10-CM | POA: Insufficient documentation

## 2015-09-23 DIAGNOSIS — Z7952 Long term (current) use of systemic steroids: Secondary | ICD-10-CM | POA: Insufficient documentation

## 2015-09-23 DIAGNOSIS — Z79899 Other long term (current) drug therapy: Secondary | ICD-10-CM | POA: Insufficient documentation

## 2015-09-23 DIAGNOSIS — Z791 Long term (current) use of non-steroidal anti-inflammatories (NSAID): Secondary | ICD-10-CM | POA: Insufficient documentation

## 2015-09-23 DIAGNOSIS — F17210 Nicotine dependence, cigarettes, uncomplicated: Secondary | ICD-10-CM | POA: Diagnosis not present

## 2015-09-23 DIAGNOSIS — Z7984 Long term (current) use of oral hypoglycemic drugs: Secondary | ICD-10-CM | POA: Insufficient documentation

## 2015-09-23 DIAGNOSIS — Z7951 Long term (current) use of inhaled steroids: Secondary | ICD-10-CM | POA: Insufficient documentation

## 2015-09-23 DIAGNOSIS — I10 Essential (primary) hypertension: Secondary | ICD-10-CM | POA: Diagnosis not present

## 2015-09-23 DIAGNOSIS — R1031 Right lower quadrant pain: Secondary | ICD-10-CM | POA: Insufficient documentation

## 2015-09-23 DIAGNOSIS — I509 Heart failure, unspecified: Secondary | ICD-10-CM | POA: Diagnosis not present

## 2015-09-23 DIAGNOSIS — K5730 Diverticulosis of large intestine without perforation or abscess without bleeding: Secondary | ICD-10-CM | POA: Diagnosis not present

## 2015-09-23 HISTORY — DX: Type 2 diabetes mellitus without complications: E11.9

## 2015-09-23 LAB — COMPREHENSIVE METABOLIC PANEL
ALBUMIN: 4.2 g/dL (ref 3.5–5.0)
ALK PHOS: 65 U/L (ref 38–126)
ALT: 21 U/L (ref 17–63)
AST: 17 U/L (ref 15–41)
Anion gap: 8 (ref 5–15)
BILIRUBIN TOTAL: 0.7 mg/dL (ref 0.3–1.2)
BUN: 15 mg/dL (ref 6–20)
CALCIUM: 9 mg/dL (ref 8.9–10.3)
CO2: 24 mmol/L (ref 22–32)
CREATININE: 0.91 mg/dL (ref 0.61–1.24)
Chloride: 107 mmol/L (ref 101–111)
GFR calc Af Amer: >60 mL/min (ref >60)
GFR calc non Af Amer: >60 mL/min (ref >60)
GLUCOSE: 100 mg/dL — AB (ref 65–99)
Potassium: 3.7 mmol/L (ref 3.5–5.1)
SODIUM: 139 mmol/L (ref 135–145)
TOTAL PROTEIN: 7.3 g/dL (ref 6.5–8.1)

## 2015-09-23 LAB — CBC
HCT: 41.7 % (ref 39.0–52.0)
Hemoglobin: 13.8 g/dL (ref 13.0–17.0)
MCH: 30.4 pg (ref 26.0–34.0)
MCHC: 33.1 g/dL (ref 30.0–36.0)
MCV: 91.9 fL (ref 78.0–100.0)
Platelets: 255 10*3/uL (ref 150–400)
RBC: 4.54 MIL/uL (ref 4.22–5.81)
RDW: 12.5 % (ref 11.5–15.5)
WBC: 5.7 10*3/uL (ref 4.0–10.5)

## 2015-09-23 LAB — LIPASE, BLOOD: Lipase: 32 U/L (ref 11–51)

## 2015-09-23 MED ORDER — IOPAMIDOL (ISOVUE-300) INJECTION 61%
INTRAVENOUS | Status: AC
  Administered 2015-09-23: 100 mL
  Filled 2015-09-23: qty 100

## 2015-09-23 NOTE — ED Provider Notes (Signed)
CSN: [id number]     Arrival date & time 09/23/15  1748 History  By signing my name below, I, Irene Pap, attest that this documentation has been prepared under the direction and in the presence of Kalise Fickett, PA-C. Electronically Signed: Irene Pap, ED Scribe. 09/23/2015. 7:34 PM.   Chief Complaint  Patient presents with  . Abdominal Pain   The history is provided by the patient. No language interpreter was used.  HPI Comments: Joseph Shannon is a 35 y.o. Male with a hx of HTN and CHF who presents to the Emergency Department complaining of gradually worsening, constant RLQ abdominal pain onset three days ago. He reports worsening pain with movement and relief with laying down. Pt was sent to the ED by his PCP to rule out appendicitis. Pt states that he was prepped for a CT scan but they did not perform it at [facility]. He has not taken anything for his pain. He denies hx of abdominal surgeries or similar symptoms. He denies fever, chills, nausea, vomiting, constipation, hematochezia, decreased appetite, dysuria, back pain, hematuria, or diarrhea.   Past Medical History  Diagnosis Date  . Hypertension   . Asthma   . CHF (congestive heart failure) ([place])    History reviewed. No pertinent past surgical history. Family History  Problem Relation Age of Onset  . Hypertension Father   . Heart disease Father   . Hypertension Mother   . Heart disease Mother   . Heart disease Paternal Uncle   . Heart disease Maternal Grandmother    Social History  Substance Use Topics  . Smoking status: Current Every Day Smoker -- 0.25 packs/day    Types: Cigarettes  . Smokeless tobacco: None  . Alcohol Use: Yes     Comment: 3 beers and a shot per day- last use yesterday    Review of Systems  Constitutional: Negative for fever, chills and appetite change.  Gastrointestinal: Positive for abdominal pain. Negative for nausea, vomiting, diarrhea, constipation and blood in stool.   Genitourinary: Negative for dysuria, hematuria and flank pain.  Musculoskeletal: Negative for back pain.  All other systems reviewed and are negative.  Allergies  Review of patient's allergies indicates no known allergies.  Home Medications   Prior to Admission medications   Medication Sig Start Date End Date Taking? Authorizing Provider  albuterol (PROVENTIL HFA;VENTOLIN HFA) 108 (90 BASE) MCG/ACT inhaler Inhale 2 puffs into the lungs daily.     Historical Provider, MD  albuterol (PROVENTIL) (2.5 MG/3ML) 0.083% nebulizer solution Take 2.5 mg by nebulization every 4 (four) hours as needed for wheezing or shortness of breath.    Historical Provider, MD  diclofenac (VOLTAREN) 75 MG EC tablet Take 1 tablet (75 mg total) by mouth 2 (two) times daily. 11/20/14   Waldemar Dickens, MD  fluticasone [facility] HFA) 110 MCG/ACT inhaler 3 puffs twice daily 05/08/14   Harden Mo, MD  furosemide (LASIX) 40 MG tablet Take 1 tablet (40 mg total) by mouth 2 (two) times daily. Take 40 mg at 8 am and 40 mg at 2 pm. 08/31/15   Dorothy Spark, MD  gabapentin (NEURONTIN) 300 MG capsule Take 1 capsule (300 mg total) by mouth 3 (three) times daily. 08/08/15   Dorothy Spark, MD  glucose monitoring kit (FREESTYLE) monitoring kit 1 each by Does not apply route as needed for other. Dispense any model that is covered- dispense testing supplies for Q AC/ HS accuchecks- 1 month supply with one refil. 08/20/13  Lorayne Marek, MD  hydrALAZINE (APRESOLINE) 25 MG tablet TAKE 3 TABLETS AT BREAKFAST, 3 TABLETS AT LUNCH, AND 3 TABLETS AT DINNER TO TOTAL 225 MG 08/31/15   Dorothy Spark, MD  HYDROcodone-acetaminophen (NORCO/VICODIN) 5-325 MG per tablet 1 to 2 tabs every 4 to 6 hours as needed for pain. 04/26/14   Harden Mo, MD  isosorbide mononitrate (IMDUR) 60 MG 24 hr tablet Take 1 tablet (60 mg total) by mouth daily. 08/31/15   Dorothy Spark, MD  lisinopril (PRINIVIL,ZESTRIL) 40 MG tablet Take 1 tablet (40 mg total)  by mouth daily. 08/31/15   Dorothy Spark, MD  metFORMIN (GLUCOPHAGE) 500 MG tablet Take 1 tablet (500 mg total) by mouth 2 (two) times daily with a meal. 08/20/13   Lorayne Marek, MD  methocarbamol (ROBAXIN) 500 MG tablet Take 1 tablet (500 mg total) by mouth 3 (three) times daily. 04/26/14   Harden Mo, MD  nebivolol (BYSTOLIC) 10 MG tablet TAKE DAILY AT BEDTIME 08/31/15   Dorothy Spark, MD  nitroGLYCERIN (NITROSTAT) 0.4 MG SL tablet Place 1 tablet (0.4 mg total) under the tongue every 5 (five) minutes as needed for chest pain. 02/20/13   Dorothy Spark, MD  omeprazole (PRILOSEC) 20 MG capsule Take 1 capsule (20 mg total) by mouth daily. 08/31/15   Dorothy Spark, MD  predniSONE (DELTASONE) 20 MG tablet Take 1 tablet (20 mg total) by mouth 2 (two) times daily. 05/08/14   Harden Mo, MD   BP 164/110 mmHg  Pulse 75  Temp(Src) 99.3 F (37.4 C) (Oral)  Resp 18  Ht 6' 2.5" (1.892 m)  Wt 240 lb (108.863 kg)  BMI 30.41 kg/m2  SpO2 98% Physical Exam  Constitutional: He is oriented to person, place, and time. He appears well-developed and well-nourished.  HENT:  Head: Normocephalic and atraumatic.  Eyes: EOM are normal.  Neck: Normal range of motion. Neck supple.  Cardiovascular: Normal rate and regular rhythm.   Pulmonary/Chest: Effort normal and breath sounds normal. No respiratory distress. He has no wheezes.  Abdominal: Soft. Bowel sounds are normal. He exhibits no distension. There is tenderness. There is no rebound and no guarding.  No CVA tenderness. No tenderness at McBurney's point. Patient is tender over right lower side, just above the iliac crest. Pain worsened with movement and twisting of the torso.  Musculoskeletal: Normal range of motion.  Neurological: He is alert and oriented to person, place, and time.  Skin: Skin is warm and dry.  Psychiatric: He has a normal mood and affect. His behavior is normal.  Nursing note and vitals reviewed.   ED Course  Procedures  (including critical care time) DIAGNOSTIC STUDIES: Oxygen Saturation is 98% on RA, normal by my interpretation.    COORDINATION OF CARE: 7:32 PM-Discussed treatment plan which includes labs with pt at bedside and pt agreed to plan.    Labs Review Labs Reviewed  COMPREHENSIVE METABOLIC PANEL - Abnormal; Notable for the following:    Glucose, Bld 100 (*)    All other components within normal limits  LIPASE, BLOOD  CBC  URINALYSIS, ROUTINE W REFLEX MICROSCOPIC (NOT AT [facility])    Imaging Review No results found. I have personally reviewed and evaluated these images and lab results as part of my medical decision-making.   EKG Interpretation None      MDM   Final diagnoses:  None   Patient with right-sided pain. No tenderness at McBurney's point. Patient apparently sent here from his  doctor to get a CT scan and was already prepped with oral contrast for the scan. My exam is most consistent with musculoskeletal pain however given request of primary care doctor, will do a CT abdomen and pelvis here. Patient is also adamant about doing the CT scan.  Patient unable to give [place] a urine sample. Lab work is normal. CT pending. Patient does not want anything for pain at this time.  Patient has normal white blood cell count. CT came back normal. He still has not given as a urine sample and does not want to do that at this time or wait for results. He does not have any urinary symptoms. He does not have a fever. He is symptoms are not consistent with a kidney stone. At this time he would like to go home. We'll discharge home with either Prevacid or Tylenol for pain, return precautions discussed if symptoms are worsening.  Filed Vitals:   09/23/15 1809 09/23/15 2032  BP: 164/110 141/98  Pulse: 75 78  Temp: 99.3 F (37.4 C)   Resp: 18 18    I personally performed the services described in this documentation, which was scribed in my presence. The recorded information has been reviewed and is  accurate.   Jeannett Senior, PA-C 09/23/15 2120  Harvel Quale, MD 09/25/15 1451

## 2015-09-23 NOTE — ED Notes (Signed)
Patient transported to CT 

## 2015-09-23 NOTE — ED Notes (Signed)
Pt here from PCP office with lower right abdominal pain x 2 days. Pt sts PCP wantedhim to have CT scan for r/o appendicitis. Pt denies n/v/d, fever.

## 2015-09-23 NOTE — Discharge Instructions (Signed)
Take ibuprofen or Tylenol for pain. Avoid any strenuous activity. Your CT scan was normal today. We were not able to test urine for any signs of infection or any other abnormalities. If start having urinary symptoms or develop fever, please follow-up with your doctor to have that rechecked. Return to emergency department if your symptoms are worsening or develop any new concerning symptoms  Abdominal Pain, Adult Many things can cause abdominal pain. Usually, abdominal pain is not caused by a disease and will improve without treatment. It can often be observed and treated at home. Your health care provider will do a physical exam and possibly order blood tests and X-rays to help determine the seriousness of your pain. However, in many cases, more time must pass before a clear cause of the pain can be found. Before that point, your health care provider may not know if you need more testing or further treatment. HOME CARE INSTRUCTIONS Monitor your abdominal pain for any changes. The following actions may help to alleviate any discomfort you are experiencing:  Only take over-the-counter or prescription medicines as directed by your health care provider.  Do not take laxatives unless directed to do so by your health care provider.  Try a clear liquid diet (broth, tea, or water) as directed by your health care provider. Slowly move to a bland diet as tolerated. SEEK MEDICAL CARE IF:  You have unexplained abdominal pain.  You have abdominal pain associated with nausea or diarrhea.  You have pain when you urinate or have a bowel movement.  You experience abdominal pain that wakes you in the night.  You have abdominal pain that is worsened or improved by eating food.  You have abdominal pain that is worsened with eating fatty foods.  You have a fever. SEEK IMMEDIATE MEDICAL CARE IF:  Your pain does not go away within 2 hours.  You keep throwing up (vomiting).  Your pain is felt only in  portions of the abdomen, such as the right side or the left lower portion of the abdomen.  You pass bloody or black tarry stools. MAKE SURE YOU:  Understand these instructions.  Will watch your condition.  Will get help right away if you are not doing well or get worse.   This information is not intended to replace advice given to you by your health care provider. Make sure you discuss any questions you have with your health care provider.   Document Released: 01/24/2005 Document Revised: 01/05/2015 Document Reviewed: 12/24/2012 Elsevier Interactive Patient Education [employer]

## 2015-10-10 DIAGNOSIS — S86911A Strain of unspecified muscle(s) and tendon(s) at lower leg level, right leg, initial encounter: Secondary | ICD-10-CM | POA: Diagnosis not present

## 2015-10-12 DIAGNOSIS — S86911D Strain of unspecified muscle(s) and tendon(s) at lower leg level, right leg, subsequent encounter: Secondary | ICD-10-CM | POA: Diagnosis not present

## 2015-10-18 ENCOUNTER — Ambulatory Visit: Payer: BLUE CROSS/BLUE SHIELD | Admitting: Cardiology

## 2015-10-20 ENCOUNTER — Encounter: Payer: Self-pay | Admitting: Cardiology

## 2015-12-15 ENCOUNTER — Other Ambulatory Visit: Payer: Self-pay | Admitting: *Deleted

## 2015-12-15 ENCOUNTER — Telehealth: Payer: Self-pay | Admitting: Cardiology

## 2015-12-15 DIAGNOSIS — R0609 Other forms of dyspnea: Secondary | ICD-10-CM

## 2015-12-15 DIAGNOSIS — G4730 Sleep apnea, unspecified: Secondary | ICD-10-CM

## 2015-12-15 DIAGNOSIS — I10 Essential (primary) hypertension: Secondary | ICD-10-CM

## 2015-12-15 DIAGNOSIS — R079 Chest pain, unspecified: Secondary | ICD-10-CM

## 2015-12-15 DIAGNOSIS — I5023 Acute on chronic systolic (congestive) heart failure: Secondary | ICD-10-CM

## 2015-12-15 DIAGNOSIS — I110 Hypertensive heart disease with heart failure: Secondary | ICD-10-CM

## 2015-12-15 DIAGNOSIS — IMO0001 Reserved for inherently not codable concepts without codable children: Secondary | ICD-10-CM

## 2015-12-15 MED ORDER — GABAPENTIN 300 MG PO CAPS: 300.0000 mg | ORAL_CAPSULE | Freq: Three times a day (TID) | ORAL | 8 refills | Status: DC

## 2015-12-15 NOTE — Addendum Note (Signed)
Addended by: Awilda Bill on: 12/15/2015 01:41 PM   Modules accepted: Orders

## 2015-12-15 NOTE — Telephone Encounter (Signed)
New message        *STAT* If patient is at the pharmacy, call can be transferred to refill team.   1. Which medications need to be refilled? (please list name of each medication and dose if known) lisinopril, metoprolol and gabapentin  2. Which pharmacy/location (including street and city if local pharmacy) is medication to be sent to?[email or web address] 3. Do they need a 30 day or 90 day supply? 90 day

## 2015-12-15 NOTE — Telephone Encounter (Signed)
Please refill accordingly as Dr Delton See has done in the past.  Thanks!

## 2015-12-15 NOTE — Telephone Encounter (Signed)
lisinopril (PRINIVIL,ZESTRIL) 40 MG tablet  Medication  Date: 08/31/2015 Department: [facility] [address] Office Ordering/Authorizing: Lars Masson, MD  Order Providers   Prescribing Provider Encounter Provider  Lars Masson, MD Lars Masson, MD  Medication Detail    Disp Refills Start End   lisinopril (PRINIVIL,ZESTRIL) 40 MG tablet 90 tablet 6 08/31/2015    Sig - Route: Take 1 tablet (40 mg total) by mouth daily. - Oral   E-Prescribing Status: Receipt confirmed by pharmacy (08/31/2015 9:02 AM EDT)   Associated Diagnoses   Chest pain, unspecified chest pain type - Primary     Essential hypertension     DOE (dyspnea on exertion)     Acute on chronic systolic CHF (congestive heart failure) ([facility])     Sleep apnea     Hypertensive heart disease with heart failure [facility])     Sweating     Pharmacy   [facility] [address] [place], [place] - [address]

## 2015-12-15 NOTE — Telephone Encounter (Signed)
Medication  metoprolol (LOPRESSOR) 50 MG tablet [5009]  metoprolol (LOPRESSOR) 50 MG tablet [[id number]] DISCONTINUED  Order Details  Dose: 50 mg Route: Oral Frequency: 2 times daily  Dispense Quantity:  -- Refills:  -- Fills remaining:  --        Sig: Take 50 mg by mouth 2 (two) times daily. Take one and one half tab for total of 75mg  twice a day       Discontinue Date:  09/09/2013 1457 Discontinue User:  Loa Socks, LPN Discontinue Reason:  Discontinued by provider  Written Date:  -- Expiration Date:  -- Ordering Date:  08/20/13   Start Date:  -- End Date:  09/09/13         Ordering Provider:  -- DEA #:  -- NPI:  --   Authorizing Provider:  Historical Provider, MD DEA #:  -- NPI:  [id number]   Ordering User:  Lestine Mount, LPN            Pharmacy Comments:  --

## 2015-12-20 DIAGNOSIS — K5792 Diverticulitis of intestine, part unspecified, without perforation or abscess without bleeding: Secondary | ICD-10-CM | POA: Insufficient documentation

## 2015-12-20 DIAGNOSIS — Z800 Family history of malignant neoplasm of digestive organs: Secondary | ICD-10-CM | POA: Diagnosis not present

## 2015-12-20 DIAGNOSIS — R1031 Right lower quadrant pain: Secondary | ICD-10-CM | POA: Diagnosis not present

## 2015-12-20 DIAGNOSIS — R1011 Right upper quadrant pain: Secondary | ICD-10-CM | POA: Diagnosis not present

## 2015-12-22 DIAGNOSIS — R1031 Right lower quadrant pain: Secondary | ICD-10-CM | POA: Diagnosis not present

## 2015-12-22 DIAGNOSIS — R1011 Right upper quadrant pain: Secondary | ICD-10-CM | POA: Diagnosis not present

## 2015-12-22 DIAGNOSIS — R1013 Epigastric pain: Secondary | ICD-10-CM | POA: Diagnosis not present

## 2015-12-22 DIAGNOSIS — Z800 Family history of malignant neoplasm of digestive organs: Secondary | ICD-10-CM | POA: Diagnosis not present

## 2015-12-28 DIAGNOSIS — Z800 Family history of malignant neoplasm of digestive organs: Secondary | ICD-10-CM | POA: Diagnosis not present

## 2015-12-28 DIAGNOSIS — Z0000 Encounter for general adult medical examination without abnormal findings: Secondary | ICD-10-CM | POA: Diagnosis not present

## 2015-12-28 DIAGNOSIS — K2950 Unspecified chronic gastritis without bleeding: Secondary | ICD-10-CM | POA: Diagnosis not present

## 2015-12-28 DIAGNOSIS — R1011 Right upper quadrant pain: Secondary | ICD-10-CM | POA: Diagnosis not present

## 2015-12-28 DIAGNOSIS — K269 Duodenal ulcer, unspecified as acute or chronic, without hemorrhage or perforation: Secondary | ICD-10-CM | POA: Diagnosis not present

## 2015-12-28 DIAGNOSIS — R1013 Epigastric pain: Secondary | ICD-10-CM | POA: Diagnosis not present

## 2015-12-28 DIAGNOSIS — R1031 Right lower quadrant pain: Secondary | ICD-10-CM | POA: Diagnosis not present

## 2016-01-06 DIAGNOSIS — N281 Cyst of kidney, acquired: Secondary | ICD-10-CM | POA: Diagnosis not present

## 2016-01-06 DIAGNOSIS — R109 Unspecified abdominal pain: Secondary | ICD-10-CM | POA: Diagnosis not present

## 2016-01-16 DIAGNOSIS — R1011 Right upper quadrant pain: Secondary | ICD-10-CM | POA: Diagnosis not present

## 2016-01-16 DIAGNOSIS — K269 Duodenal ulcer, unspecified as acute or chronic, without hemorrhage or perforation: Secondary | ICD-10-CM | POA: Diagnosis not present

## 2016-03-21 ENCOUNTER — Telehealth: Payer: Self-pay | Admitting: Cardiology

## 2016-03-21 NOTE — Telephone Encounter (Signed)
Called pt to inform him that he has refills left at his pharmacy for the lisinopril and that he would have to refill his metformin with his PCP and if he has any other problems, questions or concerns to call the office. Pt verbalized understanding.

## 2016-03-21 NOTE — Telephone Encounter (Signed)
Patient said that he may not have any refills on Lisinipril and metformin per [facility] at [facility].

## 2016-03-26 DIAGNOSIS — E119 Type 2 diabetes mellitus without complications: Secondary | ICD-10-CM | POA: Diagnosis not present

## 2016-03-26 DIAGNOSIS — E1140 Type 2 diabetes mellitus with diabetic neuropathy, unspecified: Secondary | ICD-10-CM | POA: Diagnosis not present

## 2016-03-26 DIAGNOSIS — E785 Hyperlipidemia, unspecified: Secondary | ICD-10-CM | POA: Diagnosis not present

## 2016-03-26 DIAGNOSIS — D649 Anemia, unspecified: Secondary | ICD-10-CM | POA: Diagnosis not present

## 2016-03-26 DIAGNOSIS — Z720 Tobacco use: Secondary | ICD-10-CM | POA: Insufficient documentation

## 2016-03-29 DIAGNOSIS — D7589 Other specified diseases of blood and blood-forming organs: Secondary | ICD-10-CM | POA: Diagnosis not present

## 2016-04-12 DIAGNOSIS — H5203 Hypermetropia, bilateral: Secondary | ICD-10-CM | POA: Diagnosis not present

## 2016-04-12 DIAGNOSIS — E119 Type 2 diabetes mellitus without complications: Secondary | ICD-10-CM | POA: Diagnosis not present

## 2016-04-12 DIAGNOSIS — H52223 Regular astigmatism, bilateral: Secondary | ICD-10-CM | POA: Diagnosis not present

## 2016-06-12 DIAGNOSIS — J4521 Mild intermittent asthma with (acute) exacerbation: Secondary | ICD-10-CM | POA: Insufficient documentation

## 2016-06-12 DIAGNOSIS — B001 Herpesviral vesicular dermatitis: Secondary | ICD-10-CM | POA: Insufficient documentation

## 2016-06-12 DIAGNOSIS — K529 Noninfective gastroenteritis and colitis, unspecified: Secondary | ICD-10-CM | POA: Insufficient documentation

## 2016-07-12 ENCOUNTER — Encounter: Payer: Self-pay | Admitting: Cardiology

## 2016-07-13 DIAGNOSIS — Z8673 Personal history of transient ischemic attack (TIA), and cerebral infarction without residual deficits: Secondary | ICD-10-CM | POA: Insufficient documentation

## 2016-07-24 ENCOUNTER — Encounter: Payer: Self-pay | Admitting: Cardiology

## 2016-07-24 ENCOUNTER — Encounter: Payer: Self-pay | Admitting: *Deleted

## 2016-07-24 ENCOUNTER — Encounter (INDEPENDENT_AMBULATORY_CARE_PROVIDER_SITE_OTHER): Payer: Self-pay

## 2016-07-24 ENCOUNTER — Ambulatory Visit (INDEPENDENT_AMBULATORY_CARE_PROVIDER_SITE_OTHER): Payer: 59 | Admitting: Cardiology

## 2016-07-24 VITALS — BP 124/80 | HR 89 | Ht 74.5 in | Wt 230.0 lb

## 2016-07-24 DIAGNOSIS — I5023 Acute on chronic systolic (congestive) heart failure: Secondary | ICD-10-CM

## 2016-07-24 DIAGNOSIS — Z8249 Family history of ischemic heart disease and other diseases of the circulatory system: Secondary | ICD-10-CM

## 2016-07-24 DIAGNOSIS — I119 Hypertensive heart disease without heart failure: Secondary | ICD-10-CM

## 2016-07-24 DIAGNOSIS — E7849 Other hyperlipidemia: Secondary | ICD-10-CM

## 2016-07-24 DIAGNOSIS — I10 Essential (primary) hypertension: Secondary | ICD-10-CM | POA: Diagnosis not present

## 2016-07-24 DIAGNOSIS — E784 Other hyperlipidemia: Secondary | ICD-10-CM | POA: Diagnosis not present

## 2016-07-24 DIAGNOSIS — I110 Hypertensive heart disease with heart failure: Secondary | ICD-10-CM

## 2016-07-24 DIAGNOSIS — R072 Precordial pain: Secondary | ICD-10-CM

## 2016-07-24 DIAGNOSIS — E785 Hyperlipidemia, unspecified: Secondary | ICD-10-CM | POA: Insufficient documentation

## 2016-07-24 LAB — CBC WITH DIFFERENTIAL/PLATELET
Basophils Absolute: 0 10*3/uL (ref 0.0–0.2)
Basos: 1 %
EOS (ABSOLUTE): 0.2 10*3/uL (ref 0.0–0.4)
Eos: 3 %
Hematocrit: 43 % (ref 37.5–51.0)
Hemoglobin: 14.8 g/dL (ref 13.0–17.7)
Immature Grans (Abs): 0 10*3/uL (ref 0.0–0.1)
Immature Granulocytes: 0 %
Lymphocytes Absolute: 1.6 10*3/uL (ref 0.7–3.1)
Lymphs: 28 %
MCH: 32.5 pg (ref 26.6–33.0)
MCHC: 34.4 g/dL (ref 31.5–35.7)
MCV: 94 fL (ref 79–97)
Monocytes Absolute: 0.3 10*3/uL (ref 0.1–0.9)
Monocytes: 6 %
Neutrophils Absolute: 3.5 10*3/uL (ref 1.4–7.0)
Neutrophils: 62 %
Platelets: 273 10*3/uL (ref 150–379)
RBC: 4.56 x10E6/uL (ref 4.14–5.80)
RDW: 13.2 % (ref 12.3–15.4)
WBC: 5.6 10*3/uL (ref 3.4–10.8)

## 2016-07-24 LAB — COMPREHENSIVE METABOLIC PANEL
ALT: 18 IU/L (ref 0–44)
AST: 14 IU/L (ref 0–40)
Albumin/Globulin Ratio: 1.9 (ref 1.2–2.2)
Albumin: 4.3 g/dL (ref 3.5–5.5)
Alkaline Phosphatase: 79 IU/L (ref 39–117)
BUN/Creatinine Ratio: 17 (ref 9–20)
BUN: 16 mg/dL (ref 6–20)
Bilirubin Total: 0.3 mg/dL (ref 0.0–1.2)
CO2: 23 mmol/L (ref 18–29)
Calcium: 8.9 mg/dL (ref 8.7–10.2)
Chloride: 103 mmol/L (ref 96–106)
Creatinine, Ser: 0.92 mg/dL (ref 0.76–1.27)
GFR calc Af Amer: 124 mL/min/{1.73_m2} (ref >59)
GFR calc non Af Amer: 107 mL/min/{1.73_m2} (ref >59)
Globulin, Total: 2.3 g/dL (ref 1.5–4.5)
Glucose: 109 mg/dL — ABNORMAL HIGH (ref 65–99)
Potassium: 4.5 mmol/L (ref 3.5–5.2)
Sodium: 143 mmol/L (ref 134–144)
Total Protein: 6.6 g/dL (ref 6.0–8.5)

## 2016-07-24 LAB — LIPID PANEL
Chol/HDL Ratio: 2.6 ratio units (ref 0.0–5.0)
Cholesterol, Total: 141 mg/dL (ref 100–199)
HDL: 54 mg/dL (ref >39)
LDL Calculated: 72 mg/dL (ref 0–99)
Triglycerides: 77 mg/dL (ref 0–149)
VLDL Cholesterol Cal: 15 mg/dL (ref 5–40)

## 2016-07-24 LAB — TSH: TSH: 0.388 u[IU]/mL — ABNORMAL LOW (ref 0.450–4.500)

## 2016-07-24 NOTE — Progress Notes (Signed)
Patient ID: Joseph Shannon, male   DOB: Oct 13, 1980, 36 y.o.   MRN: [id number]    Patient Name: Joseph Shannon Date of Encounter: 07/24/2016  Primary Care Provider:  Eather Colas, FNP Primary Cardiologist:  Joseph Shannon  Patient Profile  Post-hospitalization visit  Problem List   Past Medical History:  Diagnosis Date  . Asthma   . CHF (congestive heart failure) (HCC)   . Diabetes mellitus without complication (HCC)   . Hypertension    Past Surgical History:  Procedure Laterality Date  . NO PAST SURGERIES      Allergies  Allergies  Allergen Reactions  . Naproxen Sodium Nausea And Vomiting    HPI  36 year old gentleman with history of hypertension and asthma who was seen in the emergency department for complaints of cough and chest pain and left sided weakness.   In May 2014 the patient presented with intermittent h/a for 4 days with elevated BP, but progressed to central chest tight and episode of coughing, BP elevated at 172/113. Cardiopulm exam benign. Neuro exam benign. EKG unchanged from prior. Pt admitted 5/13 for TIA/HTN, found to have possible apical hypokinesis, and benign appearing pineal cyst. He was started on lopressor & ACE inhibitor, asked to f/u with PCP. He has not yet been able to establish with one and has not been able to have stress testing as directed. CXR unremarkable, trop neg x2. Pt's h/a, CP, and BP improved after 1 SL NTG.   05/18/2013 The patient is coming here today and he states that he has just been almost daily, retrosternal pressure-like nonexertional but sometimes they feel like they're relieved by rest, he hasn't tried to use nitroglycerin with it.  The patient is very concerned as his mom with an early 39s just had a major myocardial infarction. His cousin just had a left ventricular assist device placed heart failure, the patient is not aware of etiology. The patient is asymptomatic. He brings a BP diary, his BP is consistently  elevated with values in 150-190 mmHg range.  Imdur 30 mg po daily and hydralazine 25 mg po TID was prescribed.  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 He reports that he continues to have exertional retrosternal pressure-like chest pain that's radiating to his neck and left arm. He feels tingling in his left arm on exertion and also night. He denies dizziness palpitations or syncope. He is compliant with his medications. His atorvastatin was most recently increased from 20-40 mg daily. He is very anxious about his chest pain as his first degree cousin died at age of 91 for myocardial infarction, and his grandfather just had another heart attack last Friday.  Home Medications  Prior to Admission medications   Medication Sig Start Date End Date Taking? Authorizing Provider  albuterol (PROVENTIL HFA;VENTOLIN HFA) 108 (90 BASE) MCG/ACT inhaler Inhale 2 puffs into the lungs daily.    Yes Historical Provider, MD  albuterol (PROVENTIL) (2.5 MG/3ML) 0.083% nebulizer solution Take 2.5 mg by nebulization every 6 (six) hours as needed for wheezing.   Yes Historical Provider, MD  fluticasone (FLOVENT HFA) 110 MCG/ACT inhaler Inhale 1 puff into the lungs 2 (two) times daily as needed (for wheezing).    Yes Historical Provider, MD  lisinopril-hydrochlorothiazide (PRINZIDE,ZESTORETIC) 20-25 MG per tablet Take 2 tablets by mouth daily. 02/04/13  Yes Joseph Murray, MD  metoprolol tartrate (LOPRESSOR) 25 MG tablet Take 1 tablet (25 mg total)  by mouth 2 (two) times daily. 02/04/13  Yes Joseph Murray, MD    Family History  Family History  Problem Relation Age of Onset  . Hypertension Mother   . Heart disease Mother   . Hypertension Father   . Heart disease Father   . Heart disease Paternal Uncle   . Heart disease Maternal Grandmother     Social History  Social History   Social History  . Marital status: Single    Spouse name: N/A  .  Number of children: N/A  . Years of education: N/A   Occupational History  . Not on file.   Social History Main Topics  . Smoking status: Current Every Day Smoker    Packs/day: 0.25    Types: Cigarettes  . Smokeless tobacco: Never Used  . Alcohol use Yes     Comment: 3 beers and a shot per day- last use yesterday  . Drug use: No  . Sexual activity: Not on file   Other Topics Concern  . Not on file   Social History Narrative  . No narrative on file     Review of Systems, as per history of present illness General:  No chills, fever, night sweats or weight changes.  Cardiovascular:  No chest pain, dyspnea on exertion, edema, orthopnea, palpitations, paroxysmal nocturnal dyspnea. Dermatological: No rash, lesions/masses Respiratory: No cough, dyspnea Urologic: No hematuria, dysuria Abdominal:   No nausea, vomiting, diarrhea, bright red blood per rectum, melena, or hematemesis Neurologic:  No visual changes, wkns, changes in mental status. All other systems reviewed and are otherwise negative except as noted above.  Physical Exam  General: Pleasant, NAD Psych: Normal affect. Neuro: Alert and oriented X 3. Moves all extremities spontaneously. HEENT: Normal  Neck: Supple without bruits or JVD. Lungs:  Resp regular and unlabored, CTA. Heart: RRR no s3, s4, or murmurs. Abdomen: Soft, non-tender, non-distended, BS + x 4.  Extremities: No clubbing, cyanosis, trace LE edema. DP/PT/Radials 2+ and equal bilaterally.  Accessory Clinical Findings  ECG - sinus rhythm 88 beats per minute, normal EKG  Lipid Panel     Component Value Date/Time   CHOL 185 08/31/2015 0920   TRIG 89 08/31/2015 0920   HDL 54 08/31/2015 0920   CHOLHDL 3.4 08/31/2015 0920   VLDL 18 08/31/2015 0920   LDLCALC 113 08/31/2015 0920   Duplex carotids; 09/19/12 Summary:  - No significant extracranial carotid artery stenosis demonstrated. Vertebrals are patent with antegrade flow. -  TTE 09/19/12  -  Left ventricle: The cavity size was normal. Wall thickness was increased in a pattern of mild LVH. Systolic function was normal. The estimated ejection fraction was in the range of 50% to 55%. Possible hypokinesis of the apical myocardium. Left ventricular diastolic function parameters were normal. - Left atrium: The atrium was mildly dilated. Impressions:  - Possible small area of apical hypokinesis; suggest fu study with contrast to further evaluate if clinically indicated.  TTE: 5/71/2016 Left ventricle: The cavity size was normal. Wall thickness was increased in a pattern of mild LVH. Systolic function was normal. The estimated ejection fraction was in the range of 50% to 55%. Wall motion was normal; there were no regional wall motion abnormalities. Features are consistent with a pseudonormal left ventricular filling pattern, with concomitant abnormal relaxation and increased filling pressure (grade 2 diastolic dysfunction).  ------------------------------------------------------------------- Aortic valve: Structurally normal valve. Cusp separation was normal. Doppler: Transvalvular velocity was within the normal range. There was no stenosis. There was no  regurgitation.  ------------------------------------------------------------------- Aorta: Aortic root: The aortic root was normal in size. Ascending aorta: The ascending aorta was normal in size.  ------------------------------------------------------------------- Mitral valve: Structurally normal valve. Leaflet separation was normal. Doppler: Transvalvular velocity was within the normal range. There was no evidence for stenosis. There was no regurgitation.  ------------------------------------------------------------------- Left atrium: The atrium was normal in size.  ------------------------------------------------------------------- Right ventricle: The cavity size was normal. Systolic function  was normal.  ------------------------------------------------------------------- Pulmonic valve: The valve appears to be grossly normal. Doppler: There was no significant regurgitation.  ------------------------------------------------------------------- Tricuspid valve: Structurally normal valve. Leaflet separation was normal. Doppler: Transvalvular velocity was within the normal range. There was no regurgitation.  ------------------------------------------------------------------- Right atrium: The atrium was mildly dilated.  ------------------------------------------------------------------- Pericardium: There was no pericardial effusion.  Exercise nuclear stress test: 02/26/2013 Quantitative Gated Spect Images  QGS EDV: 165 ml  QGS ESV: 90 ml  Impression  Exercise Capacity: Good exercise capacity.  BP Response: Hypertensive blood pressure response.  Clinical Symptoms: No chest pain.  ECG Impression: No significant ST segment change suggestive of ischemia.  Comparison with Prior Nuclear Study: No images to compare  Overall Impression: Low risk stress nuclear study. No evidence of ischemia. Good exercise tolerance. LV systolic function is mildly depressed.  LV Ejection Fraction: 45%. LV Wall Motion: Normal Wall Motion. No segmental wall motion abnormalities. EF mildly depressed.  Thomas Brackbill  ECG: Personally reviewed, performed 07/24/2016 shows normal sinus rhythm moderate criteria for LVH otherwise normal EKG and unchanged from prior.   Assessment & Plan  36 year old gentleman with a history of hypertension, possible TIA, pineal cyst, with significant family history of CAD with coming for concerns of chest pain.  1. Typical exertional chest pain with very significant family history of premature coronary artery disease including people in their 30s. He had negative stress test 4 years ago however continues to have symptoms, we will schedule a coronary CTA  with CT FFT to further evaluate.  2. Acute on chronic systolic CHF - the patient is euvolemic in fact he has lost 10 pounds since last year, continue lasix 40 mg po BID, repeat TTE in 2016 showed normal LVEF 50-55%.  3. Hypertension - controlled on current regimen, continue the same meds  Lasix 40 mg po BID Bystolic 10 mg po daily Imdur 60 mg po daily Hydralazine 75 mg po TID Lisinopril 40 mg po daily  4. Palpitations, history of TIA, mildly dilated left atrium - the patient couldn't efford eCardio monitor, he was enrolled for [employer] for [place]. 24H Holter was completely normal, no a-fib. Palpitations now resolved  5. Diabetic neuropathy -  HbA1c 6.4%, started on metformin 500 mg twice a day. We will increase gabapentin 300 mg po TID.  6. DM - new dg - we started him on Metformin.  7. Hyperlipidemia -recent Lipitor increased to 40 mg daily.  CBC, TSH, CMP, lipids today. Schedule coronary CTA and CT FFR for ischemia evaluation.  His test results normal follow-up in 1 year.   Joseph Alexander, MD 07/24/2016, 8:38 AM

## 2016-07-24 NOTE — Patient Instructions (Signed)
Medication Instructions:   Your physician recommends that you continue on your current medications as directed. Please refer to the Current Medication list given to you today.    Labwork:  TODAY---CMET, CBC W DIFF, TSH, AND LIPIDS     Testing/Procedures:  CORONARY CTA WITH CT FFR FOR DR NELSON TO READ     Follow-Up:  Your physician wants you to follow-up in: ONE YEAR WITH DR Johnell Comings will receive a reminder letter in the mail two months in advance. If you don't receive a letter, please call our office to schedule the follow-up appointment.        If you need a refill on your cardiac medications before your next appointment, please call your pharmacy.

## 2016-07-26 ENCOUNTER — Encounter: Payer: Self-pay | Admitting: Cardiology

## 2016-08-02 ENCOUNTER — Ambulatory Visit (HOSPITAL_COMMUNITY)
Admission: RE | Admit: 2016-08-02 | Discharge: 2016-08-02 | Disposition: A | Discharge status: Home / Self Care | Payer: 59 | Source: Ambulatory Visit | Attending: Cardiology | Admitting: Cardiology

## 2016-08-02 DIAGNOSIS — R072 Precordial pain: Secondary | ICD-10-CM | POA: Diagnosis not present

## 2016-08-02 DIAGNOSIS — J984 Other disorders of lung: Secondary | ICD-10-CM | POA: Insufficient documentation

## 2016-08-02 DIAGNOSIS — R590 Localized enlarged lymph nodes: Secondary | ICD-10-CM | POA: Diagnosis not present

## 2016-08-02 DIAGNOSIS — R918 Other nonspecific abnormal finding of lung field: Secondary | ICD-10-CM | POA: Insufficient documentation

## 2016-08-02 DIAGNOSIS — R079 Chest pain, unspecified: Secondary | ICD-10-CM

## 2016-08-02 MED ORDER — NITROGLYCERIN 0.4 MG SL SUBL
SUBLINGUAL_TABLET | SUBLINGUAL | Status: AC
  Filled 2016-08-02: qty 2

## 2016-08-02 MED ORDER — NITROGLYCERIN 0.4 MG SL SUBL: 0.8000 mg | SUBLINGUAL_TABLET | Freq: Once | SUBLINGUAL | Status: DC

## 2016-08-02 MED ORDER — METOPROLOL TARTRATE 5 MG/5ML IV SOLN
5.0000 mg | Freq: Once | INTRAVENOUS | Status: AC
  Administered 2016-08-02: 5 mg via INTRAVENOUS

## 2016-08-02 MED ORDER — IOPAMIDOL (ISOVUE-370) INJECTION 76%
INTRAVENOUS | Status: AC
  Administered 2016-08-02: 100 mL
  Filled 2016-08-02: qty 100

## 2016-08-02 MED ORDER — NITROGLYCERIN 0.4 MG SL SUBL: 0.4000 mg | SUBLINGUAL_TABLET | SUBLINGUAL | Status: DC | PRN

## 2016-08-02 MED ORDER — METOPROLOL TARTRATE 5 MG/5ML IV SOLN
INTRAVENOUS | Status: AC
  Filled 2016-08-02: qty 5

## 2016-08-07 ENCOUNTER — Telehealth: Payer: Self-pay | Admitting: *Deleted

## 2016-08-07 MED ORDER — AZITHROMYCIN 250 MG PO TABS: ORAL_TABLET | ORAL | 0 refills | Status: DC

## 2016-08-07 NOTE — Telephone Encounter (Signed)
-----   Message from Jama Flavors, LPN sent at 7/0/0174  5:48 PM EDT ----- Will route to Dr. Delton See for clarification on whether she wants 3-day or 5-day dose pack.

## 2016-08-07 NOTE — Telephone Encounter (Signed)
Notes recorded by Lars Masson, MD on 08/07/2016 at 7:36 AM EDT 5 day dose pack ------  Notes recorded by Mallory L Minichello, LPN on 01/01/5037 at 5:48 PM EDT Will route to Dr. Delton See for clarification on whether she wants 3-day or 5-day dose pack. ------  Notes recorded by Lars Masson, MD on 08/04/2016 at 3:02 PM EDT Normal coronary CTA, but evidence of a pneumonia, treatment with Z-pack as mentioned before. Please call him, please schedule an appointment in 2-3 weeks, can be with a PA. ------  Notes recorded by Lars Masson, MD on 08/03/2016 at 7:41 PM EDT Findings of pneumonia, please send him a prescription for a pack. Thank you, Joseph Shannon     Notified the pt that per Dr Delton See, his coronary ct was normal, but evidence of pneumonia was noted.  Informed the pt that per Dr Delton See, she recommends that he start taking Z-Pak 250 mg, complete full course packet, and return to the office to see an Extender in 2-3 weeks.  Confirmed the pharmacy of choice with the pt.  Advised the pt that he should take his Z-pak directly as prescribed.  Advised the pt to take 2 tabs today as his first initial dose, then take 1 tab po daily thereafter, until pack is complete.  Scheduled the pt a follow-up appt to see Joseph Medici PA-C, on 4/26 at 2:30 pm. Pt verbalized understanding and agrees with this plan.

## 2016-08-23 ENCOUNTER — Ambulatory Visit (INDEPENDENT_AMBULATORY_CARE_PROVIDER_SITE_OTHER): Payer: 59 | Admitting: Cardiology

## 2016-08-23 ENCOUNTER — Encounter: Payer: Self-pay | Admitting: Cardiology

## 2016-08-23 DIAGNOSIS — I110 Hypertensive heart disease with heart failure: Secondary | ICD-10-CM

## 2016-08-23 DIAGNOSIS — I10 Essential (primary) hypertension: Secondary | ICD-10-CM | POA: Diagnosis not present

## 2016-08-23 DIAGNOSIS — R0609 Other forms of dyspnea: Secondary | ICD-10-CM

## 2016-08-23 DIAGNOSIS — I5023 Acute on chronic systolic (congestive) heart failure: Secondary | ICD-10-CM | POA: Diagnosis not present

## 2016-08-23 MED ORDER — NEBIVOLOL HCL 20 MG PO TABS: ORAL_TABLET | ORAL | 3 refills | Status: DC

## 2016-08-23 NOTE — Patient Instructions (Addendum)
Your physician has recommended you make the following change in your medication:  -- INCREASE bystolic to 20mg  daily  Your physician has requested that you have an echocardiogram. Echocardiography is a painless test that uses sound waves to create images of your heart. It provides your doctor with information about the size and shape of your heart and how well your heart's chambers and valves are working. This procedure takes approximately one hour. There are no restrictions for this procedure.  Your physician recommends that you schedule a follow-up appointment in: 3-4 weeks after echocardiogram with Robbie Lis, PA or Dr. Delton See

## 2016-08-23 NOTE — Progress Notes (Signed)
08/23/2016 Joseph Shannon   1981/01/10  [id number]  Primary Physician Deborah Chalk, FNP Primary Cardiologist: Dr. Meda Coffee    Reason for Visit/CC: F/u for Chest Pain and Dyspnea; recent Tx for PNA  HPI:  Joseph Shannon is a 36 y.o. male, followed by Dr. Meda Coffee, who presents to clinic for 2 week f/u. His PMH is notable for poorly controlled HTN, asthma, OSA not 100% compliant with CPAP, systolic HF, family h/o CAD (mom MI in early 30s, cousin with advanced HF). Per chart review, he had a NST in 2014 that was negative for ischemia but with mildly decreased LV function. He was placed on medical therapy. Repeat echo in 2016 showed normalization of EF back to normal. He has remained on a lot of cardiac meds>>Lasix, hydralazine, Imdur, Lisinopril, Bystolic and Lipitor.  He was recently seen by Dr. Meda Coffee on 07/24/16 and complained of exertional CP and dyspnea. She ordered a coronary CTA with calcium score. This showed no evidence of CAD. Calcium score was 0. There was however evidence of PNA. Dr. Meda Coffee placed him on a Zpack. He presents back for f/u.  He completed course of antibiotic. Somewhat improved but still with exertional CP and dyspnea. Feels like chest pressure. No sharp or pleuritic pain. No cough, fever or chills. BP is elevated at 150/78. However he reports that this is "good" for him. Usually much higher in the 295M systolic. He also has not been fully compliant with his maintenance inhaler for his asthma. He reports that he was not regularly using his Flovent. His PCP recently advised that he start back using this twice a day.   Current Meds  Medication Sig  . albuterol (PROVENTIL HFA;VENTOLIN HFA) 108 (90 BASE) MCG/ACT inhaler Inhale 2 puffs into the lungs daily.   Marland Kitchen albuterol (PROVENTIL) (2.5 MG/3ML) 0.083% nebulizer solution Take 2.5 mg by nebulization every 4 (four) hours as needed for wheezing or shortness of breath.  Marland Kitchen atorvastatin (LIPITOR) 40 MG tablet Take 40 mg by mouth  daily.  . fluticasone (FLOVENT HFA) 110 MCG/ACT inhaler 3 puffs twice daily  . furosemide (LASIX) 40 MG tablet Take 1 tablet (40 mg total) by mouth 2 (two) times daily. Take 40 mg at 8 am and 40 mg at 2 pm.  . gabapentin (NEURONTIN) 300 MG capsule Take 1 capsule (300 mg total) by mouth 3 (three) times daily.  Marland Kitchen glucose monitoring kit (FREESTYLE) monitoring kit 1 each by Does not apply route as needed for other. Dispense any model that is covered- dispense testing supplies for Q AC/ HS accuchecks- 1 month supply with one refil.  . hydrALAZINE (APRESOLINE) 25 MG tablet TAKE 3 TABLETS AT BREAKFAST, 3 TABLETS AT LUNCH, AND 3 TABLETS AT DINNER TO TOTAL 225 MG  . isosorbide mononitrate (IMDUR) 60 MG 24 hr tablet Take 60 mg by mouth 2 (two) times daily.  Marland Kitchen lisinopril (PRINIVIL,ZESTRIL) 40 MG tablet Take 1 tablet (40 mg total) by mouth daily.  . metFORMIN (GLUCOPHAGE) 500 MG tablet Take 1 tablet (500 mg total) by mouth 2 (two) times daily with a meal.  . nebivolol (BYSTOLIC) 10 MG tablet TAKE DAILY AT BEDTIME  . nitroGLYCERIN (NITROSTAT) 0.4 MG SL tablet Place 1 tablet (0.4 mg total) under the tongue every 5 (five) minutes as needed for chest pain.  Marland Kitchen omeprazole (PRILOSEC) 20 MG capsule Take 1 capsule (20 mg total) by mouth daily.  . [DISCONTINUED] isosorbide mononitrate (IMDUR) 60 MG 24 hr tablet Take 1 tablet (60 mg total) by mouth  daily.   Allergies  Allergen Reactions  . Naproxen Sodium Nausea And Vomiting   Past Medical History:  Diagnosis Date  . Asthma   . CHF (congestive heart failure) ([place])   . Diabetes mellitus without complication ([place])   . Hypertension    Family History  Problem Relation Age of Onset  . Hypertension Mother   . Heart disease Mother   . Hypertension Father   . Heart disease Father   . Heart disease Paternal Uncle   . Heart disease Maternal Grandmother    Past Surgical History:  Procedure Laterality Date  . NO PAST SURGERIES     Social History   Social  History  . Marital status: Single    Spouse name: N/A  . Number of children: N/A  . Years of education: N/A   Occupational History  . Not on file.   Social History Main Topics  . Smoking status: Current Every Day Smoker    Packs/day: 0.25    Types: Cigarettes  . Smokeless tobacco: Never Used  . Alcohol use Yes     Comment: 3 beers and a shot per day- last use yesterday  . Drug use: No  . Sexual activity: Not on file   Other Topics Concern  . Not on file   Social History Narrative  . No narrative on file     Review of Systems: General: negative for chills, fever, night sweats or weight changes.  Cardiovascular: negative for chest pain, dyspnea on exertion, edema, orthopnea, palpitations, paroxysmal nocturnal dyspnea or shortness of breath Dermatological: negative for rash Respiratory: negative for cough or wheezing Urologic: negative for hematuria Abdominal: negative for nausea, vomiting, diarrhea, bright red blood per rectum, melena, or hematemesis Neurologic: negative for visual changes, syncope, or dizziness All other systems reviewed and are otherwise negative except as noted above.   Physical Exam:  Blood pressure (!) 150/78, pulse 87, height 6' 3"  (1.905 m), weight 230 lb (104.3 kg), SpO2 98 %.  General appearance: alert, cooperative, no distress and moderately obese Neck: no carotid bruit and no JVD Lungs: clear to auscultation bilaterally Heart: regular rate and rhythm, S1, S2 normal, no murmur, click, rub or gallop Extremities: extremities normal, atraumatic, no cyanosis or edema Pulses: 2+ and symmetric Skin: Skin color, texture, turgor normal. No rashes or lesions Neurologic: Grossly normal  EKG not performed -- personally reviewed   ASSESSMENT AND PLAN:   1. Chest Pain and Pressure: Pt still with substernal chest pressure/dyspnea despite treatment with antibiotic (prescribed as coronary CTA showed signs of pneumonia). CT scan was also negative for  evidence of coronary artery disease with a low calcium score of 0. He does not have any other typical symptoms of pneumonia including no pleuritic or sharp chest pain. Also no cough fever or chills. Lung exam is clear. Will not repeat antibiotic course. He needs additional blood pressure control. We will increase his Bystolic to 20 mg daily. He is already taking Imdur 60 mg twice a day as well as 40 mg lisinopril, 25 mg of hydralazine 3 times a day as well as Lasix 40 mg twice a day. Also check a 2-D echocardiogram given his prior history of LV dysfunction and poorly controlled HTN. Also assess PA pressure. He was also encouraged to continue regular compliance with his maintenance inhalers for his asthma as this may also be contributing to his symptoms. Patient has not been using his Flovent regularly. He is now on this twice a day. Follow-up in 3-4  weeks for repeat assessmen  Joseph Shannon, MHS [facility] 08/23/2016 3:46 PM

## 2016-09-05 ENCOUNTER — Other Ambulatory Visit: Payer: Self-pay | Admitting: Cardiology

## 2016-09-05 ENCOUNTER — Other Ambulatory Visit: Payer: Self-pay

## 2016-09-05 ENCOUNTER — Ambulatory Visit (HOSPITAL_COMMUNITY): Payer: 59 | Attending: Cardiovascular Disease

## 2016-09-05 DIAGNOSIS — I110 Hypertensive heart disease with heart failure: Secondary | ICD-10-CM

## 2016-09-05 DIAGNOSIS — R0609 Other forms of dyspnea: Secondary | ICD-10-CM | POA: Diagnosis not present

## 2016-09-05 DIAGNOSIS — G4730 Sleep apnea, unspecified: Secondary | ICD-10-CM

## 2016-09-05 DIAGNOSIS — I5023 Acute on chronic systolic (congestive) heart failure: Secondary | ICD-10-CM

## 2016-09-05 DIAGNOSIS — R079 Chest pain, unspecified: Secondary | ICD-10-CM

## 2016-09-05 DIAGNOSIS — I10 Essential (primary) hypertension: Secondary | ICD-10-CM

## 2016-09-06 ENCOUNTER — Telehealth: Payer: Self-pay | Admitting: Cardiology

## 2016-09-06 NOTE — Telephone Encounter (Signed)
Pt returning your call

## 2016-09-06 NOTE — Telephone Encounter (Signed)
-----   Message from [place], [place] sent at 09/06/2016  2:57 PM EDT ----- Pump function is normal. No significant abnormalities noted on heart ultrasound.

## 2016-09-09 ENCOUNTER — Other Ambulatory Visit: Payer: Self-pay | Admitting: Cardiology

## 2016-09-09 DIAGNOSIS — R079 Chest pain, unspecified: Secondary | ICD-10-CM

## 2016-09-09 DIAGNOSIS — I10 Essential (primary) hypertension: Secondary | ICD-10-CM

## 2016-09-09 DIAGNOSIS — G4730 Sleep apnea, unspecified: Secondary | ICD-10-CM

## 2016-09-09 DIAGNOSIS — I5023 Acute on chronic systolic (congestive) heart failure: Secondary | ICD-10-CM

## 2016-09-09 DIAGNOSIS — I110 Hypertensive heart disease with heart failure: Secondary | ICD-10-CM

## 2016-09-09 DIAGNOSIS — R0609 Other forms of dyspnea: Secondary | ICD-10-CM

## 2016-09-10 ENCOUNTER — Telehealth: Payer: Self-pay | Admitting: Cardiology

## 2016-09-10 NOTE — Telephone Encounter (Signed)
Called pt to inform him that he has refills at his pharmacy. Called pharmacy and they stated that pt has refills at their pharmacy and that the pt's medication is ready to be picked up. I inform pt of this and advised pt that if he has any other problems, questions or concerns to call our office. Pt verbalized understanding.

## 2016-09-10 NOTE — Telephone Encounter (Signed)
New Message      *STAT* If patient is at the pharmacy, call can be transferred to refill team.   1. Which medications need to be refilled? (please list name of each medication and dose if known)   isosorbide mononitrate (IMDUR) 60 MG 24 hr tablet Take 1 tablet (60 mg total) by mouth daily.     2. Which pharmacy/location (including street and city if local pharmacy) is medication to be sent to? [facility] - [place]   3. Do they need a 30 day or 90 day supply? 30  Has appt 09/11/16

## 2016-09-11 ENCOUNTER — Ambulatory Visit: Payer: 59 | Admitting: Cardiology

## 2016-09-12 ENCOUNTER — Encounter: Payer: Self-pay | Admitting: Cardiology

## 2016-11-06 ENCOUNTER — Other Ambulatory Visit: Payer: Self-pay | Admitting: Cardiology

## 2016-11-06 DIAGNOSIS — I10 Essential (primary) hypertension: Secondary | ICD-10-CM

## 2016-11-06 DIAGNOSIS — R0609 Other forms of dyspnea: Secondary | ICD-10-CM

## 2016-11-06 DIAGNOSIS — R079 Chest pain, unspecified: Secondary | ICD-10-CM

## 2016-11-06 DIAGNOSIS — I5023 Acute on chronic systolic (congestive) heart failure: Secondary | ICD-10-CM

## 2016-11-06 DIAGNOSIS — I110 Hypertensive heart disease with heart failure: Secondary | ICD-10-CM

## 2016-11-06 DIAGNOSIS — G4730 Sleep apnea, unspecified: Secondary | ICD-10-CM

## 2017-03-11 ENCOUNTER — Other Ambulatory Visit: Payer: Self-pay

## 2017-03-11 DIAGNOSIS — I10 Essential (primary) hypertension: Secondary | ICD-10-CM

## 2017-03-11 DIAGNOSIS — R079 Chest pain, unspecified: Secondary | ICD-10-CM

## 2017-03-11 DIAGNOSIS — G4730 Sleep apnea, unspecified: Secondary | ICD-10-CM

## 2017-03-11 DIAGNOSIS — I110 Hypertensive heart disease with heart failure: Secondary | ICD-10-CM

## 2017-03-11 DIAGNOSIS — I5023 Acute on chronic systolic (congestive) heart failure: Secondary | ICD-10-CM

## 2017-03-11 DIAGNOSIS — R0609 Other forms of dyspnea: Secondary | ICD-10-CM

## 2017-03-11 MED ORDER — NEBIVOLOL HCL 20 MG PO TABS: ORAL_TABLET | ORAL | 1 refills | Status: DC

## 2017-03-11 MED ORDER — ISOSORBIDE MONONITRATE ER 60 MG PO TB24: 60.0000 mg | ORAL_TABLET | Freq: Every day | ORAL | 1 refills | Status: DC

## 2017-03-14 ENCOUNTER — Other Ambulatory Visit: Payer: Self-pay | Admitting: Physician Assistant

## 2017-03-14 ENCOUNTER — Ambulatory Visit: Payer: 59 | Admitting: Physician Assistant

## 2017-03-14 ENCOUNTER — Ambulatory Visit (HOSPITAL_COMMUNITY)
Admission: RE | Admit: 2017-03-14 | Discharge: 2017-03-14 | Disposition: A | Discharge status: Home / Self Care | Payer: 59 | Source: Ambulatory Visit | Attending: Physician Assistant | Admitting: Physician Assistant

## 2017-03-14 ENCOUNTER — Encounter: Payer: Self-pay | Admitting: Physician Assistant

## 2017-03-14 VITALS — BP 156/92 | HR 85 | Resp 16 | Ht 74.0 in | Wt 231.8 lb

## 2017-03-14 DIAGNOSIS — R7989 Other specified abnormal findings of blood chemistry: Secondary | ICD-10-CM

## 2017-03-14 DIAGNOSIS — I10 Essential (primary) hypertension: Secondary | ICD-10-CM | POA: Diagnosis not present

## 2017-03-14 DIAGNOSIS — R202 Paresthesia of skin: Secondary | ICD-10-CM

## 2017-03-14 DIAGNOSIS — R200 Anesthesia of skin: Secondary | ICD-10-CM

## 2017-03-14 DIAGNOSIS — I110 Hypertensive heart disease with heart failure: Secondary | ICD-10-CM | POA: Diagnosis not present

## 2017-03-14 DIAGNOSIS — I771 Stricture of artery: Secondary | ICD-10-CM | POA: Insufficient documentation

## 2017-03-14 DIAGNOSIS — I5042 Chronic combined systolic (congestive) and diastolic (congestive) heart failure: Secondary | ICD-10-CM | POA: Diagnosis not present

## 2017-03-14 DIAGNOSIS — G930 Cerebral cysts: Secondary | ICD-10-CM | POA: Diagnosis not present

## 2017-03-14 DIAGNOSIS — R002 Palpitations: Secondary | ICD-10-CM

## 2017-03-14 DIAGNOSIS — R079 Chest pain, unspecified: Secondary | ICD-10-CM

## 2017-03-14 LAB — CBC WITH DIFFERENTIAL/PLATELET
BASOS: 0 %
Basophils Absolute: 0 10*3/uL (ref 0.0–0.2)
EOS (ABSOLUTE): 0.1 10*3/uL (ref 0.0–0.4)
EOS: 2 %
HEMATOCRIT: 42.9 % (ref 37.5–51.0)
Hemoglobin: 14.8 g/dL (ref 13.0–17.7)
IMMATURE GRANS (ABS): 0 10*3/uL (ref 0.0–0.1)
IMMATURE GRANULOCYTES: 0 %
LYMPHS: 30 %
Lymphocytes Absolute: 1.3 10*3/uL (ref 0.7–3.1)
MCH: 32.7 pg (ref 26.6–33.0)
MCHC: 34.5 g/dL (ref 31.5–35.7)
MCV: 95 fL (ref 79–97)
Monocytes Absolute: 0.4 10*3/uL (ref 0.1–0.9)
Monocytes: 10 %
NEUTROS ABS: 2.5 10*3/uL (ref 1.4–7.0)
Neutrophils: 58 %
PLATELETS: 244 10*3/uL (ref 150–379)
RBC: 4.53 x10E6/uL (ref 4.14–5.80)
RDW: 12.7 % (ref 12.3–15.4)
WBC: 4.3 10*3/uL (ref 3.4–10.8)

## 2017-03-14 LAB — BASIC METABOLIC PANEL
BUN/Creatinine Ratio: 14 (ref 9–20)
BUN: 15 mg/dL (ref 6–20)
CALCIUM: 9.3 mg/dL (ref 8.7–10.2)
CHLORIDE: 105 mmol/L (ref 96–106)
CO2: 24 mmol/L (ref 20–29)
Creatinine, Ser: 1.05 mg/dL (ref 0.76–1.27)
GFR calc non Af Amer: 91 mL/min/{1.73_m2} (ref >59)
GFR, EST AFRICAN AMERICAN: 105 mL/min/{1.73_m2} (ref >59)
Glucose: 104 mg/dL — ABNORMAL HIGH (ref 65–99)
POTASSIUM: 4.8 mmol/L (ref 3.5–5.2)
Sodium: 143 mmol/L (ref 134–144)

## 2017-03-14 LAB — POCT I-STAT CREATININE: Creatinine, Ser: 1 mg/dL (ref 0.61–1.24)

## 2017-03-14 LAB — T4, FREE: Free T4: 1.37 ng/dL (ref 0.82–1.77)

## 2017-03-14 LAB — T3, FREE: T3 FREE: 3.4 pg/mL (ref 2.0–4.4)

## 2017-03-14 LAB — TSH: TSH: 0.518 u[IU]/mL (ref 0.450–4.500)

## 2017-03-14 LAB — MAGNESIUM: Magnesium: 2.1 mg/dL (ref 1.6–2.3)

## 2017-03-14 MED ORDER — GADOBENATE DIMEGLUMINE 529 MG/ML IV SOLN
20.0000 mL | Freq: Once | INTRAVENOUS | Status: AC
  Administered 2017-03-14: 20 mL via INTRAVENOUS

## 2017-03-14 MED ORDER — FUROSEMIDE 40 MG PO TABS: 40.0000 mg | ORAL_TABLET | Freq: Two times a day (BID) | ORAL | 1 refills | Status: DC

## 2017-03-14 MED ORDER — ASPIRIN EC 81 MG PO TBEC: 81.0000 mg | DELAYED_RELEASE_TABLET | Freq: Every day | ORAL | 3 refills | Status: DC

## 2017-03-14 NOTE — Patient Instructions (Addendum)
Medication Instructions: Your physician has recommended you make the following change in your medication:  -1) START Aspirin 81 mg - Take 1 tablet by mouth daily  Labwork: Your physician has recommended that you have lab work today: Free T3, Free T4, TSH, Mag, CBC, BMET  Procedures/Testing: Your physician has requested that you have a Brain MRI. Brain MRI uses a computer to create images of your heart as its beating, producing both still and moving pictures of your heart and major blood vessels. For further information please visit [email or web address]. Please follow the instruction sheet given to you today for more information.  Your physician has recommended that you wear a 30 day event monitor. Event monitors are medical devices that record the heart's electrical activity. Doctors most often [place] these monitors to diagnose arrhythmias. Arrhythmias are problems with the speed or rhythm of the heartbeat. The monitor is a small, portable device. You can wear one while you do your normal daily activities. This is usually used to diagnose what is causing palpitations/syncope (passing out).    Follow-Up: Your physician recommends that you schedule a follow-up appointment in: 6 WEEKS with an APP on Dr. Lindaann Slough Team   If you need a refill on your cardiac medications before your next appointment, please call your pharmacy.

## 2017-03-14 NOTE — Progress Notes (Signed)
Cardiology Office Note    Date:  03/14/2017  ID:  Joseph Shannon, DOB 1980/11/17, MRN [id number] PCP:  Deborah Chalk, FNP  Cardiologist:  Dr. Meda Coffee   Chief Complaint: chest pain  History of Present Illness:  Joseph Shannon is a 36 y.o. male with history of TIA 2014, poorly controlled HTN (renal duplex normal 2015), asthma, chronic combined CHF (previous EF minimally decreased in 2014), OSA previously not 100% compliant with CPAP, family history of CAD (mom MI in early 65s, cousin died at 58 of MI, cousin with advanced HF), diabetes mellitus with neuropathy, hyperlipidemia, TIA. Per chart review, he had a stress test in 2014 that was negative for ischemia but with mildly decreased LV function EF 45% without segmental WMA. 2D Echo at that time showed EF 50-55% with possible HK of the apical myocardium, normal disaotlic parameters, mild LAE. He was placed on medical therapy. He had been admitted in 2014 with arm numbness with suspicion for TIA. MRI at that time showed no acute stroke but "Bulge superior margin of the M1 segment of the left middle cerebral artery appears to be an origin of a vessel on source sequence. Aneurysm felt a less likely consideration.  Stability can be confirmed on follow-up." Also with abnormal pineal gland with recommendation for f/u MRI in 3 months but do not see this occurring.  Repeat echo in 2016 showed normalization of EF at 50-55%. Coronary CTA 07/2016 done for chest pain showed calcium score of zero and no evidence of CAD, +mildly dilated pulmonary artery suggestive of pulmonary hypertension. He was last seen in 08/2016 by Lyda Jester for continued chest discomfort in setting of recently diagnosed PNA. 2D echo 08/2016 showed EF 55-60%, grade 1 DD, normal RV. Last labs 06/2016 showed normal CBC, LDL 72, Cr 0.92, normal LFTs, abnormal TSH of 0.388.  He presents for acute visit today to discuss multiple issues: 1) For 2 weeks he has had intermittent chest  tightness associated with palpitations as though heart is racing. This has happened a few times a week. These two complaints go hand in hand, unprovoked. Not worse with exertion, inspiration, palpation. They can last minutes to hours. He has not checked his HR to be able to see what the BPM is. They resolve spontaneously, mostly by being still/quiet, relaxing and laying down. He denies any recent change in sleep pattern, caffeine or stress. No LEE, orthopnea, PND, syncope. Weight fluctuates. He does not currently feel this symptom. 2) Elevated blood pressure - ran out of Lasix 3 weeks ago. It was making him urinate a lot. Does drink a lot of fluid per day, at least 3 bottles of water at work then continuously at home as well. 3) Yesterday in the afternoon while tending to younger family members he noticed onset of left arm numbness and generalized fatigue that lasted several hours. This felt different than prior neuropathy. He denies ever losing function of the arm but states it felt unusual, and similar to admission in 2014 when he was diagnosed with TIA. No speech issues, acute weakness, visual changes. No focal neuro symptom today.   He carries a lot of anxiety due to family history of heart problems.  Past Medical History:  Diagnosis Date  . Asthma   . Chronic combined systolic and diastolic CHF (congestive heart failure) ([place])   . Diabetes mellitus without complication ([place])   . Hypertension   . NICM (nonischemic cardiomyopathy) ([place])   . Normal coronary arteries   . OSA (  obstructive sleep apnea)   . TIA (transient ischemic attack) 2014    Past Surgical History:  Procedure Laterality Date  . NO PAST SURGERIES      Current Medications: Current Meds  Medication Sig  . albuterol (PROVENTIL HFA;VENTOLIN HFA) 108 (90 BASE) MCG/ACT inhaler Inhale 2 puffs into the lungs daily.   Marland Kitchen albuterol (PROVENTIL) (2.5 MG/3ML) 0.083% nebulizer solution Take 2.5 mg by nebulization every 4 (four) hours as  needed for wheezing or shortness of breath.  Marland Kitchen atorvastatin (LIPITOR) 40 MG tablet Take 40 mg by mouth daily.  . fluticasone (FLOVENT HFA) 110 MCG/ACT inhaler 3 puffs twice daily  . furosemide (LASIX) 40 MG tablet Take 1 tablet (40 mg total) 2 (two) times daily by mouth. Take 40 mg at 8 am and 40 mg at 2 pm.  . gabapentin (NEURONTIN) 300 MG capsule Take 1 capsule (300 mg total) by mouth 3 (three) times daily.  Marland Kitchen glucose monitoring kit (FREESTYLE) monitoring kit 1 each by Does not apply route as needed for other. Dispense any model that is covered- dispense testing supplies for Q AC/ HS accuchecks- 1 month supply with one refil.  . hydrALAZINE (APRESOLINE) 25 MG tablet Take 3 tablets by mouth at breakfast, 3 tablets by mouth at lunch and 3 tablets by mouth at dinner to total 225 mg  . isosorbide mononitrate (IMDUR) 60 MG 24 hr tablet Take 60 mg by mouth 2 (two) times daily.  . isosorbide mononitrate (IMDUR) 60 MG 24 hr tablet Take 1 tablet (60 mg total) daily by mouth.  Marland Kitchen lisinopril (PRINIVIL,ZESTRIL) 40 MG tablet Take 1 tablet (40 mg total) by mouth daily.  . metFORMIN (GLUCOPHAGE) 500 MG tablet Take 1 tablet (500 mg total) by mouth 2 (two) times daily with a meal.  . Nebivolol HCl 20 MG TABS TAKE ONE TABLET BY MOUTH DAILY AT BEDTIME  . nitroGLYCERIN (NITROSTAT) 0.4 MG SL tablet Place 1 tablet (0.4 mg total) under the tongue every 5 (five) minutes as needed for chest pain.  Marland Kitchen omeprazole (PRILOSEC) 20 MG capsule Take 1 capsule (20 mg total) by mouth daily.  . [DISCONTINUED] furosemide (LASIX) 40 MG tablet Take 1 tablet (40 mg total) by mouth 2 (two) times daily. Take 40 mg at 8 am and 40 mg at 2 pm.     Allergies:   Naproxen sodium   Social History   Socioeconomic History  . Marital status: Single    Spouse name: None  . Number of children: None  . Years of education: None  . Highest education level: None  Social Needs  . Financial resource strain: None  . Food insecurity - worry: None    . Food insecurity - inability: None  . Transportation needs - medical: None  . Transportation needs - non-medical: None  Occupational History  . None  Tobacco Use  . Smoking status: Current Every Day Smoker    Packs/day: 0.25    Types: Cigarettes  . Smokeless tobacco: Never Used  Substance and Sexual Activity  . Alcohol use: Yes    Comment: 3 beers and a shot per day- last use yesterday  . Drug use: No  . Sexual activity: None  Other Topics Concern  . None  Social History Narrative  . None     Family History:  Family History  Problem Relation Age of Onset  . Hypertension Mother   . Heart disease Mother   . Hypertension Father   . Heart disease Father   . Heart  disease Paternal Uncle   . Heart disease Maternal Grandmother    ROS:   Please see the history of present illness. All other systems are reviewed and otherwise negative.    PHYSICAL EXAM:   VS:  BP (!) 156/92   Pulse 85   Resp 16   Ht 6' 2"  (1.88 m)   Wt 231 lb 12.8 oz (105.1 kg)   SpO2 98%   BMI 29.76 kg/m   BMI: Body mass index is 29.76 kg/m. GEN: Well nourished, well developed AAM, in no acute distress  HEENT: normocephalic, atraumatic Neck: no JVD, carotid bruits, or masses Cardiac: RRR; no murmurs, rubs, or gallops, no edema  Respiratory:  clear to auscultation bilaterally, normal work of breathing GI: soft, nontender, nondistended, + BS MS: no deformity or atrophy  Skin: warm and dry, no rash Neuro:  Alert and Oriented x 3, Strength and sensation are intact, follows commands, nonfocal, normal speech, no facial asymmetry, normal gait and strength Psych: euthymic mood, full affect  Wt Readings from Last 3 Encounters:  03/14/17 231 lb 12.8 oz (105.1 kg)  08/23/16 230 lb (104.3 kg)  07/24/16 230 lb (104.3 kg)      Studies/Labs Reviewed:   EKG:  EKG was ordered today and personally reviewed by me and demonstrates NSR 82bpm, nonspecific ST-T Changes with probable LVH. No change from  prior.  Recent Labs: 07/24/2016: ALT 18; BUN 16; Creatinine, Ser 0.92; Hemoglobin 14.8; Platelets 273; Potassium 4.5; Sodium 143; TSH 0.388   Lipid Panel    Component Value Date/Time   CHOL 141 07/24/2016 0922   TRIG 77 07/24/2016 0922   HDL 54 07/24/2016 0922   CHOLHDL 2.6 07/24/2016 0922   CHOLHDL 3.4 08/31/2015 0920   VLDL 18 08/31/2015 0920   LDLCALC 72 07/24/2016 0922    Additional studies/ records that were reviewed today include: Summarized above.    ASSESSMENT & PLAN:   1. Chest tightness/palpitations - I originally contemplated obtaining 48-hour monitor, however, with prior history of TIA in 2014 and now recent left arm numbness, would prefer to obtain 30-day monitor to exclude atrial fib. Will also check labs. Discomfort could also be partially related to high blood pressure; will re-initiate Lasix as below. He is not tachycardic, tachypneic or hypoxic. EKG unchanged from prior and recent CT was without CAD, so doubt ischemic etiology. 2. Left arm numbness - d/w Dr. Meda Coffee. Patient is concerned as this reminds him of what he had when he was diagnosed with TIA in 2014. He had abnormal findings on MRA of the head in the past of a possible bulge in his left middle cerebral artery, aneurysm less likely but recommended to confirm on follow-up. He also had a pineal gland abnormality as well. Do not see this was ever completed. Therefore we will proceed with MRI of the brain as well as MRA to ensure no recent changes. CT would not allow [place] to see structure of vessels. He was previously on baby aspirin but it is unclear why exactly this was stopped. He thought maybe he was told it made his blood too thin but he has no prior history of anemia or bleeding. Will re-initiate at low dose. May need to refer to neurology. 3. Chronic combined CHF - appears euvolemic except suboptimal BP control. Restart Lasix. 4. Essential HTN - he has been out of Lasix for 3 weeks. Also reports excessive  urination while on this but we discussed how he was taking in a lot of excess fluid  per day, thus fluid in = fluid out. Reviewed 2g sodium restriction, 2L fluid restriction with patient. Also needs f/u of his abnormal thyroid function with suppressed TSH earlier this year. If blood pressure remains high on recheck visit and no signs of hyperthyroidism, may need to consider updating evaluation for other secondary causes. Renal duplex in 2015 was unremarkable. He reports recent compliance with CPAP. 5. Decreased TSH - recheck today with free hormones.   Disposition: F/u with Dr. Rudi Coco team APP in 6 weeks. Warning sx reviewed with patient.  Medication Adjustments/Labs and Tests Ordered: Current medicines are reviewed at length with the patient today.  Concerns regarding medicines are outlined above. Medication changes, Labs and Tests ordered today are summarized above and listed in the Patient Instructions accessible in Encounters.   Signed, Charlie Pitter, PA-C  03/14/2017 10:32 AM    [facility] [place], [place],   [address] Phone: [phone]; Fax: [phone]

## 2017-03-28 ENCOUNTER — Ambulatory Visit (INDEPENDENT_AMBULATORY_CARE_PROVIDER_SITE_OTHER): Payer: 59

## 2017-03-28 DIAGNOSIS — R002 Palpitations: Secondary | ICD-10-CM | POA: Diagnosis not present

## 2017-04-25 ENCOUNTER — Encounter: Payer: Self-pay | Admitting: Cardiology

## 2017-04-25 ENCOUNTER — Ambulatory Visit: Payer: 59 | Admitting: Cardiology

## 2017-04-25 VITALS — BP 152/100 | HR 82 | Ht 75.0 in | Wt 231.0 lb

## 2017-04-25 DIAGNOSIS — I10 Essential (primary) hypertension: Secondary | ICD-10-CM

## 2017-04-25 MED ORDER — HYDRALAZINE HCL 100 MG PO TABS: 100.0000 mg | ORAL_TABLET | Freq: Three times a day (TID) | ORAL | 3 refills | Status: DC

## 2017-04-25 NOTE — Patient Instructions (Signed)
Medication Instructions:  1. Start Hydralazine 100 mg by mouth three times daily. (The strength of the tablet has changed. You only need to take 1 tablet three times daily)  Labwork: None ordered  Testing/Procedures: None ordered  Follow-Up: Your physician recommends that you schedule a follow-up appointment with the hypertension clinic in 2-3 weeks.   Your physician recommends that you schedule a follow-up appointment with Dr. Delton See in 4 months    Any Other Special Instructions Will Be Listed Below (If Applicable).     If you need a refill on your cardiac medications before your next appointment, please call your pharmacy.

## 2017-04-25 NOTE — Progress Notes (Signed)
04/25/2017 Joseph Shannon   Jan 22, 1981  [id number]  Primary Physician Deborah Chalk, FNP Primary Cardiologist: Dr. Meda Coffee   Reason for Visit/CC: F/u for palpitations, HTN and Left Arm numbness.  HPI:  Joseph Shannon is a 36 y.o. male who is being seen today for 6 week f/u after being seen last month for chest pain/ palpitations, left arm numbness and HTN.   His PMH is notable for poorly controlled HTN, asthma,  H/o TIA, OSA not 100% compliant with CPAP, systolic HF, family h/o CAD (mom MI in early 36s, cousin with advanced HF). Per chart review, he had a NST in 2014 that was negative for ischemia but with mildly decreased LV function. He was placed on medical therapy. Repeat echo in 2016 showed normalization of EF back to normal. He has remained on a lot of cardiac meds>>Lasix, hydralazine, Imdur, Lisinopril, Bystolic and Lipitor. He was  seen by Dr. Meda Coffee on 07/24/16 and complained of exertional CP and dyspnea. She ordered a coronary CTA with calcium score. This showed no evidence of CAD. Calcium score was 0. There was however evidence of PNA. Dr. Meda Coffee placed him on a Zpack. Had improvement in symptoms.  Most recently, he was seen by Melina Copa, PA-C on 03/14/17 with complaint of chest tightness with associated palpitations. He also noted an episode of left arm numbness around this time. Similar to previous TIA. BP was also elevated after he had ran out of lasix 3 weeks prior. Lasix was reordered. He was ordered to get basic labs, a brain MRI and 30 day monitor. Labs and brain MRI were unremarkable. Normal MRI appearance of the brain. 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 MRA circle of Willis is otherwise normal without significant proximal stenosis, aneurysm, or branch vessel occlusion. Monitor results have not returned yet. Pt just returned monitor to office today for processing.     Current Meds  Medication Sig  . albuterol (PROVENTIL HFA;VENTOLIN  HFA) 108 (90 BASE) MCG/ACT inhaler Inhale 2 puffs into the lungs daily.   Marland Kitchen albuterol (PROVENTIL) (2.5 MG/3ML) 0.083% nebulizer solution Take 2.5 mg by nebulization every 4 (four) hours as needed for wheezing or shortness of breath.  Marland Kitchen aspirin EC 81 MG tablet Take 1 tablet (81 mg total) daily by mouth.  Marland Kitchen atorvastatin (LIPITOR) 40 MG tablet Take 40 mg by mouth daily.  . fluticasone (FLOVENT HFA) 110 MCG/ACT inhaler 3 puffs twice daily  . furosemide (LASIX) 40 MG tablet Take 1 tablet (40 mg total) 2 (two) times daily by mouth. Take 40 mg at 8 am and 40 mg at 2 pm.  . gabapentin (NEURONTIN) 300 MG capsule Take 1 capsule (300 mg total) by mouth 3 (three) times daily.  Marland Kitchen glucose monitoring kit (FREESTYLE) monitoring kit 1 each by Does not apply route as needed for other. Dispense any model that is covered- dispense testing supplies for Q AC/ HS accuchecks- 1 month supply with one refil.  . isosorbide mononitrate (IMDUR) 60 MG 24 hr tablet Take 1 tablet (60 mg total) daily by mouth.  Marland Kitchen lisinopril (PRINIVIL,ZESTRIL) 40 MG tablet Take 1 tablet (40 mg total) by mouth daily.  . metFORMIN (GLUCOPHAGE) 500 MG tablet Take 1 tablet (500 mg total) by mouth 2 (two) times daily with a meal.  . Nebivolol HCl 20 MG TABS TAKE ONE TABLET BY MOUTH DAILY AT BEDTIME  . nitroGLYCERIN (NITROSTAT) 0.4 MG SL tablet Place 1 tablet (0.4 mg total) under the tongue every 5 (  five) minutes as needed for chest pain.  Marland Kitchen omeprazole (PRILOSEC) 20 MG capsule Take 1 capsule (20 mg total) by mouth daily.  . [DISCONTINUED] hydrALAZINE (APRESOLINE) 25 MG tablet Take 3 tablets by mouth at breakfast, 3 tablets by mouth at lunch and 3 tablets by mouth at dinner to total 225 mg   Allergies  Allergen Reactions  . Naproxen Sodium Nausea And Vomiting   Past Medical History:  Diagnosis Date  . Asthma   . Chronic combined systolic and diastolic CHF (congestive heart failure) ([place])   . Diabetes mellitus without complication ([place])   .  Hypertension   . NICM (nonischemic cardiomyopathy) ([place])   . Normal coronary arteries   . OSA (obstructive sleep apnea)   . TIA (transient ischemic attack) 2014   Family History  Problem Relation Age of Onset  . Hypertension Mother   . Heart disease Mother   . Hypertension Father   . Heart disease Father   . Heart disease Paternal Uncle   . Heart disease Maternal Grandmother    Past Surgical History:  Procedure Laterality Date  . NO PAST SURGERIES     Social History   Socioeconomic History  . Marital status: Single    Spouse name: Not on file  . Number of children: Not on file  . Years of education: Not on file  . Highest education level: Not on file  Social Needs  . Financial resource strain: Not on file  . Food insecurity - worry: Not on file  . Food insecurity - inability: Not on file  . Transportation needs - medical: Not on file  . Transportation needs - non-medical: Not on file  Occupational History  . Not on file  Tobacco Use  . Smoking status: Current Every Day Smoker    Packs/day: 0.25    Types: Cigarettes  . Smokeless tobacco: Never Used  Substance and Sexual Activity  . Alcohol use: Yes    Comment: 3 beers and a shot per day- last use yesterday  . Drug use: No  . Sexual activity: Not on file  Other Topics Concern  . Not on file  Social History Narrative  . Not on file     Review of Systems: General: negative for chills, fever, night sweats or weight changes.  Cardiovascular: negative for chest pain, dyspnea on exertion, edema, orthopnea, palpitations, paroxysmal nocturnal dyspnea or shortness of breath Dermatological: negative for rash Respiratory: negative for cough or wheezing Urologic: negative for hematuria Abdominal: negative for nausea, vomiting, diarrhea, bright red blood per rectum, melena, or hematemesis Neurologic: negative for visual changes, syncope, or dizziness All other systems reviewed and are otherwise negative except as noted  above.   Physical Exam:  Blood pressure (!) 152/100, pulse 82, height _0  (1.905 m), weight 231 lb (104.8 kg), SpO2 97 %.  General appearance: alert, cooperative and no distress Neck: no carotid bruit and no JVD Lungs: clear to auscultation bilaterally Heart: regular rate and rhythm, S1, S2 normal, no murmur, click, rub or gallop Extremities: extremities normal, atraumatic, no cyanosis or edema Pulses: 2+ and symmetric Skin: Skin color, texture, turgor normal. No rashes or lesions Neurologic: Grossly normal  EKG not performed  -- personally reviewed   ASSESSMENT AND PLAN:   1. Transient Left Arm Numbness: resolved. Was noted at last OV 02/2017. He has prior h/o TIA. A brain MRI was ordered by our group and performed 03/14/17 and was negative.   2. HTN: remains elevated at 152/100. He has  long h/o poorly controlled HTN. He reports full med compliance. He denies tobacco and ETOH. He has been cutting down on salt. Moderate caffeine intake (advised to reduce). He does not get regular physical activity. Pt encouraged to increase. We will further titrate his hydralazine to 100 mg TID. F/u in HTN clinic in 2-3 weeks.     3. Palpitations/CP: currently asymptomatic. 30 day monitor was just returned today. Results pending. Dr. Meda Coffee to read. We will further advise once results return.    Follow-Up in HTN clinic in 2-3 weeks. Dr. Meda Coffee in 3-4 months.   Dameian Crisman Ladoris Gene, M[facility] 04/25/2017 4:16 PM

## 2017-05-20 ENCOUNTER — Ambulatory Visit (INDEPENDENT_AMBULATORY_CARE_PROVIDER_SITE_OTHER): Payer: BLUE CROSS/BLUE SHIELD | Admitting: Pharmacist

## 2017-05-20 VITALS — BP 144/102 | HR 77

## 2017-05-20 DIAGNOSIS — I10 Essential (primary) hypertension: Secondary | ICD-10-CM | POA: Diagnosis not present

## 2017-05-20 MED ORDER — SPIRONOLACTONE 25 MG PO TABS: 12.5000 mg | ORAL_TABLET | Freq: Every day | ORAL | 3 refills | Status: DC

## 2017-05-20 NOTE — Progress Notes (Signed)
Patient ID: Joseph Shannon                 DOB: [birth year]                      MRN: [id number]     HPI: Joseph Shannon is a 37 y.o. male patient of Dr. Meda Coffee who presents today for hypertension evaluation. PMH significant for TIA 2014, poorly controlled HTN (renal duplex normal 2015), asthma, chronic combined CHF (previous EF minimally decreased in 2014), OSA previously not 100% compliant with CPAP, family history of CAD (mom MI in early 77s, cousin died at 107 of MI, cousin with advanced HF), diabetes mellitus with neuropathy, and hyperlipidemia. At his most recent visit with Ellen Henri, PA his hydralazine was titrated to 142m TID.   He presents today for additional BP management with his son. He feels that he is somewhat lightheaded with increase in dose of hydralazine only in the mornings, but his pressure has been in the normal/elevated range. He does not believe that his SOB is associated with increase dose of hydralazine, but he has been using his inhaler frequently with the cold weather. He denies chest pains.   Unsure if should be taking asa - per Dr. HYong Channeldoes not need? Need Flovent from Dr. HYong Channelas well.   Current HTN meds:  Imdur 680mdaily  Lisinopril 4082maily in the evening nebivolol 97m17mily at bedtime Furosemide 40mg47m Hydralazine 100mg 61m(530am, 330pm, 8-9pm)  Previously tried: amlodipine   BP goal: <130/80  Family History: Mother (HTN, heart disease, MI in 50s), 7sher (HTN, Heart disease), cousin died at 35 of 53, cousin with advanced HF, sister with DM  Social History: current smoker (2-3 cigarettes per day) he would like to try to quit with the patches, drinks beer on weekend watching football   Diet: Uses Mrs. DASH. Most meals are prepared from home. 12 ounces of coffee (usually drinks 1/2 of this), Avoids soda, he does drink a lot of water. He occasionally drinks juice.   Exercise: No formal exercise. He is very active at work working on  [employer] BP readings: 149/89 this morning - cuff at work, 192/90 he believes last Friday, wrist cuff at home  Wt Readings from Last 3 Encounters:  04/25/17 231 lb (104.8 kg)  03/14/17 231 lb 12.8 oz (105.1 kg)  08/23/16 230 lb (104.3 kg)   BP Readings from Last 3 Encounters:  05/20/17 (!) 144/102  04/25/17 (!) 152/100  03/14/17 (!) 156/92   Pulse Readings from Last 3 Encounters:  05/20/17 77  04/25/17 82  03/14/17 85    Renal function: CrCl cannot be calculated (Patient's most recent lab result is older than the maximum 21 days allowed.).  Past Medical History:  Diagnosis Date  . Asthma   . Chronic combined systolic and diastolic CHF (congestive heart failure) (HCC)  [place]abetes mellitus without complication (HCC)  [place]   . NICM (nonischemic cardiomyopathy) (HCC)  [place] coronary arteries   . OSA (obstructive sleep apnea)   . TIA (transient ischemic attack) 2014    Current Outpatient Medications on File Prior to Visit  Medication Sig Dispense Refill  . albuterol (PROVENTIL HFA;VENTOLIN HFA) 108 (90 BASE) MCG/ACT inhaler Inhale 2 puffs into the lungs daily.     . albuMarland Kitchenerol (PROVENTIL) (2.5 MG/3ML) 0.083% nebulizer solution Take 2.5 mg by nebulization every 4 (four) hours as needed for wheezing or shortness  of breath.    Marland Kitchen atorvastatin (LIPITOR) 40 MG tablet Take 40 mg by mouth daily.    . fluticasone (FLOVENT HFA) 110 MCG/ACT inhaler 3 puffs twice daily 1 Inhaler 12  . furosemide (LASIX) 40 MG tablet Take 1 tablet (40 mg total) 2 (two) times daily by mouth. Take 40 mg at 8 am and 40 mg at 2 pm. 180 tablet 1  . gabapentin (NEURONTIN) 300 MG capsule Take 1 capsule (300 mg total) by mouth 3 (three) times daily. 90 capsule 8  . hydrALAZINE (APRESOLINE) 100 MG tablet Take 1 tablet (100 mg total) by mouth 3 (three) times daily. 270 tablet 3  . isosorbide mononitrate (IMDUR) 60 MG 24 hr tablet Take 1 tablet (60 mg total) daily by mouth. 90 tablet 1    . lisinopril (PRINIVIL,ZESTRIL) 40 MG tablet Take 1 tablet (40 mg total) by mouth daily. 90 tablet 6  . metFORMIN (GLUCOPHAGE) 500 MG tablet Take 1 tablet (500 mg total) by mouth 2 (two) times daily with a meal. 180 tablet 3  . Nebivolol HCl 20 MG TABS TAKE ONE TABLET BY MOUTH DAILY AT BEDTIME 90 tablet 1  . omeprazole (PRILOSEC) 20 MG capsule Take 1 capsule (20 mg total) by mouth daily. 30 capsule 11  . aspirin EC 81 MG tablet Take 1 tablet (81 mg total) daily by mouth. (Patient not taking: Reported on 05/20/2017) 90 tablet 3  . glucose monitoring kit (FREESTYLE) monitoring kit 1 each by Does not apply route as needed for other. Dispense any model that is covered- dispense testing supplies for Q AC/ HS accuchecks- 1 month supply with one refil. (Patient not taking: Reported on 05/20/2017) 1 each 1  . nitroGLYCERIN (NITROSTAT) 0.4 MG SL tablet Place 1 tablet (0.4 mg total) under the tongue every 5 (five) minutes as needed for chest pain. (Patient not taking: Reported on 05/20/2017) 25 tablet 3   No current facility-administered medications on file prior to visit.     Allergies  Allergen Reactions  . Naproxen Sodium Nausea And Vomiting    Blood pressure (!) 144/102, pulse 77.   Assessment/Plan: Hypertension: BP today is not at goal. Discussed that his body may not be used to running closer to normal so this may feel low to him. Advised he be cautious with quick movements, especially at work. Discussed option of titrating Bystolic vs starting spirolactone. Pt is willing to start spironolactone 12.76m daily. Repeat BMET in 1 week. Follow up in HTN clinic in 2 weeks.    Thank you, KLelan Pons APatterson Hammersmith [facility]  05/20/2017 4:45 PM

## 2017-05-20 NOTE — Patient Instructions (Addendum)
Return for a follow up appointment in 1 week for blood work  Our blood pressure goal is less than 130/80  Check your blood pressure at home daily (if able) and keep record of the readings.  Take your BP meds as follows: START Spironolactone 12.5mg  (1/2 tablet) daily   Bring all of your meds, your BP cuff and your record of home blood pressures to your next appointment.  Exercise as you're able, try to walk approximately 30 minutes per day.  Keep salt intake to a minimum, especially watch canned and prepared boxed foods.  Eat more fresh fruits and vegetables and fewer canned items.  Avoid eating in fast food restaurants.    HOW TO TAKE YOUR BLOOD PRESSURE: . Rest 5 minutes before taking your blood pressure. .  Don't smoke or drink caffeinated beverages for at least 30 minutes before. . Take your blood pressure before (not after) you eat. . Sit comfortably with your back supported and both feet on the floor (don't cross your legs). . Elevate your arm to heart level on a table or a desk. . Use the proper sized cuff. It should fit smoothly and snugly around your bare upper arm. There should be enough room to slip a fingertip under the cuff. The bottom edge of the cuff should be 1 inch above the crease of the elbow. . Ideally, take 3 measurements at one sitting and record the average.

## 2017-05-22 DIAGNOSIS — E119 Type 2 diabetes mellitus without complications: Secondary | ICD-10-CM | POA: Diagnosis not present

## 2017-05-27 ENCOUNTER — Other Ambulatory Visit: Payer: BLUE CROSS/BLUE SHIELD

## 2017-06-04 ENCOUNTER — Other Ambulatory Visit: Payer: BLUE CROSS/BLUE SHIELD

## 2017-06-04 ENCOUNTER — Encounter: Payer: Self-pay | Admitting: Pharmacist

## 2017-06-04 ENCOUNTER — Ambulatory Visit (INDEPENDENT_AMBULATORY_CARE_PROVIDER_SITE_OTHER): Payer: BLUE CROSS/BLUE SHIELD | Admitting: Pharmacist

## 2017-06-04 VITALS — BP 138/102 | HR 89

## 2017-06-04 DIAGNOSIS — I10 Essential (primary) hypertension: Secondary | ICD-10-CM

## 2017-06-04 NOTE — Progress Notes (Signed)
Patient ID: Joseph Shannon                 DOB: 08-22-1980                      MRN: [id number]     HPI: Paulette Lynch is a 37 y.o. male patient of Dr. Meda Coffee who presents today for hypertension evaluation. PMH significant for TIA 2014, poorly controlled HTN (renal duplex normal 2015), asthma, chronic combined CHF (previous EF minimally decreased in 2014), OSA previously not 100% compliant with CPAP, family history of CAD (mom MI in early 72s, cousin died at 87 of MI, cousin with advanced HF), diabetes mellitus with neuropathy, and hyperlipidemia. At his last visit in HTN clinic he was started on spironolactone 33m daily. He No Showed his BMET 1 week later. Will obtain BMET today.   He presents today for follow up. He denies chest pain, dizziness, and SOB. He is doing well overall. He was concerned because pharmacist at pharmacy advised him that there could be a dangerous interaction with spironolactone and lisinopril. We discussed the risk is due to elevated potassium and this could lead to an arrhythmia. Advised that is why we had wanted him to return for blood work one week after starting the medication. He states he did start the medication the day after our visit.   Current HTN meds:  Imdur 623mdaily  Lisinopril 4051maily in the evening nebivolol 5m26mily at bedtime Furosemide 40mg53m Hydralazine 100mg 62m(530am, 330pm, 8-9pm) Spironolactone 25mg d24m in the morning  Previously tried: amlodipine - stopped by Dr. Nelson Meda Coffeetarted with beta blocker  BP goal: <130/80  Family History: Mother (HTN, heart disease, MI in 50s), F30ser (HTN, Heart disease), cousin died at 35 of M48 cousin with advanced HF, sister with DM  Social History: current smoker (2-3 cigarettes per day) he would like to try to quit with the patches, drinks beer on weekend watching football   Diet: Uses Mrs. DASH. Most meals are prepared from home. 12 ounces of coffee (usually drinks 1/2 of this), Avoids  soda, he does drink a lot of water. He occasionally drinks juice.   Exercise: No formal exercise. He is very active at work working on [employer] BP readings: has not checked  Wt Readings from Last 3 Encounters:  04/25/17 231 lb (104.8 kg)  03/14/17 231 lb 12.8 oz (105.1 kg)  08/23/16 230 lb (104.3 kg)   BP Readings from Last 3 Encounters:  06/04/17 (!) 138/102  05/20/17 (!) 144/102  04/25/17 (!) 152/100   Pulse Readings from Last 3 Encounters:  06/04/17 89  05/20/17 77  04/25/17 82    Renal function: CrCl cannot be calculated (Unknown ideal weight.).  Past Medical History:  Diagnosis Date  . Asthma   . Chronic combined systolic and diastolic CHF (congestive heart failure) (HCC)   [place]abetes mellitus without complication (HCC)   [place]   . NICM (nonischemic cardiomyopathy) (HCC)   [place] coronary arteries   . OSA (obstructive sleep apnea)   . TIA (transient ischemic attack) 2014    Current Outpatient Medications on File Prior to Visit  Medication Sig Dispense Refill  . albuterol (PROVENTIL HFA;VENTOLIN HFA) 108 (90 BASE) MCG/ACT inhaler Inhale 2 puffs into the lungs daily.     . albutMarland Kitchenrol (PROVENTIL) (2.5 MG/3ML) 0.083% nebulizer solution Take 2.5 mg by nebulization every 4 (four) hours as needed for wheezing or shortness of  breath.    Marland Kitchen aspirin EC 81 MG tablet Take 1 tablet (81 mg total) daily by mouth. (Patient not taking: Reported on 05/20/2017) 90 tablet 3  . atorvastatin (LIPITOR) 40 MG tablet Take 40 mg by mouth daily.    . fluticasone (FLOVENT HFA) 110 MCG/ACT inhaler 3 puffs twice daily 1 Inhaler 12  . furosemide (LASIX) 40 MG tablet Take 1 tablet (40 mg total) 2 (two) times daily by mouth. Take 40 mg at 8 am and 40 mg at 2 pm. 180 tablet 1  . gabapentin (NEURONTIN) 300 MG capsule Take 1 capsule (300 mg total) by mouth 3 (three) times daily. 90 capsule 8  . glucose monitoring kit (FREESTYLE) monitoring kit 1 each by Does not apply route  as needed for other. Dispense any model that is covered- dispense testing supplies for Q AC/ HS accuchecks- 1 month supply with one refil. (Patient not taking: Reported on 05/20/2017) 1 each 1  . hydrALAZINE (APRESOLINE) 100 MG tablet Take 1 tablet (100 mg total) by mouth 3 (three) times daily. 270 tablet 3  . isosorbide mononitrate (IMDUR) 60 MG 24 hr tablet Take 1 tablet (60 mg total) daily by mouth. 90 tablet 1  . lisinopril (PRINIVIL,ZESTRIL) 40 MG tablet Take 1 tablet (40 mg total) by mouth daily. 90 tablet 6  . metFORMIN (GLUCOPHAGE) 500 MG tablet Take 1 tablet (500 mg total) by mouth 2 (two) times daily with a meal. 180 tablet 3  . Nebivolol HCl 20 MG TABS TAKE ONE TABLET BY MOUTH DAILY AT BEDTIME 90 tablet 1  . nitroGLYCERIN (NITROSTAT) 0.4 MG SL tablet Place 1 tablet (0.4 mg total) under the tongue every 5 (five) minutes as needed for chest pain. (Patient not taking: Reported on 05/20/2017) 25 tablet 3  . omeprazole (PRILOSEC) 20 MG capsule Take 1 capsule (20 mg total) by mouth daily. 30 capsule 11  . spironolactone (ALDACTONE) 25 MG tablet Take 0.5 tablets (12.5 mg total) by mouth daily. 15 tablet 3   No current facility-administered medications on file prior to visit.     Allergies  Allergen Reactions  . Naproxen Sodium Nausea And Vomiting    Blood pressure (!) 138/102, pulse 89.   Assessment/Plan: Hypertension: BMET today which returned WNL. Will increase spironolactone to 38m daily and repeat BMET in 1 week and follow up in HTN clinic in 2 weeks. Lab results sent to Dr. HYong Channelas requested by patient.     Sent lab results to Dr. MAntonieta Pert- Fax number [phone] Thank you, KLelan Pons APatterson Hammersmith [facility]  06/05/2017 9:44 AM

## 2017-06-04 NOTE — Patient Instructions (Addendum)
Return for a follow up appointment in 2-3 weeks  Check your blood pressure at home daily (if able) and keep record of the readings.  Take your BP meds as follows: We will call you with results and how to change the medication and when to follow up  Bring all of your meds, your BP cuff and your record of home blood pressures to your next appointment.  Exercise as you're able, try to walk approximately 30 minutes per day.  Keep salt intake to a minimum, especially watch canned and prepared boxed foods.  Eat more fresh fruits and vegetables and fewer canned items.  Avoid eating in fast food restaurants.    HOW TO TAKE YOUR BLOOD PRESSURE: . Rest 5 minutes before taking your blood pressure. .  Don't smoke or drink caffeinated beverages for at least 30 minutes before. . Take your blood pressure before (not after) you eat. . Sit comfortably with your back supported and both feet on the floor (don't cross your legs). . Elevate your arm to heart level on a table or a desk. . Use the proper sized cuff. It should fit smoothly and snugly around your bare upper arm. There should be enough room to slip a fingertip under the cuff. The bottom edge of the cuff should be 1 inch above the crease of the elbow. . Ideally, take 3 measurements at one sitting and record the average.

## 2017-06-05 LAB — BASIC METABOLIC PANEL
BUN/Creatinine Ratio: 15 (ref 9–20)
BUN: 15 mg/dL (ref 6–20)
CO2: 23 mmol/L (ref 20–29)
Calcium: 9.5 mg/dL (ref 8.7–10.2)
Chloride: 106 mmol/L (ref 96–106)
Creatinine, Ser: 1.03 mg/dL (ref 0.76–1.27)
GFR calc Af Amer: 108 mL/min/{1.73_m2} (ref >59)
GFR calc non Af Amer: 93 mL/min/{1.73_m2} (ref >59)
Glucose: 96 mg/dL (ref 65–99)
Potassium: 4.5 mmol/L (ref 3.5–5.2)
Sodium: 144 mmol/L (ref 134–144)

## 2017-06-05 MED ORDER — SPIRONOLACTONE 25 MG PO TABS: 25.0000 mg | ORAL_TABLET | Freq: Every day | ORAL | 3 refills | Status: DC

## 2017-06-12 ENCOUNTER — Other Ambulatory Visit: Payer: BLUE CROSS/BLUE SHIELD | Admitting: *Deleted

## 2017-06-12 DIAGNOSIS — I10 Essential (primary) hypertension: Secondary | ICD-10-CM

## 2017-06-12 NOTE — Telephone Encounter (Signed)
This encounter was created in error - please disregard.

## 2017-06-13 LAB — BASIC METABOLIC PANEL
BUN/Creatinine Ratio: 23 — ABNORMAL HIGH (ref 9–20)
BUN: 24 mg/dL — ABNORMAL HIGH (ref 6–20)
CO2: 22 mmol/L (ref 20–29)
Calcium: 9.4 mg/dL (ref 8.7–10.2)
Chloride: 107 mmol/L — ABNORMAL HIGH (ref 96–106)
Creatinine, Ser: 1.04 mg/dL (ref 0.76–1.27)
GFR calc Af Amer: 106 mL/min/{1.73_m2} (ref >59)
GFR calc non Af Amer: 92 mL/min/{1.73_m2} (ref >59)
Glucose: 107 mg/dL — ABNORMAL HIGH (ref 65–99)
Potassium: 4.3 mmol/L (ref 3.5–5.2)
Sodium: 143 mmol/L (ref 134–144)

## 2017-06-18 ENCOUNTER — Encounter: Payer: Self-pay | Admitting: Pharmacist

## 2017-06-18 ENCOUNTER — Ambulatory Visit (INDEPENDENT_AMBULATORY_CARE_PROVIDER_SITE_OTHER): Payer: BLUE CROSS/BLUE SHIELD | Admitting: Pharmacist

## 2017-06-18 VITALS — BP 144/98 | HR 83

## 2017-06-18 DIAGNOSIS — I10 Essential (primary) hypertension: Secondary | ICD-10-CM

## 2017-06-18 MED ORDER — SPIRONOLACTONE 50 MG PO TABS: 50.0000 mg | ORAL_TABLET | Freq: Every day | ORAL | 3 refills | Status: DC

## 2017-06-18 MED ORDER — HYDRALAZINE HCL 100 MG PO TABS: 100.0000 mg | ORAL_TABLET | Freq: Three times a day (TID) | ORAL | 3 refills | Status: DC

## 2017-06-18 NOTE — Progress Notes (Signed)
Patient ID: Joseph Shannon                 DOB: 1981-03-06                      MRN: [id number]     HPI: Joseph Shannon is a 37 y.o. male patient of Dr. Meda Coffee who presents today for hypertension evaluation. PMH significant for TIA 2014, poorly controlled HTN (renal duplex normal 2015), asthma, chronic combined CHF (previous EF minimally decreased in 2014), OSA previously not 100% compliant with CPAP, family history of CAD (mom MI in early 41s, cousin died at 38 of MI, cousin with advanced HF), diabetes mellitus with neuropathy, and hyperlipidemia. At his last visit in HTN clinic he was started on spironolactone 72m daily. BMET returned WNL. At his most recent visit in HTN clinic no medication changes were made. At his last visit his spironolactone was increased to 293mdaily.   He presents today for follow up. He reports that he has been having rapid heart beats where he can feel his heart in his ears. He also reports that he has been having chest pressure. Which has been going and off since last week. He is not having any of these symptoms today. He states he was dizzy for a while on Sunday. He believes he was well hydrated. He did have a headache when he felt his pressure in his ears. He has been using nebulizer recently.   Current HTN meds:  Imdur 6015maily  Lisinopril 58m73mily in the evening nebivolol 20mg70mly at bedtime  Furosemide 58mg 88mHydralazine 100mg T32m530am, 330pm, 8-9pm) Spironolactone 25mg da47min the morning  Previously tried: amlodipine - stopped by Dr. Nelson wMeda Coffeearted with beta blocker  BP goal: <130/80  Family History: Mother (HTN, heart disease, MI in 50s), Fa72sr (HTN, Heart disease), cousin died at 35 of MI53cousin with advanced HF, sister with DM  Social History: current smoker (2-3 cigarettes per day) he would like to try to quit with the patches, drinks beer on weekend watching football   Diet: Uses Mrs. DASH. Most meals are prepared from home.  12 ounces of coffee (usually drinks 1/2 of this), avoids soda, he does drink a lot of water. He occasionally drinks juice.   Exercise: No formal exercise. He is very active at work working on [employer] BP readings: has not checked since our last visit.   Wt Readings from Last 3 Encounters:  04/25/17 231 lb (104.8 kg)  03/14/17 231 lb 12.8 oz (105.1 kg)  08/23/16 230 lb (104.3 kg)   BP Readings from Last 3 Encounters:  06/18/17 (!) 144/98  06/04/17 (!) 138/102  05/20/17 (!) 144/102   Pulse Readings from Last 3 Encounters:  06/18/17 83  06/04/17 89  05/20/17 77    Renal function: CrCl cannot be calculated (Unknown ideal weight.).  Past Medical History:  Diagnosis Date  . Asthma   . Chronic combined systolic and diastolic CHF (congestive heart failure) (HCC)   .[place] mellitus without complication (HCC)   .[place]ension   . NICM (nonischemic cardiomyopathy) (HCC)   .[place] coronary arteries   . OSA (obstructive sleep apnea)   . TIA (transient ischemic attack) 2014    Current Outpatient Medications on File Prior to Visit  Medication Sig Dispense Refill  . albuterol (PROVENTIL HFA;VENTOLIN HFA) 108 (90 BASE) MCG/ACT inhaler Inhale 2 puffs into the lungs daily.     .Marland Kitchen  albuterol (PROVENTIL) (2.5 MG/3ML) 0.083% nebulizer solution Take 2.5 mg by nebulization every 4 (four) hours as needed for wheezing or shortness of breath.    Marland Kitchen atorvastatin (LIPITOR) 40 MG tablet Take 40 mg by mouth daily.    . fluticasone (FLOVENT HFA) 110 MCG/ACT inhaler 3 puffs twice daily 1 Inhaler 12  . furosemide (LASIX) 40 MG tablet Take 1 tablet (40 mg total) 2 (two) times daily by mouth. Take 40 mg at 8 am and 40 mg at 2 pm. 180 tablet 1  . gabapentin (NEURONTIN) 300 MG capsule Take 1 capsule (300 mg total) by mouth 3 (three) times daily. (Patient taking differently: Take 300 mg by mouth 2 (two) times daily. ) 90 capsule 8  . glucose monitoring kit (FREESTYLE) monitoring kit 1 each  by Does not apply route as needed for other. Dispense any model that is covered- dispense testing supplies for Q AC/ HS accuchecks- 1 month supply with one refil. 1 each 1  . isosorbide mononitrate (IMDUR) 60 MG 24 hr tablet Take 1 tablet (60 mg total) daily by mouth. 90 tablet 1  . lisinopril (PRINIVIL,ZESTRIL) 40 MG tablet Take 1 tablet (40 mg total) by mouth daily. 90 tablet 6  . metFORMIN (GLUCOPHAGE) 500 MG tablet Take 1 tablet (500 mg total) by mouth 2 (two) times daily with a meal. 180 tablet 3  . Nebivolol HCl 20 MG TABS TAKE ONE TABLET BY MOUTH DAILY AT BEDTIME 90 tablet 1  . omeprazole (PRILOSEC) 20 MG capsule Take 1 capsule (20 mg total) by mouth daily. 30 capsule 11  . aspirin EC 81 MG tablet Take 1 tablet (81 mg total) daily by mouth. (Patient not taking: Reported on 05/20/2017) 90 tablet 3  . nitroGLYCERIN (NITROSTAT) 0.4 MG SL tablet Place 1 tablet (0.4 mg total) under the tongue every 5 (five) minutes as needed for chest pain. (Patient not taking: Reported on 05/20/2017) 25 tablet 3   No current facility-administered medications on file prior to visit.     Allergies  Allergen Reactions  . Naproxen Sodium Nausea And Vomiting    Blood pressure (!) 144/98, pulse 83.   Assessment/Plan: Hypertension:  BP today is above goal. Will increase spironolactone to 51m daily. Repeat BMET in 1 week. Follow up with Dr. NFidel Levyin 1-2 weeks for evaluation of chest pain as above (no active chest pain today). Have asked he try to monitor pressures occasionally and bring log with him to follow up.   Smoking cessation: Currently smoking 2-3 cigarettes daily. He is still not ready to quit.    Thank you, KLelan Pons APatterson Hammersmith [facility]  06/18/2017 4:11 PM

## 2017-06-18 NOTE — Patient Instructions (Signed)
Return for a follow up appointment in 1 week for blood work and 2 weeks for visit with PA.   Your blood pressure goal is less than 130/80  Check your blood pressure at home daily (if able) and keep record of the readings.  Take your BP meds as follows: INCREASE spironolactone to 50mg  daily ( you may take 2 tablets of your current supply until you complete then pick up higher strength and take 1 tablet (50mg ) daily)  Bring all of your meds, your BP cuff and your record of home blood pressures to your next appointment.  Exercise as you're able, try to walk approximately 30 minutes per day.  Keep salt intake to a minimum, especially watch canned and prepared boxed foods.  Eat more fresh fruits and vegetables and fewer canned items.  Avoid eating in fast food restaurants.    HOW TO TAKE YOUR BLOOD PRESSURE: . Rest 5 minutes before taking your blood pressure. .  Don't smoke or drink caffeinated beverages for at least 30 minutes before. . Take your blood pressure before (not after) you eat. . Sit comfortably with your back supported and both feet on the floor (don't cross your legs). . Elevate your arm to heart level on a table or a desk. . Use the proper sized cuff. It should fit smoothly and snugly around your bare upper arm. There should be enough room to slip a fingertip under the cuff. The bottom edge of the cuff should be 1 inch above the crease of the elbow. . Ideally, take 3 measurements at one sitting and record the average.

## 2017-06-20 ENCOUNTER — Encounter: Payer: Self-pay | Admitting: *Deleted

## 2017-06-25 ENCOUNTER — Ambulatory Visit: Payer: BLUE CROSS/BLUE SHIELD | Admitting: Cardiology

## 2017-06-25 ENCOUNTER — Other Ambulatory Visit: Payer: BLUE CROSS/BLUE SHIELD

## 2017-06-25 ENCOUNTER — Encounter: Payer: Self-pay | Admitting: Cardiology

## 2017-06-25 VITALS — BP 170/120 | HR 85 | Ht 75.0 in | Wt 231.0 lb

## 2017-06-25 DIAGNOSIS — I10 Essential (primary) hypertension: Secondary | ICD-10-CM

## 2017-06-25 DIAGNOSIS — I119 Hypertensive heart disease without heart failure: Secondary | ICD-10-CM

## 2017-06-25 DIAGNOSIS — I5033 Acute on chronic diastolic (congestive) heart failure: Secondary | ICD-10-CM

## 2017-06-25 DIAGNOSIS — R002 Palpitations: Secondary | ICD-10-CM

## 2017-06-25 DIAGNOSIS — R079 Chest pain, unspecified: Secondary | ICD-10-CM | POA: Diagnosis not present

## 2017-06-25 MED ORDER — ISOSORBIDE MONONITRATE ER 120 MG PO TB24: 120.0000 mg | ORAL_TABLET | Freq: Every day | ORAL | 2 refills | Status: DC

## 2017-06-25 NOTE — Patient Instructions (Addendum)
Medication Instructions:   INCREASE YOUR IMDUR TO 120 MG ONCE DAILY     Follow-Up:  2 WEEKS WITH BP CLINIC TO SEE OUR PHARMACIST       If you need a refill on your cardiac medications before your next appointment, please call your pharmacy.

## 2017-06-25 NOTE — Progress Notes (Signed)
Patient ID: Jemal Miskell, male   DOB: 10-18-1980, 37 y.o.   MRN: [id number]    Patient Name: Joseph Shannon Date of Encounter: 06/26/2017  Primary Care Provider:  Eather Colas, FNP Primary Cardiologist:  Tobias Alexander  Patient Profile  Uncontrolled HTN  Problem List   Past Medical History:  Diagnosis Date  . Asthma   . Chronic combined systolic and diastolic CHF (congestive heart failure) (HCC)   . Diabetes mellitus without complication (HCC)   . Hypertension   . NICM (nonischemic cardiomyopathy) (HCC)   . Normal coronary arteries   . OSA (obstructive sleep apnea)   . TIA (transient ischemic attack) 2014   Past Surgical History:  Procedure Laterality Date  . NO PAST SURGERIES      Allergies  Allergies  Allergen Reactions  . Naproxen Sodium Nausea And Vomiting    HPI  37 year-old gentleman with history of hypertension and asthma who was seen in the emergency department for complaints of cough and chest pain and left sided weakness.   In May 2014 the patient presented with intermittent h/a for 4 days with elevated BP, but progressed to central chest tight and episode of coughing, BP elevated at 172/113. Cardiopulm exam benign. Neuro exam benign. EKG unchanged from prior. Pt admitted 5/13 for TIA/HTN, found to have possible apical hypokinesis, and benign appearing pineal cyst. He was started on lopressor & ACE inhibitor, asked to f/u with PCP. He has not yet been able to establish with one and has not been able to have stress testing as directed. CXR unremarkable, trop neg x2. Pt's h/a, CP, and BP improved after 1 SL NTG.   05/18/2013 The patient is coming here today and he states that he has just been almost daily, retrosternal pressure-like nonexertional but sometimes they feel like they're relieved by rest, he hasn't tried to use nitroglycerin with it.  The patient is very concerned as his mom with an early 59s just had a major myocardial infarction. His cousin  just had a left ventricular assist device placed heart failure, the patient is not aware of etiology. The patient is asymptomatic. He brings a BP diary, his BP is consistently elevated with values in 150-190 mmHg range.  Imdur 30 mg po daily and hydralazine 25 mg po TID was prescribed.  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 He reports that he continues to have exertional retrosternal pressure-like chest pain that's radiating to his neck and left arm. He feels tingling in his left arm on exertion and also night. He denies dizziness palpitations or syncope. He is compliant with his medications. His atorvastatin was most recently increased from 20-40 mg daily. He is very anxious about his chest pain as his first degree cousin died at age of 27 for myocardial infarction, and his grandfather just had another heart attack last Friday.  06/25/17 - 2 week follow up, he was seen by our pharmacist 3 x in the last 2 months and spironolactone was uptitrated to 50 mg po daily. His BP continues to run in 160'.  Denies any other symptoms such as chest pain, DOE, LE edema, orthopnea, PND.  Home Medications  Prior to Admission medications   Medication Sig Start Date End Date Taking? Authorizing Provider  albuterol (PROVENTIL HFA;VENTOLIN HFA) 108 (90 BASE) MCG/ACT inhaler Inhale 2 puffs into the lungs daily.    Yes Historical Provider, MD  albuterol (PROVENTIL) (2.5 MG/3ML)  0.083% nebulizer solution Take 2.5 mg by nebulization every 6 (six) hours as needed for wheezing.   Yes Historical Provider, MD  fluticasone (FLOVENT HFA) 110 MCG/ACT inhaler Inhale 1 puff into the lungs 2 (two) times daily as needed (for wheezing).    Yes Historical Provider, MD  lisinopril-hydrochlorothiazide (PRINZIDE,ZESTORETIC) 20-25 MG per tablet Take 2 tablets by mouth daily. 02/04/13  Yes Alison Murray, MD  metoprolol tartrate (LOPRESSOR) 25 MG tablet Take 1 tablet (25 mg  total) by mouth 2 (two) times daily. 02/04/13  Yes Alison Murray, MD    Family History  Family History  Problem Relation Age of Onset  . Hypertension Mother   . Heart disease Mother   . Hypertension Father   . Heart disease Father   . Heart disease Paternal Uncle   . Heart disease Maternal Grandmother     Social History  Social History   Socioeconomic History  . Marital status: Single    Spouse name: Not on file  . Number of children: Not on file  . Years of education: Not on file  . Highest education level: Not on file  Social Needs  . Financial resource strain: Not on file  . Food insecurity - worry: Not on file  . Food insecurity - inability: Not on file  . Transportation needs - medical: Not on file  . Transportation needs - non-medical: Not on file  Occupational History  . Not on file  Tobacco Use  . Smoking status: Former Smoker    Packs/day: 0.25    Types: Cigarettes    Last attempt to quit: 06/18/2017    Years since quitting: 0.0  . Smokeless tobacco: Never Used  Substance and Sexual Activity  . Alcohol use: Yes    Comment: 3 beers and a shot per day- last use yesterday  . Drug use: No  . Sexual activity: Not on file  Other Topics Concern  . Not on file  Social History Narrative  . Not on file     Review of Systems, as per history of present illness General:  No chills, fever, night sweats or weight changes.  Cardiovascular:  No chest pain, dyspnea on exertion, edema, orthopnea, palpitations, paroxysmal nocturnal dyspnea. Dermatological: No rash, lesions/masses Respiratory: No cough, dyspnea Urologic: No hematuria, dysuria Abdominal:   No nausea, vomiting, diarrhea, bright red blood per rectum, melena, or hematemesis Neurologic:  No visual changes, wkns, changes in mental status. All other systems reviewed and are otherwise negative except as noted above.  Physical Exam  General: Pleasant, NAD Psych: Normal affect. Neuro: Alert and oriented X 3.  Moves all extremities spontaneously. HEENT: Normal  Neck: Supple without bruits or JVD. Lungs:  Resp regular and unlabored, CTA. Heart: RRR no s3, s4, or murmurs. Abdomen: Soft, non-tender, non-distended, BS + x 4.  Extremities: No clubbing, cyanosis, trace LE edema. DP/PT/Radials 2+ and equal bilaterally.  Accessory Clinical Findings  ECG - sinus rhythm 88 beats per minute, normal EKG  Lipid Panel     Component Value Date/Time   CHOL 141 07/24/2016 0922   TRIG 77 07/24/2016 0922   HDL 54 07/24/2016 0922   CHOLHDL 2.6 07/24/2016 0922   CHOLHDL 3.4 08/31/2015 0920   VLDL 18 08/31/2015 0920   LDLCALC 72 07/24/2016 0922   Duplex carotids; 09/19/12 Summary:  - No significant extracranial carotid artery stenosis demonstrated. Vertebrals are patent with antegrade flow. -  TTE 09/19/12  - Left ventricle: The cavity size was normal. Wall thickness  was increased in a pattern of mild LVH. Systolic function was normal. The estimated ejection fraction was in the range of 50% to 55%. Possible hypokinesis of the apical myocardium. Left ventricular diastolic function parameters were normal. - Left atrium: The atrium was mildly dilated. Impressions:  - Possible small area of apical hypokinesis; suggest fu study with contrast to further evaluate if clinically indicated.  TTE: 5/71/2016 Left ventricle: The cavity size was normal. Wall thickness was increased in a pattern of mild LVH. Systolic function was normal. The estimated ejection fraction was in the range of 50% to 55%. Wall motion was normal; there were no regional wall motion abnormalities. Features are consistent with a pseudonormal left ventricular filling pattern, with concomitant abnormal relaxation and increased filling pressure (grade 2 diastolic dysfunction).  ------------------------------------------------------------------- Aortic valve: Structurally normal valve. Cusp separation was normal. Doppler:  Transvalvular velocity was within the normal range. There was no stenosis. There was no regurgitation.  ------------------------------------------------------------------- Aorta: Aortic root: The aortic root was normal in size. Ascending aorta: The ascending aorta was normal in size.  ------------------------------------------------------------------- Mitral valve: Structurally normal valve. Leaflet separation was normal. Doppler: Transvalvular velocity was within the normal range. There was no evidence for stenosis. There was no regurgitation.  ------------------------------------------------------------------- Left atrium: The atrium was normal in size.  ------------------------------------------------------------------- Right ventricle: The cavity size was normal. Systolic function was normal.  ------------------------------------------------------------------- Pulmonic valve: The valve appears to be grossly normal. Doppler: There was no significant regurgitation.  ------------------------------------------------------------------- Tricuspid valve: Structurally normal valve. Leaflet separation was normal. Doppler: Transvalvular velocity was within the normal range. There was no regurgitation.  ------------------------------------------------------------------- Right atrium: The atrium was mildly dilated.  ------------------------------------------------------------------- Pericardium: There was no pericardial effusion.  Exercise nuclear stress test: 02/26/2013 Quantitative Gated Spect Images  QGS EDV: 165 ml  QGS ESV: 90 ml  Impression  Exercise Capacity: Good exercise capacity.  BP Response: Hypertensive blood pressure response.  Clinical Symptoms: No chest pain.  ECG Impression: No significant ST segment change suggestive of ischemia.  Comparison with Prior Nuclear Study: No images to compare  Overall Impression: Low risk stress nuclear  study. No evidence of ischemia. Good exercise tolerance. LV systolic function is mildly depressed.  LV Ejection Fraction: 45%. LV Wall Motion: Normal Wall Motion. No segmental wall motion abnormalities. EF mildly depressed.  Thomas Brackbill  ECG: Personally reviewed, performed 07/24/2016 shows normal sinus rhythm moderate criteria for LVH otherwise normal EKG and unchanged from prior.   Assessment & Plan  1. Resistant hypertension, renal US negative in 2015 - increase imdur to 120 mg po daily - follow up with a pharmacist in 2 weks, if still elevated, add amlodipine  2. Chronic systolic CHF - the patient is euvolemic.  3. Chest pain - resolved. Coronary CTA in 07/2016 showed Coronary calcium score of 0.  Normal coronary origin with right dominance. No evidence of CAD.  4. Palpitations, history of TIA, mildly dilated left atrium - the patient couldn't efford eCardio monitor, he was enrolled for [employer] for [place]. 24H Holter was completely normal, no a-fib. Palpitations now resolved  5. Diabetic neuropathy -  HbA1c 6.4%, started on metformin 500 mg twice a day. We will increase gabapentin 300 mg po TID.  6. DM - on Metformin.  7. Hyperlipidemia - tolerating atorvastaton 40 mg daily.  Follow up in 2 weeks   Tobias Alexander, MD 06/26/2017, 6:07 AM

## 2017-06-26 LAB — BASIC METABOLIC PANEL
BUN/Creatinine Ratio: 14 (ref 9–20)
BUN: 14 mg/dL (ref 6–20)
CO2: 20 mmol/L (ref 20–29)
Calcium: 9.3 mg/dL (ref 8.7–10.2)
Chloride: 101 mmol/L (ref 96–106)
Creatinine, Ser: 0.97 mg/dL (ref 0.76–1.27)
GFR calc Af Amer: 116 mL/min/{1.73_m2} (ref >59)
GFR calc non Af Amer: 100 mL/min/{1.73_m2} (ref >59)
Glucose: 63 mg/dL — ABNORMAL LOW (ref 65–99)
Potassium: 4.2 mmol/L (ref 3.5–5.2)
Sodium: 141 mmol/L (ref 134–144)

## 2017-07-15 NOTE — Progress Notes (Signed)
Patient ID: Joseph Shannon                 DOB: July 18, 1980                      MRN: [id number]     HPI: Joseph Shannon is a 37 y.o. male patient of Dr. Meda Coffee who presents today for hypertension evaluation. PMH significant for TIA 2014, poorly controlled HTN (renal duplex normal 2015), asthma, chronic combined CHF (previous EF minimally decreased in 2014), OSA previously not 100% compliant with CPAP, family history of CAD (mom MI in early 67s, cousin died at 58 of MI, cousin with advanced HF), diabetes mellitus with neuropathy, and hyperlipidemia. His spironolactone was titrated to 50m daily and BMET returned WNL. At his last visit with Dr. NMeda Coffeehis Imdur was titrated to 1228mdaily.   He presents today for follow up. Chest pain has improved. He denies dizziness. He denies headaches and SOB. He reports fluctuating blood pressure measurements.   He reports only one cigarette since last visit.   Current HTN meds:  Imdur 12018maily  Lisinopril 63m46mily in the evening nebivolol 20mg19mly at bedtime  Furosemide 63mg 25mHydralazine 100mg T45m530am, 330pm, 8-9pm) Spironolactone 50mg da79min the morning  Previously tried: amlodipine - stopped by Dr. Nelson wMeda Coffeearted with beta blocker  BP goal: <130/80  Family History: Mother (HTN, heart disease, MI in 50s), Fa13sr (HTN, Heart disease), cousin died at 35 of MI32cousin with advanced HF, sister with DM  Social History: current smoker (2-3 cigarettes per day) he would like to try to quit with the patches, drinks beer on weekend watching football   Diet: Uses Mrs. DASH. Most meals are prepared from home. 12 ounces of coffee (usually drinks 1/2 of this), avoids soda, he does drink a lot of water. He occasionally drinks juice.   Exercise: No formal exercise. He is very active at work working on [employer] BP readings: checked at work 179/100, 170/90s  Wt Readings from Last 3 Encounters:  06/25/17 231 lb (104.8  kg)  04/25/17 231 lb (104.8 kg)  03/14/17 231 lb 12.8 oz (105.1 kg)   BP Readings from Last 3 Encounters:  07/16/17 (!) 148/98  06/25/17 (!) 170/120  06/18/17 (!) 144/98   Pulse Readings from Last 3 Encounters:  07/16/17 85  06/25/17 85  06/18/17 83    Renal function: CrCl cannot be calculated (Unknown ideal weight.).  Past Medical History:  Diagnosis Date  . Asthma   . Chronic combined systolic and diastolic CHF (congestive heart failure) (HCC)   .Sumasabetes mellitus without complication (HCC)   .Safety Harborpertension   . NICM (nonischemic cardiomyopathy) (HCC)   .Pearlrmal coronary arteries   . OSA (obstructive sleep apnea)   . TIA (transient ischemic attack) 2014    Current Outpatient Medications on File Prior to Visit  Medication Sig Dispense Refill  . albuterol (PROVENTIL HFA;VENTOLIN HFA) 108 (90 BASE) MCG/ACT inhaler Inhale 2 puffs into the lungs daily.     . albuteMarland Kitchenol (PROVENTIL) (2.5 MG/3ML) 0.083% nebulizer solution Take 2.5 mg by nebulization every 4 (four) hours as needed for wheezing or shortness of breath.    . aspiriMarland Kitchen EC 81 MG tablet Take 1 tablet (81 mg total) daily by mouth. 90 tablet 3  . atorvastatin (LIPITOR) 40 MG tablet Take 40 mg by mouth daily.    . fluticasone (FLOVENT HFA) 110 MCG/ACT inhaler 3 puffs twice daily  1 Inhaler 12  . furosemide (LASIX) 40 MG tablet Take 1 tablet (40 mg total) 2 (two) times daily by mouth. Take 40 mg at 8 am and 40 mg at 2 pm. 180 tablet 1  . gabapentin (NEURONTIN) 300 MG capsule Take 1 capsule (300 mg total) by mouth 3 (three) times daily. (Patient taking differently: Take 300 mg by mouth 2 (two) times daily. ) 90 capsule 8  . glucose monitoring kit (FREESTYLE) monitoring kit 1 each by Does not apply route as needed for other. Dispense any model that is covered- dispense testing supplies for Q AC/ HS accuchecks- 1 month supply with one refil. 1 each 1  . hydrALAZINE (APRESOLINE) 100 MG tablet Take 1 tablet (100 mg total) by mouth 3  (three) times daily. 270 tablet 3  . isosorbide mononitrate (IMDUR) 120 MG 24 hr tablet Take 1 tablet (120 mg total) by mouth daily. 30 tablet 2  . lisinopril (PRINIVIL,ZESTRIL) 40 MG tablet Take 1 tablet (40 mg total) by mouth daily. 90 tablet 6  . metFORMIN (GLUCOPHAGE) 500 MG tablet Take 1 tablet (500 mg total) by mouth 2 (two) times daily with a meal. 180 tablet 3  . Nebivolol HCl 20 MG TABS TAKE ONE TABLET BY MOUTH DAILY AT BEDTIME 90 tablet 1  . nitroGLYCERIN (NITROSTAT) 0.4 MG SL tablet Place 1 tablet (0.4 mg total) under the tongue every 5 (five) minutes as needed for chest pain. 25 tablet 3  . omeprazole (PRILOSEC) 20 MG capsule Take 1 capsule (20 mg total) by mouth daily. 30 capsule 11  . spironolactone (ALDACTONE) 50 MG tablet Take 1 tablet (50 mg total) by mouth daily. 30 tablet 3   No current facility-administered medications on file prior to visit.     Allergies  Allergen Reactions  . Naproxen Sodium Nausea And Vomiting    Blood pressure (!) 148/98, pulse 85.   Assessment/Plan: Hypertension:  BP today is above goal. Will add amlodipine 71m daily. Continue all medications as prescribed. Discussed ROX trial and he will review info and discuss with Dr. NMeda Coffeeat follow up. Advised he bring BP log to follow up with Dr. NMeda Coffeein 3 weeks. Follow up in HTN clinic as needed.    Smoking cessation: Patient has only smoked one cigarette since our last visit. Congratulated him on this and encouraged him to abstain completely between now and follow up with Dr. NMeda Coffee   Thank you, KLelan Pons APatterson Hammersmith [facility]  07/16/2017 4:01 PM

## 2017-07-16 ENCOUNTER — Ambulatory Visit (INDEPENDENT_AMBULATORY_CARE_PROVIDER_SITE_OTHER): Payer: BLUE CROSS/BLUE SHIELD | Admitting: Pharmacist

## 2017-07-16 VITALS — BP 148/98 | HR 85

## 2017-07-16 DIAGNOSIS — I10 Essential (primary) hypertension: Secondary | ICD-10-CM | POA: Diagnosis not present

## 2017-07-16 MED ORDER — AMLODIPINE BESYLATE 5 MG PO TABS: 5.0000 mg | ORAL_TABLET | Freq: Every day | ORAL | 3 refills | Status: DC

## 2017-07-16 NOTE — Patient Instructions (Signed)
START taking amlodipine 5mg  daily.   Continue all other medications as prescribed.

## 2017-07-29 ENCOUNTER — Other Ambulatory Visit: Payer: Self-pay | Admitting: Cardiology

## 2017-07-29 ENCOUNTER — Telehealth: Payer: Self-pay | Admitting: Cardiology

## 2017-07-29 DIAGNOSIS — G4730 Sleep apnea, unspecified: Secondary | ICD-10-CM

## 2017-07-29 DIAGNOSIS — E119 Type 2 diabetes mellitus without complications: Secondary | ICD-10-CM

## 2017-07-29 DIAGNOSIS — R079 Chest pain, unspecified: Secondary | ICD-10-CM

## 2017-07-29 DIAGNOSIS — I5023 Acute on chronic systolic (congestive) heart failure: Secondary | ICD-10-CM

## 2017-07-29 MED ORDER — GABAPENTIN 300 MG PO CAPS: 300.0000 mg | ORAL_CAPSULE | Freq: Three times a day (TID) | ORAL | 3 refills | Status: DC

## 2017-07-29 MED ORDER — LISINOPRIL 40 MG PO TABS: 40.0000 mg | ORAL_TABLET | Freq: Every day | ORAL | 3 refills | Status: DC

## 2017-07-29 MED ORDER — METFORMIN HCL 500 MG PO TABS: 500.0000 mg | ORAL_TABLET | Freq: Two times a day (BID) | ORAL | 3 refills | Status: DC

## 2017-07-29 MED ORDER — ISOSORBIDE MONONITRATE ER 120 MG PO TB24: 120.0000 mg | ORAL_TABLET | Freq: Every day | ORAL | 3 refills | Status: DC

## 2017-07-29 NOTE — Telephone Encounter (Signed)
New Message     *STAT* If patient is at the pharmacy, call can be transferred to refill team.   1. Which medications need to be refilled? (please list name of each medication and dose if known)  isosorbide mononitrate (IMDUR) 120 MG 24 hr tablet 2. Which pharmacy/location (including street and city if local pharmacy) is medication to be sent to? [facility] [place]   3. Do they need a 30 day or 90 day supply? 30 DAY SUPPLY    Patient states that [facility] is needing a new rx on file.

## 2017-07-29 NOTE — Telephone Encounter (Signed)
°*  STAT* If patient is at the pharmacy, call can be transferred to refill team.   1. Which medications need to be refilled? (please list name of each medication and dose if known)  lisinopril (PRINIVIL,ZESTRIL) 40 MG tablet Take 1 tablet (40 mg total) by mouth daily.   metFORMIN (GLUCOPHAGE) 500 MG tablet Take 1 tablet (500 mg total) by mouth 2 (two) times daily with a meal.   gabapentin (NEURONTIN) 300 MG capsule Take 1 capsule (300 mg total) by mouth 3 (three) times daily. Patient taking differently: Take 300 mg by mouth 2 (two) times daily.    2. Which pharmacy/location (including street and city if local pharmacy) is medication to be sent to?  [facility] [address] [place], [place] - [address] [phone] (Phone) [phone] (Fax)   3. Do they need a 30 day or 90 day supply? 30day

## 2017-07-29 NOTE — Telephone Encounter (Signed)
Pt's medication was sent to pt's pharmacy as requested. Confirmation received.  °

## 2017-08-06 ENCOUNTER — Ambulatory Visit (INDEPENDENT_AMBULATORY_CARE_PROVIDER_SITE_OTHER): Payer: BLUE CROSS/BLUE SHIELD | Admitting: Cardiology

## 2017-08-06 ENCOUNTER — Encounter: Payer: Self-pay | Admitting: Cardiology

## 2017-08-06 DIAGNOSIS — I10 Essential (primary) hypertension: Secondary | ICD-10-CM

## 2017-08-06 DIAGNOSIS — I110 Hypertensive heart disease with heart failure: Secondary | ICD-10-CM

## 2017-08-07 ENCOUNTER — Encounter: Payer: Self-pay | Admitting: Cardiology

## 2017-08-07 ENCOUNTER — Encounter: Payer: Self-pay | Admitting: *Deleted

## 2017-08-07 ENCOUNTER — Ambulatory Visit (INDEPENDENT_AMBULATORY_CARE_PROVIDER_SITE_OTHER): Payer: BLUE CROSS/BLUE SHIELD | Admitting: Cardiology

## 2017-08-07 VITALS — BP 142/82 | HR 86 | Ht 75.0 in | Wt 228.6 lb

## 2017-08-07 DIAGNOSIS — R0600 Dyspnea, unspecified: Secondary | ICD-10-CM

## 2017-08-07 DIAGNOSIS — R0609 Other forms of dyspnea: Secondary | ICD-10-CM | POA: Diagnosis not present

## 2017-08-07 DIAGNOSIS — I110 Hypertensive heart disease with heart failure: Secondary | ICD-10-CM

## 2017-08-07 DIAGNOSIS — I5033 Acute on chronic diastolic (congestive) heart failure: Secondary | ICD-10-CM

## 2017-08-07 MED ORDER — FUROSEMIDE 40 MG PO TABS: 40.0000 mg | ORAL_TABLET | Freq: Two times a day (BID) | ORAL | 1 refills | Status: DC

## 2017-08-07 MED ORDER — SPIRONOLACTONE 50 MG PO TABS: 50.0000 mg | ORAL_TABLET | Freq: Every day | ORAL | 3 refills | Status: DC

## 2017-08-07 MED ORDER — AMLODIPINE BESYLATE 5 MG PO TABS: 5.0000 mg | ORAL_TABLET | Freq: Every day | ORAL | 3 refills | Status: DC

## 2017-08-07 MED ORDER — HYDRALAZINE HCL 100 MG PO TABS: 100.0000 mg | ORAL_TABLET | Freq: Three times a day (TID) | ORAL | 3 refills | Status: DC

## 2017-08-07 NOTE — Progress Notes (Signed)
Patient ID: Joseph Shannon, male   DOB: 03/19/1981, 37 y.o.   MRN: [id number]    Patient Name: Joseph Shannon Date of Encounter: 08/07/2017  Primary Care Provider:  Eather Colas, FNP Primary Cardiologist:  Tobias Alexander  Patient Profile  Uncontrolled HTN  Problem List   Past Medical History:  Diagnosis Date  . Asthma   . Chronic combined systolic and diastolic CHF (congestive heart failure) (HCC)   . Diabetes mellitus without complication (HCC)   . Hypertension   . NICM (nonischemic cardiomyopathy) (HCC)   . Normal coronary arteries   . OSA (obstructive sleep apnea)   . TIA (transient ischemic attack) 2014   Past Surgical History:  Procedure Laterality Date  . NO PAST SURGERIES      Allergies  Allergies  Allergen Reactions  . Naproxen Sodium Nausea And Vomiting    HPI  37 year-old gentleman with history of hypertension and asthma who was seen in the emergency department for complaints of cough and chest pain and left sided weakness.   In May 2014 the patient presented with intermittent h/a for 4 days with elevated BP, but progressed to central chest tight and episode of coughing, BP elevated at 172/113. Cardiopulm exam benign. Neuro exam benign. EKG unchanged from prior. Pt admitted 5/13 for TIA/HTN, found to have possible apical hypokinesis, and benign appearing pineal cyst. He was started on lopressor & ACE inhibitor, asked to f/u with PCP. He has not yet been able to establish with one and has not been able to have stress testing as directed. CXR unremarkable, trop neg x2. Pt's h/a, CP, and BP improved after 1 SL NTG.   05/18/2013 The patient is coming here today and he states that he has just been almost daily, retrosternal pressure-like nonexertional but sometimes they feel like they're relieved by rest, he hasn't tried to use nitroglycerin with it.  The patient is very concerned as his mom with an early 55s just had a major myocardial infarction. His cousin  just had a left ventricular assist device placed heart failure, the patient is not aware of etiology. The patient is asymptomatic. He brings a BP diary, his BP is consistently elevated with values in 150-190 mmHg range.  Imdur 30 mg po daily and hydralazine 25 mg po TID was prescribed.  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 He reports that he continues to have exertional retrosternal pressure-like chest pain that's radiating to his neck and left arm. He feels tingling in his left arm on exertion and also night. He denies dizziness palpitations or syncope. He is compliant with his medications. His atorvastatin was most recently increased from 20-40 mg daily. He is very anxious about his chest pain as his first degree cousin died at age of 57 for myocardial infarction, and his grandfather just had another heart attack last Friday.  06/25/17 - 2 week follow up, he was seen by our pharmacist 3 x in the last 2 months and spironolactone was uptitrated to 50 mg po daily. His BP continues to run in 160'.  Denies any other symptoms such as chest pain, DOE, LE edema, orthopnea, PND.  08/06/2017 - disease 2 months follow-up, the patient has been doing well, he  Denies any chest pain or shortness of breath, no lower extremity edema orthopnea, PND, he has been complaint with his meds and has no side effects. NO orthostatic hypotension. No dizziness or falls.  Home Medications  Prior to Admission medications   Medication Sig Start Date End Date Taking? Authorizing Provider  albuterol (PROVENTIL HFA;VENTOLIN HFA) 108 (90 BASE) MCG/ACT inhaler Inhale 2 puffs into the lungs daily.    Yes Historical Provider, MD  albuterol (PROVENTIL) (2.5 MG/3ML) 0.083% nebulizer solution Take 2.5 mg by nebulization every 6 (six) hours as needed for wheezing.   Yes Historical Provider, MD  fluticasone (FLOVENT HFA) 110 MCG/ACT inhaler Inhale 1 puff into the lungs 2  (two) times daily as needed (for wheezing).    Yes Historical Provider, MD  lisinopril-hydrochlorothiazide (PRINZIDE,ZESTORETIC) 20-25 MG per tablet Take 2 tablets by mouth daily. 02/04/13  Yes Alison Murray, MD  metoprolol tartrate (LOPRESSOR) 25 MG tablet Take 1 tablet (25 mg total) by mouth 2 (two) times daily. 02/04/13  Yes Alison Murray, MD    Family History  Family History  Problem Relation Age of Onset  . Hypertension Mother   . Heart disease Mother   . Hypertension Father   . Heart disease Father   . Heart disease Paternal Uncle   . Heart disease Maternal Grandmother     Social History  Social History   Socioeconomic History  . Marital status: Single    Spouse name: Not on file  . Number of children: Not on file  . Years of education: Not on file  . Highest education level: Not on file  Occupational History  . Not on file  Social Needs  . Financial resource strain: Not on file  . Food insecurity:    Worry: Not on file    Inability: Not on file  . Transportation needs:    Medical: Not on file    Non-medical: Not on file  Tobacco Use  . Smoking status: Former Smoker    Packs/day: 0.25    Types: Cigarettes    Last attempt to quit: 06/18/2017    Years since quitting: 0.1  . Smokeless tobacco: Never Used  Substance and Sexual Activity  . Alcohol use: Yes    Comment: 3 beers and a shot per day- last use yesterday  . Drug use: No  . Sexual activity: Not on file  Lifestyle  . Physical activity:    Days per week: Not on file    Minutes per session: Not on file  . Stress: Not on file  Relationships  . Social connections:    Talks on phone: Not on file    Gets together: Not on file    Attends religious service: Not on file    Active member of club or organization: Not on file    Attends meetings of clubs or organizations: Not on file    Relationship status: Not on file  . Intimate partner violence:    Fear of current or ex partner: Not on file    Emotionally  abused: Not on file    Physically abused: Not on file    Forced sexual activity: Not on file  Other Topics Concern  . Not on file  Social History Narrative  . Not on file     Review of Systems, as per history of present illness General:  No chills, fever, night sweats or weight changes.  Cardiovascular:  No chest pain, dyspnea on exertion, edema, orthopnea, palpitations, paroxysmal nocturnal dyspnea. Dermatological: No rash, lesions/masses Respiratory: No cough, dyspnea Urologic: No hematuria, dysuria Abdominal:   No nausea, vomiting, diarrhea, bright red blood per rectum, melena, or hematemesis Neurologic:  No visual changes, wkns,  changes in mental status. All other systems reviewed and are otherwise negative except as noted above.  Physical Exam  General: Pleasant, NAD Psych: Normal affect. Neuro: Alert and oriented X 3. Moves all extremities spontaneously. HEENT: Normal  Neck: Supple without bruits or JVD. Lungs:  Resp regular and unlabored, CTA. Heart: RRR no s3, s4, or murmurs. Abdomen: Soft, non-tender, non-distended, BS + x 4.  Extremities: No clubbing, cyanosis, trace LE edema. DP/PT/Radials 2+ and equal bilaterally.  Accessory Clinical Findings  ECG - sinus rhythm 88 beats per minute, normal EKG  Lipid Panel     Component Value Date/Time   CHOL 141 07/24/2016 0922   TRIG 77 07/24/2016 0922   HDL 54 07/24/2016 0922   CHOLHDL 2.6 07/24/2016 0922   CHOLHDL 3.4 08/31/2015 0920   VLDL 18 08/31/2015 0920   LDLCALC 72 07/24/2016 0922   Duplex carotids; 09/19/12 Summary:  - No significant extracranial carotid artery stenosis demonstrated. Vertebrals are patent with antegrade flow. -  TTE 09/19/12  - Left ventricle: The cavity size was normal. Wall thickness was increased in a pattern of mild LVH. Systolic function was normal. The estimated ejection fraction was in the range of 50% to 55%. Possible hypokinesis of the apical myocardium. Left ventricular  diastolic function parameters were normal. - Left atrium: The atrium was mildly dilated. Impressions:  - Possible small area of apical hypokinesis; suggest fu study with contrast to further evaluate if clinically indicated.  TTE: 5/71/2016 Left ventricle: The cavity size was normal. Wall thickness was increased in a pattern of mild LVH. Systolic function was normal. The estimated ejection fraction was in the range of 50% to 55%. Wall motion was normal; there were no regional wall motion abnormalities. Features are consistent with a pseudonormal left ventricular filling pattern, with concomitant abnormal relaxation and increased filling pressure (grade 2 diastolic dysfunction).  ------------------------------------------------------------------- Aortic valve: Structurally normal valve. Cusp separation was normal. Doppler: Transvalvular velocity was within the normal range. There was no stenosis. There was no regurgitation.  ------------------------------------------------------------------- Aorta: Aortic root: The aortic root was normal in size. Ascending aorta: The ascending aorta was normal in size.  ------------------------------------------------------------------- Mitral valve: Structurally normal valve. Leaflet separation was normal. Doppler: Transvalvular velocity was within the normal range. There was no evidence for stenosis. There was no regurgitation.  ------------------------------------------------------------------- Left atrium: The atrium was normal in size.  ------------------------------------------------------------------- Right ventricle: The cavity size was normal. Systolic function was normal.  ------------------------------------------------------------------- Pulmonic valve: The valve appears to be grossly normal. Doppler: There was no significant  regurgitation.  ------------------------------------------------------------------- Tricuspid valve: Structurally normal valve. Leaflet separation was normal. Doppler: Transvalvular velocity was within the normal range. There was no regurgitation.  ------------------------------------------------------------------- Right atrium: The atrium was mildly dilated.  ------------------------------------------------------------------- Pericardium: There was no pericardial effusion.  Exercise nuclear stress test: 02/26/2013 Quantitative Gated Spect Images  QGS EDV: 165 ml  QGS ESV: 90 ml  Impression  Exercise Capacity: Good exercise capacity.  BP Response: Hypertensive blood pressure response.  Clinical Symptoms: No chest pain.  ECG Impression: No significant ST segment change suggestive of ischemia.  Comparison with Prior Nuclear Study: No images to compare  Overall Impression: Low risk stress nuclear study. No evidence of ischemia. Good exercise tolerance. LV systolic function is mildly depressed.  LV Ejection Fraction: 45%. LV Wall Motion: Normal Wall Motion. No segmental wall motion abnormalities. EF mildly depressed.  Thomas Brackbill  ECG: Personally reviewed, performed 07/24/2016 shows normal sinus rhythm moderate criteria for LVH otherwise normal EKG and unchanged  from prior.   Assessment & Plan  1. Resistant hypertension, renal US negative in 2015 - finally controlled, repeat BP today 125/ 75 mmHg, continue the same regimen - he is interested in a new study at [facility] with creating a renal atero-venous shunt for BP management, we will join a meeting the next week to gather more information about the study and device efficacy and safety  2. Chronic diastolic CHF - the patient is euvolemic. LVEF has improved, now 55/60%, grade 1 DD on echo in May 2018.  3. Chest pain - resolved. Coronary CTA in 07/2016 showed Coronary calcium score of 0.  Normal coronary origin with right  dominance. No evidence of CAD.  4. Palpitations, history of TIA, mildly dilated left atrium - the patient couldn't efford eCardio monitor, he was enrolled for [employer] for [place]. 24H Holter was completely normal, no a-fib. Palpitations now resolved  5. Diabetic neuropathy -  HbA1c 6.4%, started on metformin 500 mg twice a day. We will increase gabapentin 300 mg po TID.  6. DM - on Metformin.  7. Hyperlipidemia - tolerating atorvastaton 40 mg daily.  Follow up in 6 months.  Tobias Alexander, MD 08/07/2017, 11:09 AM

## 2017-08-07 NOTE — Progress Notes (Signed)
The appointment was rescheduled.

## 2017-08-07 NOTE — Patient Instructions (Addendum)

## 2017-08-27 ENCOUNTER — Other Ambulatory Visit: Payer: Self-pay

## 2017-08-27 DIAGNOSIS — R0609 Other forms of dyspnea: Secondary | ICD-10-CM

## 2017-08-27 DIAGNOSIS — I110 Hypertensive heart disease with heart failure: Secondary | ICD-10-CM

## 2017-08-27 DIAGNOSIS — I10 Essential (primary) hypertension: Secondary | ICD-10-CM

## 2017-08-27 DIAGNOSIS — I5023 Acute on chronic systolic (congestive) heart failure: Secondary | ICD-10-CM

## 2017-08-27 MED ORDER — NEBIVOLOL HCL 20 MG PO TABS
ORAL_TABLET | ORAL | 1 refills | Status: DC
Start: 2017-08-27 — End: 2018-04-08

## 2017-08-28 ENCOUNTER — Telehealth: Payer: Self-pay

## 2017-08-28 NOTE — Telephone Encounter (Signed)
I have done a [employer] PA through covermymeds. [id number]

## 2017-08-30 NOTE — Telephone Encounter (Signed)
**Note De-Identified Monique Gift Obfuscation** Message received from covermymeds: Leonette Nutting Key: [id number] Need help? Call us at [phone]  Outcome  Approved on May 1  Effective from 08/28/2017 through 08/26/2020.

## 2017-11-15 ENCOUNTER — Encounter

## 2017-11-22 DIAGNOSIS — M25511 Pain in right shoulder: Secondary | ICD-10-CM | POA: Diagnosis not present

## 2018-01-22 ENCOUNTER — Emergency Department (HOSPITAL_COMMUNITY)
Admission: EM | Admit: 2018-01-22 | Discharge: 2018-01-22 | Disposition: A | Discharge status: Home / Self Care | Payer: BLUE CROSS/BLUE SHIELD | Attending: Emergency Medicine | Admitting: Emergency Medicine

## 2018-01-22 ENCOUNTER — Ambulatory Visit (HOSPITAL_COMMUNITY)
Admission: EM | Admit: 2018-01-22 | Discharge: 2018-01-22 | Disposition: A | Discharge status: Home / Self Care | Payer: BLUE CROSS/BLUE SHIELD | Source: Home / Self Care | Attending: Family Medicine | Admitting: Family Medicine

## 2018-01-22 ENCOUNTER — Encounter (HOSPITAL_COMMUNITY): Payer: Self-pay | Admitting: Neurology

## 2018-01-22 ENCOUNTER — Encounter (HOSPITAL_COMMUNITY): Payer: Self-pay | Admitting: Emergency Medicine

## 2018-01-22 ENCOUNTER — Other Ambulatory Visit: Payer: Self-pay

## 2018-01-22 ENCOUNTER — Emergency Department (HOSPITAL_COMMUNITY): Payer: BLUE CROSS/BLUE SHIELD

## 2018-01-22 ENCOUNTER — Ambulatory Visit (INDEPENDENT_AMBULATORY_CARE_PROVIDER_SITE_OTHER): Payer: BLUE CROSS/BLUE SHIELD

## 2018-01-22 DIAGNOSIS — Z79899 Other long term (current) drug therapy: Secondary | ICD-10-CM | POA: Diagnosis not present

## 2018-01-22 DIAGNOSIS — Z87891 Personal history of nicotine dependence: Secondary | ICD-10-CM | POA: Insufficient documentation

## 2018-01-22 DIAGNOSIS — J45909 Unspecified asthma, uncomplicated: Secondary | ICD-10-CM | POA: Insufficient documentation

## 2018-01-22 DIAGNOSIS — E119 Type 2 diabetes mellitus without complications: Secondary | ICD-10-CM | POA: Insufficient documentation

## 2018-01-22 DIAGNOSIS — R51 Headache: Secondary | ICD-10-CM

## 2018-01-22 DIAGNOSIS — R519 Headache, unspecified: Secondary | ICD-10-CM

## 2018-01-22 DIAGNOSIS — Z7984 Long term (current) use of oral hypoglycemic drugs: Secondary | ICD-10-CM | POA: Insufficient documentation

## 2018-01-22 DIAGNOSIS — I10 Essential (primary) hypertension: Secondary | ICD-10-CM | POA: Insufficient documentation

## 2018-01-22 DIAGNOSIS — R0602 Shortness of breath: Secondary | ICD-10-CM | POA: Diagnosis not present

## 2018-01-22 DIAGNOSIS — R0781 Pleurodynia: Secondary | ICD-10-CM | POA: Diagnosis not present

## 2018-01-22 DIAGNOSIS — S299XXA Unspecified injury of thorax, initial encounter: Secondary | ICD-10-CM | POA: Diagnosis not present

## 2018-01-22 MED ORDER — KETOROLAC TROMETHAMINE 60 MG/2ML IM SOLN
60.0000 mg | Freq: Once | INTRAMUSCULAR | Status: AC
  Administered 2018-01-22: 60 mg via INTRAMUSCULAR
  Filled 2018-01-22: qty 2

## 2018-01-22 MED ORDER — METHOCARBAMOL 500 MG PO TABS: 500.0000 mg | ORAL_TABLET | Freq: Two times a day (BID) | ORAL | 0 refills | Status: DC
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
## 2018-01-22 NOTE — ED Triage Notes (Signed)
Pt reports having a headache that started yesterday.  Pt took one 200 mg ibuprofen yesterday.  He woke up this morning with his head feeling worse than yesterday.    Pt taking BP medications and was not sure what would be safe to take with his medications.
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
## 2018-01-22 NOTE — Discharge Instructions (Signed)
You can take 1000 mg of Tylenol.  Do not exceed 4000 mg of Tylenol a day.  Take Robaxin as prescribed. This medication will make you drowsy so do not drive or drink alcohol when taking it.  Follow-up with your primary care doctor in the next 3 to 4 days for further evaluation.  As we discussed, engage in brain rest.  This includes resting and relaxing.  Limit the amount of phone, TV, computer use.  Return the emergency department for any worsening headache, vision change, difficulty walking, numbness/weakness of your arms or legs, vomiting or any other worsening or concerning symptoms.

## 2018-01-31 DIAGNOSIS — Z9119 Patient's noncompliance with other medical treatment and regimen: Secondary | ICD-10-CM | POA: Diagnosis not present

## 2018-01-31 DIAGNOSIS — R9389 Abnormal findings on diagnostic imaging of other specified body structures: Secondary | ICD-10-CM | POA: Diagnosis not present

## 2018-01-31 DIAGNOSIS — R51 Headache: Secondary | ICD-10-CM | POA: Diagnosis not present

## 2018-02-14 DIAGNOSIS — Z111 Encounter for screening for respiratory tuberculosis: Secondary | ICD-10-CM | POA: Diagnosis not present

## 2018-02-14 DIAGNOSIS — J4521 Mild intermittent asthma with (acute) exacerbation: Secondary | ICD-10-CM | POA: Diagnosis not present

## 2018-02-14 DIAGNOSIS — E785 Hyperlipidemia, unspecified: Secondary | ICD-10-CM | POA: Diagnosis not present

## 2018-02-14 DIAGNOSIS — E1140 Type 2 diabetes mellitus with diabetic neuropathy, unspecified: Secondary | ICD-10-CM | POA: Diagnosis not present

## 2018-02-14 DIAGNOSIS — D7589 Other specified diseases of blood and blood-forming organs: Secondary | ICD-10-CM | POA: Diagnosis not present

## 2018-02-14 DIAGNOSIS — R9389 Abnormal findings on diagnostic imaging of other specified body structures: Secondary | ICD-10-CM | POA: Diagnosis not present

## 2018-02-14 DIAGNOSIS — I10 Essential (primary) hypertension: Secondary | ICD-10-CM | POA: Diagnosis not present

## 2018-02-18 DIAGNOSIS — R9389 Abnormal findings on diagnostic imaging of other specified body structures: Secondary | ICD-10-CM | POA: Diagnosis not present

## 2018-03-10 DIAGNOSIS — R918 Other nonspecific abnormal finding of lung field: Secondary | ICD-10-CM | POA: Diagnosis not present

## 2018-03-10 DIAGNOSIS — R9389 Abnormal findings on diagnostic imaging of other specified body structures: Secondary | ICD-10-CM | POA: Diagnosis not present

## 2018-03-14 DIAGNOSIS — J4521 Mild intermittent asthma with (acute) exacerbation: Secondary | ICD-10-CM | POA: Diagnosis not present

## 2018-03-14 DIAGNOSIS — R9389 Abnormal findings on diagnostic imaging of other specified body structures: Secondary | ICD-10-CM | POA: Diagnosis not present

## 2018-04-06 ENCOUNTER — Other Ambulatory Visit: Payer: Self-pay | Admitting: Cardiology

## 2018-04-06 DIAGNOSIS — I10 Essential (primary) hypertension: Secondary | ICD-10-CM

## 2018-04-06 DIAGNOSIS — I110 Hypertensive heart disease with heart failure: Secondary | ICD-10-CM

## 2018-04-06 DIAGNOSIS — I5023 Acute on chronic systolic (congestive) heart failure: Secondary | ICD-10-CM

## 2018-04-06 DIAGNOSIS — R0609 Other forms of dyspnea: Secondary | ICD-10-CM

## 2018-04-08 ENCOUNTER — Other Ambulatory Visit: Payer: Self-pay

## 2018-04-08 DIAGNOSIS — I110 Hypertensive heart disease with heart failure: Secondary | ICD-10-CM

## 2018-04-08 DIAGNOSIS — I5023 Acute on chronic systolic (congestive) heart failure: Secondary | ICD-10-CM

## 2018-04-08 DIAGNOSIS — R0609 Other forms of dyspnea: Secondary | ICD-10-CM

## 2018-04-08 DIAGNOSIS — I10 Essential (primary) hypertension: Secondary | ICD-10-CM

## 2018-04-08 MED ORDER — NEBIVOLOL HCL 20 MG PO TABS: ORAL_TABLET | ORAL | 0 refills | Status: DC

## 2018-05-15 ENCOUNTER — Emergency Department (HOSPITAL_COMMUNITY)
Admission: EM | Admit: 2018-05-15 | Discharge: 2018-05-15 | Disposition: A | Discharge status: Home / Self Care | Payer: BLUE CROSS/BLUE SHIELD | Attending: Emergency Medicine | Admitting: Emergency Medicine

## 2018-05-15 ENCOUNTER — Emergency Department (HOSPITAL_COMMUNITY): Payer: BLUE CROSS/BLUE SHIELD

## 2018-05-15 ENCOUNTER — Other Ambulatory Visit: Payer: Self-pay

## 2018-05-15 ENCOUNTER — Encounter (HOSPITAL_COMMUNITY): Payer: Self-pay | Admitting: Emergency Medicine

## 2018-05-15 DIAGNOSIS — R079 Chest pain, unspecified: Secondary | ICD-10-CM | POA: Diagnosis not present

## 2018-05-15 DIAGNOSIS — Z7984 Long term (current) use of oral hypoglycemic drugs: Secondary | ICD-10-CM | POA: Insufficient documentation

## 2018-05-15 DIAGNOSIS — R7989 Other specified abnormal findings of blood chemistry: Secondary | ICD-10-CM | POA: Diagnosis not present

## 2018-05-15 DIAGNOSIS — I5042 Chronic combined systolic (congestive) and diastolic (congestive) heart failure: Secondary | ICD-10-CM | POA: Insufficient documentation

## 2018-05-15 DIAGNOSIS — Z79899 Other long term (current) drug therapy: Secondary | ICD-10-CM | POA: Insufficient documentation

## 2018-05-15 DIAGNOSIS — I110 Hypertensive heart disease with heart failure: Secondary | ICD-10-CM | POA: Diagnosis not present

## 2018-05-15 DIAGNOSIS — E119 Type 2 diabetes mellitus without complications: Secondary | ICD-10-CM | POA: Diagnosis not present

## 2018-05-15 DIAGNOSIS — Z87891 Personal history of nicotine dependence: Secondary | ICD-10-CM | POA: Diagnosis not present

## 2018-05-15 DIAGNOSIS — J9811 Atelectasis: Secondary | ICD-10-CM | POA: Diagnosis not present

## 2018-05-15 DIAGNOSIS — R0789 Other chest pain: Secondary | ICD-10-CM | POA: Diagnosis not present

## 2018-05-15 LAB — BASIC METABOLIC PANEL
Anion gap: 11 (ref 5–15)
BUN: 22 mg/dL — ABNORMAL HIGH (ref 6–20)
CO2: 22 mmol/L (ref 22–32)
CREATININE: 1.64 mg/dL — AB (ref 0.61–1.24)
Calcium: 9.2 mg/dL (ref 8.9–10.3)
Chloride: 107 mmol/L (ref 98–111)
GFR calc non Af Amer: 53 mL/min — ABNORMAL LOW (ref >60)
Glucose, Bld: 160 mg/dL — ABNORMAL HIGH (ref 70–99)
Potassium: 4.3 mmol/L (ref 3.5–5.1)
Sodium: 140 mmol/L (ref 135–145)

## 2018-05-15 LAB — CBC
HCT: 41.4 % (ref 39.0–52.0)
Hemoglobin: 13.6 g/dL (ref 13.0–17.0)
MCH: 32.5 pg (ref 26.0–34.0)
MCHC: 32.9 g/dL (ref 30.0–36.0)
MCV: 99 fL (ref 80.0–100.0)
Platelets: 264 10*3/uL (ref 150–400)
RBC: 4.18 MIL/uL — ABNORMAL LOW (ref 4.22–5.81)
RDW: 12 % (ref 11.5–15.5)
WBC: 6.4 10*3/uL (ref 4.0–10.5)
nRBC: 0 % (ref 0.0–0.2)

## 2018-05-15 LAB — I-STAT TROPONIN, ED
Troponin i, poc: 0 ng/mL (ref 0.00–0.08)
Troponin i, poc: 0 ng/mL (ref 0.00–0.08)

## 2018-05-15 MED ORDER — SODIUM CHLORIDE 0.9% FLUSH: 3.0000 mL | Freq: Once | INTRAVENOUS | Status: DC

## 2018-05-15 MED ORDER — NITROGLYCERIN 0.4 MG SL SUBL
0.4000 mg | SUBLINGUAL_TABLET | Freq: Once | SUBLINGUAL | Status: DC
  Filled 2018-05-15: qty 1

## 2018-05-15 NOTE — ED Triage Notes (Signed)
Pt reports substernal chest pain with sob that began yesterday, he states "I felt like someone was standing on my chest". He reports using his inhaler x2 today. Hx of CHF. A/o at triage speaking full sentences.

## 2018-05-15 NOTE — Discharge Instructions (Addendum)
Your creatinine (a marker of your kidney function) is elevated at 1.6 while your baseline is close to 1.0. You need to call your primary care doctor and cardiologist to be reevaluated in the next 1 to 2 days for repeat lab testing, reevaluation, possible medication changes, and possibly additional tests as well.

## 2018-05-15 NOTE — ED Provider Notes (Signed)
[facility] EMERGENCY DEPARTMENT Provider Note   CSN: [id number] Arrival date & time: 05/15/18  1304     History   Chief Complaint Chief Complaint  Patient presents with  . Chest Pain    HPI Joseph Shannon is a 38 y.o. male.   Chest Pain  Associated symptoms: shortness of breath   Associated symptoms: no abdominal pain, no back pain, no cough, no fever, no palpitations and no vomiting      Joseph Shannon is a 38 y.o. male with PMH of asthma, nonischemic cardiomyopathy and chronic combined systolic and diastolic heart failure, HTN, OSA, TIA who presents with 2 days of nearly constant chest pressure.  Reports it is substernal and nonradiating associated with some lightheadedness and some dyspnea.  He does get worse with exertion as he was climbing ladders and otherwise walking at work today.  No recent falls or trauma.  Improved with sublingual nitroglycerin yesterday.  No fevers or chills and no cough.  Does not feel that he is volume overloaded and has been compliant with all of his medications.  No lower extremity swelling or erythema and no recent surgeries.  Eating and drinking normally.  Past Medical History:  Diagnosis Date  . Asthma   . Chronic combined systolic and diastolic CHF (congestive heart failure) ([place])   . Diabetes mellitus without complication ([place])   . Hypertension   . NICM (nonischemic cardiomyopathy) ([place])   . Normal coronary arteries   . OSA (obstructive sleep apnea)   . TIA (transient ischemic attack) 2014    Patient Active Problem List   Diagnosis Date Noted  . Hyperlipidemia 07/24/2016  . History of TIA (transient ischemic attack) 07/13/2016  . Fever blister 06/12/2016  . Gastroenteritis 06/12/2016  . Mild intermittent asthma with exacerbation 06/12/2016  . Tobacco use 03/26/2016  . Diverticulitis of intestine without perforation or abscess without bleeding 12/20/2015  . Family history of malignant neoplasm of colon in  first degree relative diagnosed when younger than 38 years of age 01/20/2016  . Hypertensive heart disease 08/31/2015  . Sweating 08/31/2015  . Sinus tarsitis of left foot 07/28/2015  . Acute on chronic systolic heart failure ([place]) 09/09/2014  . Sleep apnea 09/09/2014  . Diabetes mellitus ([place]) 03/08/2014  . DM (diabetes mellitus) ([place]) 08/20/2013  . Chest pain 08/19/2013  . Paresthesias 08/19/2013  . DOE (dyspnea on exertion) 08/19/2013  . Hypertensive urgency 09/19/2012  . 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 Temporary cerebral vascular dysfunction 09/18/2012  . Hypertension   . Asthma     Past Surgical History:  Procedure Laterality Date  . NO PAST SURGERIES          Home Medications    Prior to Admission medications   Medication Sig Start Date End Date Taking? Authorizing Provider  albuterol (PROVENTIL HFA;VENTOLIN HFA) 108 (90 BASE) MCG/ACT inhaler Inhale 2 puffs into the lungs daily as needed for wheezing or shortness of breath.    Yes [provider]  albuterol (PROVENTIL) (2.5 MG/3ML) 0.083% nebulizer solution Take 2.5 mg by nebulization every 4 (four) hours as needed for wheezing or shortness of breath.   Yes [provider]  atorvastatin (LIPITOR) 40 MG tablet Take 40 mg by mouth daily.   Yes [provider]  cyclobenzaprine (FLEXERIL) 5 MG tablet Take 5 mg by mouth as needed for muscle spasms. 11/21/17  Yes [provider]  fluticasone (FLOVENT HFA) 110 MCG/ACT inhaler 3 puffs twice daily Patient taking differently: Inhale 3 puffs  into the lungs 2 (two) times daily.  05/08/14  Yes Harden Mo, MD  gabapentin (NEURONTIN) 300 MG capsule Take 1 capsule (300 mg total) by mouth 3 (three) times daily. Patient taking differently: Take 300 mg by mouth daily.  07/29/17  Yes Dorothy Spark, MD  hydrALAZINE (APRESOLINE) 100 MG tablet Take 1 tablet (100 mg total) by mouth 3 (three) times daily. 08/07/17  Yes Dorothy Spark, MD  isosorbide  mononitrate (IMDUR) 120 MG 24 hr tablet Take 1 tablet (120 mg total) by mouth daily. 07/29/17  Yes Dorothy Spark, MD  lisinopril (PRINIVIL,ZESTRIL) 40 MG tablet Take 1 tablet (40 mg total) by mouth daily. 07/29/17  Yes Dorothy Spark, MD  metFORMIN (GLUCOPHAGE) 500 MG tablet Take 1 tablet (500 mg total) by mouth 2 (two) times daily with a meal. 07/29/17  Yes Dorothy Spark, MD  Multiple Vitamin (MULTIVITAMIN) tablet Take 1 tablet by mouth daily.   Yes [provider]  Nebivolol HCl (BYSTOLIC) 20 MG TABS TAKE 1 TABLET BY MOUTH DAILY AT BEDTIME 04/08/18  Yes Dorothy Spark, MD  nitroGLYCERIN (NITROSTAT) 0.4 MG SL tablet Place 1 tablet (0.4 mg total) under the tongue every 5 (five) minutes as needed for chest pain. 02/20/13  Yes Dorothy Spark, MD  Omega-3 Fatty Acids (FISH OIL) 1000 MG CAPS Take 1,000 mg by mouth every evening.   Yes [provider]  omeprazole (PRILOSEC) 20 MG capsule Take 1 capsule (20 mg total) by mouth daily. 08/31/15  Yes Dorothy Spark, MD  spironolactone (ALDACTONE) 50 MG tablet Take 1 tablet (50 mg total) by mouth daily. 08/07/17 05/15/18 Yes Dorothy Spark, MD  amLODipine (NORVASC) 5 MG tablet Take 1 tablet (5 mg total) by mouth daily. Patient not taking: Reported on 05/15/2018 08/07/17 01/22/18  Dorothy Spark, MD  aspirin EC 81 MG tablet Take 1 tablet (81 mg total) daily by mouth. Patient not taking: Reported on 01/22/2018 03/14/17   Charlie Pitter, PA-C  furosemide (LASIX) 40 MG tablet Take 1 tablet (40 mg total) by mouth 2 (two) times daily. Take 40 mg at 8 am and 40 mg at 2 pm. Patient not taking: Reported on 05/15/2018 08/07/17   Dorothy Spark, MD  glucose monitoring kit (FREESTYLE) monitoring kit 1 each by Does not apply route as needed for other. Dispense any model that is covered- dispense testing supplies for Q AC/ HS accuchecks- 1 month supply with one refil. 08/20/13   Lorayne Marek, MD  methocarbamol (ROBAXIN) 500 MG tablet  Take 1 tablet (500 mg total) by mouth 2 (two) times daily. Patient not taking: Reported on 05/15/2018 01/22/18   Volanda Napoleon, PA-C    Family History Family History  Problem Relation Age of Onset  . Hypertension Mother   . Heart disease Mother   . Hypertension Father   . Heart disease Father   . Heart disease Paternal Uncle   . Heart disease Maternal Grandmother     Social History Social History   Tobacco Use  . Smoking status: Former Smoker    Packs/day: 0.25    Types: Cigarettes    Last attempt to quit: 06/18/2017    Years since quitting: 0.9  . Smokeless tobacco: Never Used  Substance Use Topics  . Alcohol use: Yes    Comment: 3 beers and a shot per day- last use yesterday  . Drug use: No     Allergies   Naproxen sodium   Review  of Systems Review of Systems  Constitutional: Negative for chills and fever.  HENT: Negative for ear pain and sore throat.   Eyes: Negative for pain and visual disturbance.  Respiratory: Positive for chest tightness and shortness of breath. Negative for cough.   Cardiovascular: Positive for chest pain. Negative for palpitations.  Gastrointestinal: Negative for abdominal pain and vomiting.  Genitourinary: Negative for dysuria and hematuria.  Musculoskeletal: Negative for arthralgias and back pain.  Skin: Negative for color change and rash.  Neurological: Positive for light-headedness. Negative for seizures and syncope.  All other systems reviewed and are negative.    Physical Exam Updated Vital Signs BP 121/78   Pulse 72   Temp 98.4 F (36.9 C) (Oral)   Resp 20   SpO2 98%   Physical Exam Vitals signs and nursing note reviewed.  Constitutional:      Appearance: Normal appearance. He is well-developed. He is not ill-appearing.  HENT:     Head: Normocephalic and atraumatic.  Eyes:     Conjunctiva/sclera: Conjunctivae normal.  Neck:     Musculoskeletal: Neck supple.  Cardiovascular:     Rate and Rhythm: Normal rate and  regular rhythm.     Heart sounds: No murmur.  Pulmonary:     Effort: Pulmonary effort is normal. No respiratory distress.     Breath sounds: Normal breath sounds.  Abdominal:     Palpations: Abdomen is soft.     Tenderness: There is no abdominal tenderness.  Skin:    General: Skin is warm and dry.     Capillary Refill: Capillary refill takes less than 2 seconds.  Neurological:     General: No focal deficit present.     Mental Status: He is alert and oriented to person, place, and time.  Psychiatric:        Behavior: Behavior is cooperative.      ED Treatments / Results  Labs (all labs ordered are listed, but only abnormal results are displayed) Labs Reviewed  BASIC METABOLIC PANEL - Abnormal; Notable for the following components:      Result Value   Glucose, Bld 160 (*)    BUN 22 (*)    Creatinine, Ser 1.64 (*)    GFR calc non Af Amer 53 (*)    All other components within normal limits  CBC - Abnormal; Notable for the following components:   RBC 4.18 (*)    All other components within normal limits  I-STAT TROPONIN, ED  I-STAT TROPONIN, ED    EKG EKG Interpretation  Date/Time:  Thursday May 15 2018 13:13:28 EST Ventricular Rate:  98 PR Interval:  164 QRS Duration: 94 QT Interval:  368 QTC Calculation: 469 R Axis:   35 Text Interpretation:  Normal sinus rhythm Normal ECG No significant change was found Confirmed by Jola Schmidt [phone]) on 05/15/2018 5:41:00 PM   Radiology Dg Chest 2 View  Result Date: 05/15/2018 CLINICAL DATA:  Chest pressure. EXAM: CHEST - 2 VIEW COMPARISON:  CT 03/10/2018, 08/02/2016. Chest x-ray 01/22/2018, 01/26/2013. FINDINGS: Mediastinum and hilar structures normal. Improved atelectatic changes left mid lung with mild residual. No acute infiltrate. No pleural effusion pneumothorax. Heart size normal. Degenerative changes scoliosis thoracic spine. IMPRESSION: Improved atelectatic changes left mid lung with mild residual. No acute  abnormality identified. Electronically Signed   By: Marcello Moores  Register   On: 05/15/2018 14:13    Procedures Procedures (including critical care time)  Medications Ordered in ED Medications  sodium chloride flush (NS) 0.9 % injection 3  mL (has no administration in time range)  nitroGLYCERIN (NITROSTAT) SL tablet 0.4 mg (0.4 mg Sublingual Not Given 05/15/18 1833)     Initial Impression / Assessment and Plan / ED Course  I have reviewed the triage vital signs and the nursing notes.  Pertinent labs & imaging results that were available during my care of the patient were reviewed by me and considered in my medical decision making (see chart for details).      MDM:  Imaging: X-ray shows improved atelectatic changes in the left midlung with mild residual.  No acute abnormality identified.  ED Provider Interpretation of EKG: Sinus rhythm with a rate of 98 bpm, normal axis, no ST segment elevation or depression, pathologic T wave changes.  No bundle-branch blocks.  Prolonged QTC of 469.  Labs: CBC unremarkable, BMP with BUN 22 and Cr 1.64 with prior baseline around 1.0, troponin 0 and delta 0  On initial evaluation, patient appears well. Afebrile and hemodynamically stable. Alert and oriented x4, pleasant, and cooperative.  Presents with shortness of breath and chest pain as above.  Last echo Sep 05, 2016 showing LVEF of 55 to 60% with no regional wall motion abnormality.  Grade 1 diastolic dysfunction.  On exam, patient does not appear volume overloaded.  Chest x-ray ordered prior to my evaluation shows improved atelectatic changes but otherwise no acute evidence for volume overload or other pathology.  No pneumonia or pneumothorax.  Do not feel the BNP is indicated.  He has had no tachycardia, hypoxia, or tachypnea.  No evidence for DVT on exam and no history of DVT or PE.  Low suspicion for PE at this time.  EKG shows no evidence for acute ischemia or arrhythmia.  Patient has had several  evaluations for ischemic cardiomyopathy in the past was found to have no coronary abnormality.  Review of cardiology notes shows patient has had perfusion imaging as well as a coronary CT back in April 2018 that showed normal coronary arteries.  His HEAR score is 4, however, given that he has had this discomfort for approximately 2 days without complete resolution feel that delta troponin is reasonable.  Additionally, he states his cardiologist was planning for outpatient stress test and follow-up.  Initial troponin is 0 as above and delta 0.  Low suspicion for pericarditis or myocarditis at this time.  No evidence for acute infectious etiology.  Patient does have increased creatinine to 1.6 from his baseline of around 1.  However, no creatinine checked within the last 10 months in our system.  He states he has had no recent changes in his medications but was started on Spironolactone in early 2019.  Given that he is eating and drinking normally with normal stool and urine output and no recent changes in his medications suspect that this is a chronic elevation that is occurred in the interim from his last check.  Do not feel that he requires admission for AKI work-up.  He was counseled extensively on the finding of this lab value and that he must follow-up with his primary care doctor as well as cardiologist closely in the outpatient setting.  He verbalized understanding.  He was discharged in stable condition with strict return precautions.  The plan for this patient was discussed with Dr. Venora Maples who voiced agreement and who oversaw evaluation and treatment of this patient.   The patient was fully informed and involved with the history taking, evaluation, workup including labs/images, and plan. The patient's concerns and questions were  addressed to the patient's satisfaction and he expressed agreement with the plan to DC home.    Final Clinical Impressions(s) / ED Diagnoses   Final diagnoses:  Chest  pain, unspecified type  Elevated serum creatinine    ED Discharge Orders    None       Iban Utz, Rodena Goldmann, MD 05/15/18 Holli Humbles    Jola Schmidt, MD 05/16/18 [phone]

## 2018-05-15 NOTE — ED Notes (Signed)
Patient verbalizes understanding of discharge instructions. Opportunity for questioning and answers were provided. Armband removed by staff, pt discharged from ED ambulatory w/ wife 

## 2018-05-16 ENCOUNTER — Encounter: Payer: Self-pay | Admitting: Nurse Practitioner

## 2018-05-19 ENCOUNTER — Ambulatory Visit: Payer: BLUE CROSS/BLUE SHIELD | Admitting: Nurse Practitioner

## 2018-05-19 ENCOUNTER — Encounter: Payer: Self-pay | Admitting: Nurse Practitioner

## 2018-05-19 ENCOUNTER — Encounter: Payer: Self-pay | Admitting: *Deleted

## 2018-05-19 VITALS — BP 110/62 | HR 89 | Ht 76.0 in | Wt 230.8 lb

## 2018-05-19 DIAGNOSIS — I110 Hypertensive heart disease with heart failure: Secondary | ICD-10-CM | POA: Diagnosis not present

## 2018-05-19 DIAGNOSIS — I10 Essential (primary) hypertension: Secondary | ICD-10-CM

## 2018-05-19 DIAGNOSIS — R079 Chest pain, unspecified: Secondary | ICD-10-CM | POA: Diagnosis not present

## 2018-05-19 LAB — BASIC METABOLIC PANEL
BUN/Creatinine Ratio: 17 (ref 9–20)
BUN: 20 mg/dL (ref 6–20)
CO2: 23 mmol/L (ref 20–29)
Calcium: 9.6 mg/dL (ref 8.7–10.2)
Chloride: 102 mmol/L (ref 96–106)
Creatinine, Ser: 1.17 mg/dL (ref 0.76–1.27)
GFR calc Af Amer: 92 mL/min/{1.73_m2} (ref >59)
GFR calc non Af Amer: 79 mL/min/{1.73_m2} (ref >59)
Glucose: 103 mg/dL — ABNORMAL HIGH (ref 65–99)
Potassium: 4.9 mmol/L (ref 3.5–5.2)
Sodium: 140 mmol/L (ref 134–144)

## 2018-05-19 MED ORDER — FUROSEMIDE 40 MG PO TABS: 40.0000 mg | ORAL_TABLET | Freq: Every day | ORAL | 3 refills | Status: DC

## 2018-05-19 NOTE — Progress Notes (Addendum)
CARDIOLOGY OFFICE NOTE  Date:  05/19/2018    Joseph Shannon Date of Birth: 1980-09-12 Medical Record #[id number]  PCP:  Deborah Chalk, FNP  Cardiologist:  Meda Coffee  Chief Complaint  Patient presents with  . Follow-up    Post ER evaluation    History of Present Illness: Joseph Shannon is a 38 y.o. male who presents today for a follow up visit. Seen for Dr. Meda Coffee.   He has a history of NICM, chronic combined systolic and diastolic HF - prior EF of 45% - last EF normal by echo in 2018, HTN and asthma. He has had prior ER visits for chest pain in the setting of marked HTN.   He has been followed over the past several years - has had chronic chest pains. FH + for early CAD. Last seen here back in April of 2019 by Dr. Meda Coffee - felt to be doing ok. He has had prior normal perfusion imaging as well as normal coronary CT imaging in 2018.   Presented to the ER last Thursday with chest pain - negative evaluation - was asked to follow up back here.   Comes in today. Here alone. He was to have been seen back in December - could not get in - he wishes to see Dr. Meda Coffee but no availability. He is unclear about his medicines. Says he was told by Dr. Meda Coffee to take his Gabapentin and Hydralazine just twice a day. He is unsure about his Norvasc - probably taking. He is not taking his Aldactone - due to worrying about kidney function since he was in the ER and was told he had CKD. He felt "not well and was lightheaded" moreso last week. Now feels ok. BP running less than 115 consistently at home. Does not have scales. Says he is restricting his salt.   Lipids are in [place] from October noted.  Past Medical History:  Diagnosis Date  . Asthma   . Chronic combined systolic and diastolic CHF (congestive heart failure) ([place])   . Diabetes mellitus without complication ([place])   . Hypertension   . NICM (nonischemic cardiomyopathy) ([place])   . Normal coronary arteries   . OSA  (obstructive sleep apnea)   . TIA (transient ischemic attack) 2014    Past Surgical History:  Procedure Laterality Date  . NO PAST SURGERIES       Medications: Current Meds  Medication Sig  . albuterol (PROVENTIL HFA;VENTOLIN HFA) 108 (90 BASE) MCG/ACT inhaler Inhale 2 puffs into the lungs daily as needed for wheezing or shortness of breath.   Marland Kitchen albuterol (PROVENTIL) (2.5 MG/3ML) 0.083% nebulizer solution Take 2.5 mg by nebulization every 4 (four) hours as needed for wheezing or shortness of breath.  Marland Kitchen amLODipine (NORVASC) 5 MG tablet Take 1 tablet (5 mg total) by mouth daily.  Marland Kitchen aspirin EC 81 MG tablet Take 1 tablet (81 mg total) daily by mouth.  Marland Kitchen atorvastatin (LIPITOR) 40 MG tablet Take 40 mg by mouth daily.  . cyclobenzaprine (FLEXERIL) 5 MG tablet Take 5 mg by mouth as needed for muscle spasms.  . fluticasone (FLOVENT HFA) 110 MCG/ACT inhaler Inhale 3 puffs into the lungs 3 (three) times daily.  . furosemide (LASIX) 40 MG tablet Take 1 tablet (40 mg total) by mouth 2 (two) times daily. Take 40 mg at 8 am and 40 mg at 2 pm.  . gabapentin (NEURONTIN) 300 MG capsule Take 300 mg by mouth 2 (two) times daily.  Marland Kitchen glucose  monitoring kit (FREESTYLE) monitoring kit 1 each by Does not apply route as needed for other. Dispense any model that is covered- dispense testing supplies for Q AC/ HS accuchecks- 1 month supply with one refil.  . hydrALAZINE (APRESOLINE) 100 MG tablet Take 100 mg by mouth 2 (two) times daily.  . isosorbide mononitrate (IMDUR) 120 MG 24 hr tablet Take 1 tablet (120 mg total) by mouth daily.  Marland Kitchen lisinopril (PRINIVIL,ZESTRIL) 40 MG tablet Take 1 tablet (40 mg total) by mouth daily.  . metFORMIN (GLUCOPHAGE) 500 MG tablet Take 1 tablet (500 mg total) by mouth 2 (two) times daily with a meal.  . methocarbamol (ROBAXIN) 500 MG tablet Take 1 tablet (500 mg total) by mouth 2 (two) times daily.  . Multiple Vitamin (MULTIVITAMIN) tablet Take 1 tablet by mouth daily.  . Nebivolol  HCl (BYSTOLIC) 20 MG TABS TAKE 1 TABLET BY MOUTH DAILY AT BEDTIME  . nitroGLYCERIN (NITROSTAT) 0.4 MG SL tablet Place 1 tablet (0.4 mg total) under the tongue every 5 (five) minutes as needed for chest pain.  . Omega-3 Fatty Acids (FISH OIL) 1000 MG CAPS Take 1,000 mg by mouth every evening.  Marland Kitchen omeprazole (PRILOSEC) 20 MG capsule Take 1 capsule (20 mg total) by mouth daily.  . [DISCONTINUED] fluticasone (FLOVENT HFA) 110 MCG/ACT inhaler 3 puffs twice daily (Patient taking differently: Inhale 3 puffs into the lungs 2 (two) times daily. )  . [DISCONTINUED] gabapentin (NEURONTIN) 300 MG capsule Take 1 capsule (300 mg total) by mouth 3 (three) times daily. (Patient taking differently: Take 300 mg by mouth daily. )  . [DISCONTINUED] hydrALAZINE (APRESOLINE) 100 MG tablet Take 1 tablet (100 mg total) by mouth 3 (three) times daily.     Allergies: Allergies  Allergen Reactions  . Naproxen Sodium Nausea And Vomiting    Social History: The patient  reports that he quit smoking about 11 months ago. His smoking use included cigarettes. He smoked 0.25 packs per day. He has never used smokeless tobacco. He reports current alcohol use. He reports that he does not use drugs.   Family History: The patient's family history includes Heart disease in his father, maternal grandmother, mother, and paternal uncle; Hypertension in his father and mother.   Review of Systems: Please see the history of present illness.   Otherwise, the review of systems is positive for none.   All other systems are reviewed and negative.   Physical Exam: VS:  BP 110/62 (BP Location: Left Arm, Patient Position: Sitting, Cuff Size: Large)   Pulse 89   Ht 6' 4"  (1.93 m)   Wt 230 lb 12.8 oz (104.7 kg)   SpO2 96% Comment: at rest  BMI 28.09 kg/m  .  BMI Body mass index is 28.09 kg/m.  Wt Readings from Last 3 Encounters:  05/19/18 230 lb 12.8 oz (104.7 kg)  01/22/18 235 lb (106.6 kg)  08/07/17 228 lb 9.6 oz (103.7 kg)     General: Pleasant. Well developed, well nourished and in no acute distress.   HEENT: Normal.  Neck: Supple, no JVD, carotid bruits, or masses noted.  Cardiac: Regular rate and rhythm. No murmurs, rubs, or gallops. No edema.  Respiratory:  Lungs are clear to auscultation bilaterally with normal work of breathing.  GI: Soft and nontender.  MS: No deformity or atrophy. Gait and ROM intact.  Skin: Warm and dry. Color is normal.  Neuro:  Strength and sensation are intact and no gross focal deficits noted.  Psych: Alert, appropriate and  with normal affect.   LABORATORY DATA:  EKG:  EKG is not ordered today.  Lab Results  Component Value Date   WBC 6.4 05/15/2018   HGB 13.6 05/15/2018   HCT 41.4 05/15/2018   PLT 264 05/15/2018   GLUCOSE 160 (H) 05/15/2018   CHOL 141 07/24/2016   TRIG 77 07/24/2016   HDL 54 07/24/2016   LDLCALC 72 07/24/2016   ALT 18 07/24/2016   AST 14 07/24/2016   NA 140 05/15/2018   K 4.3 05/15/2018   CL 107 05/15/2018   CREATININE 1.64 (H) 05/15/2018   BUN 22 (H) 05/15/2018   CO2 22 05/15/2018   TSH 0.518 03/14/2017   HGBA1C 6.4 03/08/2014     BNP (last 3 results) No results for input(s): BNP in the last 8760 hours.  ProBNP (last 3 results) No results for input(s): PROBNP in the last 8760 hours.   Other Studies Reviewed Today:  Echo Study Conclusions 08/2016  - Left ventricle: The cavity size was normal. There was moderate   concentric hypertrophy. Systolic function was normal. The   estimated ejection fraction was in the range of 55% to 60%. Wall   motion was normal; there were no regional wall motion   abnormalities. Doppler parameters are consistent with abnormal   left ventricular relaxation (grade 1 diastolic dysfunction).   Doppler parameters are consistent with indeterminate ventricular   filling pressure. - Aortic valve: Transvalvular velocity was within the normal range.   There was no stenosis. There was no regurgitation. -  Mitral valve: Transvalvular velocity was within the normal range.   There was no evidence for stenosis. There was no regurgitation. - Right ventricle: The cavity size was normal. Wall thickness was   normal. Systolic function was normal. - Atrial septum: No defect or patent foramen ovale was identified. - Tricuspid valve: There was no regurgitation.  Coronary CT IMPRESSION 07/2016: 1. Coronary calcium score of 0. This was 0 percentile for age and sex matched control.  2. Normal coronary origin with right dominance.  3. No evidence of CAD.  4. Mildly dilated pulmonary artery suggestive of pulmonary hypertension.  Ena Dawley   Electronically Signed   By: Ena Dawley   On: 08/04/2016 14:58   Assessment/Plan:  1. Chest pain - negative past Myoview - no evidence of CAD per cardiac CT from 07/2016 - would favor CV risk factor modification.   2. HTN - he has had prior negative renal US in 2015 - BP soft - I suspect this may be more the issue. He is no longer taking his Aldactone due to kidney concerns - will continue to hold for now. He will continue to monitor BP at home. Salt restriction imperative. Echo to be updated.   3. History of NICM - EF is normal by last echo - will get updated. He looks quite compensated - I am cutting his Lasix back today.   4. Chronic diastolic HF - see #3  5. DM -  Per PCP  6. HLD - on statin - labs noted in Care Everywhere  7. CKD - looks dry to me - cutting Lasix back to just one a day.   Current medicines are reviewed with the patient today.  The patient does not have concerns regarding medicines other than what has been noted above.  The following changes have been made:  See above.  Labs/ tests ordered today include:    Orders Placed This Encounter  Procedures  . Basic metabolic panel  .  ECHOCARDIOGRAM COMPLETE     Disposition:   FU with Dr. Meda Coffee and her team in 4 to 6 weeks.   Patient is agreeable to this plan  and will call if any problems develop in the interim.   SignedTruitt Merle, NP  05/19/2018 9:00 AM  [facility] [address] [place], [place]  [address] Phone: [phone] Fax: [phone]       Addendum: 05/20/18 Patient presents this afternoon for echocardiogram. Complaining of left flank pain. Believes this is due to Aldactone - which he has been off for the past several days and it has not been restarted at our visit yesterday. His labs from yesterday showed normal kidney function.   I have brought him back to our POD N/M after Andee Poles my CMA went to speak to him several times to no avail - he continued to be on his phone throughout these encounters as well. I have explained to him that this is not something that I can take care of for him. I have recommended that he see PCP or go to Urgent Care - possibly has kidney stone or pulled muscle - he does not feel like this is the case. I have explained that he can proceed with his echo or reschedule but again, I am not able to prescribe for flank pain and that he would need to see PCP or go to Urgent Care. A copy of his labs have been given to him.   Burtis Junes, RN, [facility] [address] [place], [place]  [address] [phone]

## 2018-05-19 NOTE — Patient Instructions (Addendum)
We will be checking the following labs today - BMET    Medication Instructions:    Continue with your current medicines. BUT  Ok to stay off the Aldactone for now - we will see what the echo shows and then decide about follow up  I refilled the Lasix today - but I want to cut this back to just one pill a day   If you need a refill on your cardiac medications before your next appointment, please call your pharmacy.     Testing/Procedures To Be Arranged:  N/A  Follow-Up:   See Dr. Delton See and her team in 4 to 6 weeks    At [facility], you and your health needs are our priority.  As part of our continuing mission to provide you with exceptional heart care, we have created designated Provider Care Teams.  These Care Teams include your primary Cardiologist (physician) and Advanced Practice Providers (APPs -  Physician Assistants and Nurse Practitioners) who all work together to provide you with the care you need, when you need it.  Special Instructions:  . Try to get a set of scales . Weigh daily . Restrict salt . Monitor your BP and keep a diary for [place] - bring to your next visit  Call the [facility] office at [phone] if you have any questions, problems or concerns.

## 2018-05-20 ENCOUNTER — Other Ambulatory Visit: Payer: Self-pay

## 2018-05-20 ENCOUNTER — Ambulatory Visit (HOSPITAL_COMMUNITY): Payer: BLUE CROSS/BLUE SHIELD | Attending: Cardiovascular Disease

## 2018-05-20 DIAGNOSIS — R079 Chest pain, unspecified: Secondary | ICD-10-CM

## 2018-05-20 DIAGNOSIS — I110 Hypertensive heart disease with heart failure: Secondary | ICD-10-CM | POA: Diagnosis not present

## 2018-05-21 DIAGNOSIS — D649 Anemia, unspecified: Secondary | ICD-10-CM | POA: Diagnosis not present

## 2018-05-21 DIAGNOSIS — M545 Low back pain: Secondary | ICD-10-CM | POA: Diagnosis not present

## 2018-05-21 DIAGNOSIS — I10 Essential (primary) hypertension: Secondary | ICD-10-CM | POA: Diagnosis not present

## 2018-05-21 DIAGNOSIS — D7589 Other specified diseases of blood and blood-forming organs: Secondary | ICD-10-CM | POA: Diagnosis not present

## 2018-05-23 DIAGNOSIS — D649 Anemia, unspecified: Secondary | ICD-10-CM | POA: Diagnosis not present

## 2018-05-23 DIAGNOSIS — Z8673 Personal history of transient ischemic attack (TIA), and cerebral infarction without residual deficits: Secondary | ICD-10-CM | POA: Diagnosis not present

## 2018-05-23 DIAGNOSIS — E1140 Type 2 diabetes mellitus with diabetic neuropathy, unspecified: Secondary | ICD-10-CM | POA: Diagnosis not present

## 2018-05-23 DIAGNOSIS — E785 Hyperlipidemia, unspecified: Secondary | ICD-10-CM | POA: Diagnosis not present

## 2018-05-23 DIAGNOSIS — D7589 Other specified diseases of blood and blood-forming organs: Secondary | ICD-10-CM | POA: Diagnosis not present

## 2018-05-26 ENCOUNTER — Encounter: Payer: Self-pay | Admitting: *Deleted

## 2018-05-26 DIAGNOSIS — D649 Anemia, unspecified: Secondary | ICD-10-CM | POA: Diagnosis not present

## 2018-05-27 DIAGNOSIS — M545 Low back pain: Secondary | ICD-10-CM | POA: Diagnosis not present

## 2018-06-09 DIAGNOSIS — R918 Other nonspecific abnormal finding of lung field: Secondary | ICD-10-CM | POA: Diagnosis not present

## 2018-06-09 DIAGNOSIS — J984 Other disorders of lung: Secondary | ICD-10-CM | POA: Diagnosis not present

## 2018-06-09 DIAGNOSIS — R9389 Abnormal findings on diagnostic imaging of other specified body structures: Secondary | ICD-10-CM | POA: Diagnosis not present

## 2018-06-16 DIAGNOSIS — R9389 Abnormal findings on diagnostic imaging of other specified body structures: Secondary | ICD-10-CM | POA: Diagnosis not present

## 2018-06-18 ENCOUNTER — Ambulatory Visit: Payer: BLUE CROSS/BLUE SHIELD | Admitting: Physician Assistant

## 2018-06-18 DIAGNOSIS — R0989 Other specified symptoms and signs involving the circulatory and respiratory systems: Secondary | ICD-10-CM

## 2018-06-18 DIAGNOSIS — E119 Type 2 diabetes mellitus without complications: Secondary | ICD-10-CM | POA: Diagnosis not present

## 2018-06-19 ENCOUNTER — Encounter: Payer: Self-pay | Admitting: Physician Assistant

## 2018-06-27 DIAGNOSIS — Z800 Family history of malignant neoplasm of digestive organs: Secondary | ICD-10-CM | POA: Diagnosis not present

## 2018-06-27 DIAGNOSIS — D649 Anemia, unspecified: Secondary | ICD-10-CM | POA: Diagnosis not present

## 2018-06-27 DIAGNOSIS — R195 Other fecal abnormalities: Secondary | ICD-10-CM | POA: Diagnosis not present

## 2018-07-12 ENCOUNTER — Emergency Department (HOSPITAL_COMMUNITY): Payer: BLUE CROSS/BLUE SHIELD

## 2018-07-12 ENCOUNTER — Inpatient Hospital Stay (HOSPITAL_COMMUNITY)
Admission: EM | Admit: 2018-07-12 | Discharge: 2018-07-17 | DRG: 956 | Disposition: A | Discharge status: Home / Self Care | Payer: BLUE CROSS/BLUE SHIELD | Attending: Student | Admitting: Student

## 2018-07-12 ENCOUNTER — Other Ambulatory Visit: Payer: Self-pay

## 2018-07-12 ENCOUNTER — Encounter (HOSPITAL_COMMUNITY): Payer: Self-pay | Admitting: Anesthesiology

## 2018-07-12 ENCOUNTER — Inpatient Hospital Stay (HOSPITAL_COMMUNITY): Payer: BLUE CROSS/BLUE SHIELD | Admitting: Anesthesiology

## 2018-07-12 ENCOUNTER — Inpatient Hospital Stay (HOSPITAL_COMMUNITY): Payer: BLUE CROSS/BLUE SHIELD

## 2018-07-12 ENCOUNTER — Encounter (HOSPITAL_COMMUNITY)
Admission: EM | Disposition: A | Discharge status: Home / Self Care | Payer: Self-pay | Source: Home / Self Care | Attending: Student

## 2018-07-12 DIAGNOSIS — Z7951 Long term (current) use of inhaled steroids: Secondary | ICD-10-CM | POA: Diagnosis not present

## 2018-07-12 DIAGNOSIS — Z7982 Long term (current) use of aspirin: Secondary | ICD-10-CM

## 2018-07-12 DIAGNOSIS — E1169 Type 2 diabetes mellitus with other specified complication: Secondary | ICD-10-CM

## 2018-07-12 DIAGNOSIS — S72009A Fracture of unspecified part of neck of unspecified femur, initial encounter for closed fracture: Secondary | ICD-10-CM | POA: Diagnosis present

## 2018-07-12 DIAGNOSIS — S0003XA Contusion of scalp, initial encounter: Secondary | ICD-10-CM | POA: Diagnosis not present

## 2018-07-12 DIAGNOSIS — S32401A Unspecified fracture of right acetabulum, initial encounter for closed fracture: Secondary | ICD-10-CM | POA: Diagnosis not present

## 2018-07-12 DIAGNOSIS — I5022 Chronic systolic (congestive) heart failure: Secondary | ICD-10-CM | POA: Diagnosis not present

## 2018-07-12 DIAGNOSIS — Z8249 Family history of ischemic heart disease and other diseases of the circulatory system: Secondary | ICD-10-CM | POA: Diagnosis not present

## 2018-07-12 DIAGNOSIS — Z419 Encounter for procedure for purposes other than remedying health state, unspecified: Secondary | ICD-10-CM

## 2018-07-12 DIAGNOSIS — E119 Type 2 diabetes mellitus without complications: Secondary | ICD-10-CM

## 2018-07-12 DIAGNOSIS — S72001A Fracture of unspecified part of neck of right femur, initial encounter for closed fracture: Secondary | ICD-10-CM | POA: Diagnosis not present

## 2018-07-12 DIAGNOSIS — S73011A Posterior subluxation of right hip, initial encounter: Secondary | ICD-10-CM | POA: Diagnosis not present

## 2018-07-12 DIAGNOSIS — W19XXXA Unspecified fall, initial encounter: Secondary | ICD-10-CM

## 2018-07-12 DIAGNOSIS — S0081XA Abrasion of other part of head, initial encounter: Secondary | ICD-10-CM | POA: Diagnosis not present

## 2018-07-12 DIAGNOSIS — Z79899 Other long term (current) drug therapy: Secondary | ICD-10-CM

## 2018-07-12 DIAGNOSIS — S32491D Other specified fracture of right acetabulum, subsequent encounter for fracture with routine healing: Secondary | ICD-10-CM | POA: Diagnosis not present

## 2018-07-12 DIAGNOSIS — W11XXXA Fall on and from ladder, initial encounter: Secondary | ICD-10-CM | POA: Diagnosis present

## 2018-07-12 DIAGNOSIS — Y92009 Unspecified place in unspecified non-institutional (private) residence as the place of occurrence of the external cause: Secondary | ICD-10-CM | POA: Diagnosis not present

## 2018-07-12 DIAGNOSIS — S32421A Displaced fracture of posterior wall of right acetabulum, initial encounter for closed fracture: Secondary | ICD-10-CM | POA: Diagnosis not present

## 2018-07-12 DIAGNOSIS — R2689 Other abnormalities of gait and mobility: Secondary | ICD-10-CM | POA: Diagnosis not present

## 2018-07-12 DIAGNOSIS — I43 Cardiomyopathy in diseases classified elsewhere: Secondary | ICD-10-CM | POA: Diagnosis not present

## 2018-07-12 DIAGNOSIS — G4733 Obstructive sleep apnea (adult) (pediatric): Secondary | ICD-10-CM | POA: Diagnosis present

## 2018-07-12 DIAGNOSIS — S73004A Unspecified dislocation of right hip, initial encounter: Secondary | ICD-10-CM

## 2018-07-12 DIAGNOSIS — E785 Hyperlipidemia, unspecified: Secondary | ICD-10-CM | POA: Diagnosis not present

## 2018-07-12 DIAGNOSIS — Z8673 Personal history of transient ischemic attack (TIA), and cerebral infarction without residual deficits: Secondary | ICD-10-CM | POA: Diagnosis not present

## 2018-07-12 DIAGNOSIS — I428 Other cardiomyopathies: Secondary | ICD-10-CM | POA: Diagnosis present

## 2018-07-12 DIAGNOSIS — S72059A Unspecified fracture of head of unspecified femur, initial encounter for closed fracture: Secondary | ICD-10-CM | POA: Diagnosis not present

## 2018-07-12 DIAGNOSIS — I10 Essential (primary) hypertension: Secondary | ICD-10-CM | POA: Diagnosis not present

## 2018-07-12 DIAGNOSIS — I110 Hypertensive heart disease with heart failure: Secondary | ICD-10-CM | POA: Diagnosis present

## 2018-07-12 DIAGNOSIS — Z794 Long term (current) use of insulin: Secondary | ICD-10-CM

## 2018-07-12 DIAGNOSIS — I5023 Acute on chronic systolic (congestive) heart failure: Secondary | ICD-10-CM | POA: Diagnosis present

## 2018-07-12 DIAGNOSIS — E1122 Type 2 diabetes mellitus with diabetic chronic kidney disease: Secondary | ICD-10-CM | POA: Diagnosis present

## 2018-07-12 DIAGNOSIS — Z720 Tobacco use: Secondary | ICD-10-CM | POA: Diagnosis present

## 2018-07-12 DIAGNOSIS — E1140 Type 2 diabetes mellitus with diabetic neuropathy, unspecified: Secondary | ICD-10-CM | POA: Diagnosis not present

## 2018-07-12 DIAGNOSIS — I119 Hypertensive heart disease without heart failure: Secondary | ICD-10-CM | POA: Diagnosis present

## 2018-07-12 DIAGNOSIS — I130 Hypertensive heart and chronic kidney disease with heart failure and stage 1 through stage 4 chronic kidney disease, or unspecified chronic kidney disease: Secondary | ICD-10-CM | POA: Diagnosis not present

## 2018-07-12 DIAGNOSIS — E7849 Other hyperlipidemia: Secondary | ICD-10-CM | POA: Diagnosis not present

## 2018-07-12 DIAGNOSIS — Z87891 Personal history of nicotine dependence: Secondary | ICD-10-CM

## 2018-07-12 DIAGNOSIS — S72051A Unspecified fracture of head of right femur, initial encounter for closed fracture: Secondary | ICD-10-CM | POA: Diagnosis not present

## 2018-07-12 DIAGNOSIS — N183 Chronic kidney disease, stage 3 (moderate): Secondary | ICD-10-CM | POA: Diagnosis present

## 2018-07-12 DIAGNOSIS — D62 Acute posthemorrhagic anemia: Secondary | ICD-10-CM | POA: Diagnosis present

## 2018-07-12 DIAGNOSIS — S32441A Displaced fracture of posterior column [ilioischial] of right acetabulum, initial encounter for closed fracture: Secondary | ICD-10-CM | POA: Diagnosis not present

## 2018-07-12 DIAGNOSIS — S199XXA Unspecified injury of neck, initial encounter: Secondary | ICD-10-CM | POA: Diagnosis not present

## 2018-07-12 DIAGNOSIS — S32409A Unspecified fracture of unspecified acetabulum, initial encounter for closed fracture: Secondary | ICD-10-CM

## 2018-07-12 DIAGNOSIS — I5043 Acute on chronic combined systolic (congestive) and diastolic (congestive) heart failure: Secondary | ICD-10-CM | POA: Diagnosis not present

## 2018-07-12 DIAGNOSIS — R0609 Other forms of dyspnea: Secondary | ICD-10-CM | POA: Diagnosis present

## 2018-07-12 DIAGNOSIS — W132XXA Fall from, out of or through roof, initial encounter: Secondary | ICD-10-CM | POA: Diagnosis present

## 2018-07-12 DIAGNOSIS — S72091A Other fracture of head and neck of right femur, initial encounter for closed fracture: Secondary | ICD-10-CM | POA: Diagnosis not present

## 2018-07-12 DIAGNOSIS — J45909 Unspecified asthma, uncomplicated: Secondary | ICD-10-CM | POA: Diagnosis not present

## 2018-07-12 HISTORY — PX: INSERTION OF TRACTION PIN: SHX6560

## 2018-07-12 HISTORY — PX: HIP CLOSED REDUCTION: SHX983

## 2018-07-12 LAB — COMPREHENSIVE METABOLIC PANEL
ALT: 21 U/L (ref 0–44)
AST: 33 U/L (ref 15–41)
Albumin: 3.9 g/dL (ref 3.5–5.0)
Alkaline Phosphatase: 56 U/L (ref 38–126)
Anion gap: 7 (ref 5–15)
BUN: 12 mg/dL (ref 6–20)
CO2: 24 mmol/L (ref 22–32)
Calcium: 8.5 mg/dL — ABNORMAL LOW (ref 8.9–10.3)
Chloride: 105 mmol/L (ref 98–111)
Creatinine, Ser: 0.97 mg/dL (ref 0.61–1.24)
GFR calc non Af Amer: >60 mL/min (ref >60)
Glucose, Bld: 89 mg/dL (ref 70–99)
Potassium: 3.8 mmol/L (ref 3.5–5.1)
Sodium: 136 mmol/L (ref 135–145)
Total Bilirubin: 1.5 mg/dL — ABNORMAL HIGH (ref 0.3–1.2)
Total Protein: 6.6 g/dL (ref 6.5–8.1)

## 2018-07-12 LAB — CBC WITH DIFFERENTIAL/PLATELET
Abs Immature Granulocytes: 0.04 10*3/uL (ref 0.00–0.07)
Basophils Absolute: 0 10*3/uL (ref 0.0–0.1)
Basophils Relative: 0 %
Eosinophils Absolute: 0 10*3/uL (ref 0.0–0.5)
Eosinophils Relative: 0 %
HCT: 42.9 % (ref 39.0–52.0)
Hemoglobin: 14.1 g/dL (ref 13.0–17.0)
Immature Granulocytes: 0 %
LYMPHS ABS: 1.2 10*3/uL (ref 0.7–4.0)
Lymphocytes Relative: 12 %
MCH: 31.4 pg (ref 26.0–34.0)
MCHC: 32.9 g/dL (ref 30.0–36.0)
MCV: 95.5 fL (ref 80.0–100.0)
MONOS PCT: 9 %
Monocytes Absolute: 0.9 10*3/uL (ref 0.1–1.0)
Neutro Abs: 7.9 10*3/uL — ABNORMAL HIGH (ref 1.7–7.7)
Neutrophils Relative %: 79 %
Platelets: 211 10*3/uL (ref 150–400)
RBC: 4.49 MIL/uL (ref 4.22–5.81)
RDW: 11.9 % (ref 11.5–15.5)
WBC: 10 10*3/uL (ref 4.0–10.5)
nRBC: 0 % (ref 0.0–0.2)

## 2018-07-12 LAB — BASIC METABOLIC PANEL
Anion gap: 8 (ref 5–15)
BUN: 14 mg/dL (ref 6–20)
CO2: 23 mmol/L (ref 22–32)
Calcium: 8.6 mg/dL — ABNORMAL LOW (ref 8.9–10.3)
Chloride: 103 mmol/L (ref 98–111)
Creatinine, Ser: 1.06 mg/dL (ref 0.61–1.24)
GFR calc Af Amer: >60 mL/min (ref >60)
GFR calc non Af Amer: >60 mL/min (ref >60)
GLUCOSE: 88 mg/dL (ref 70–99)
Potassium: 3.5 mmol/L (ref 3.5–5.1)
Sodium: 134 mmol/L — ABNORMAL LOW (ref 135–145)

## 2018-07-12 LAB — GLUCOSE, CAPILLARY
Glucose-Capillary: 121 mg/dL — ABNORMAL HIGH (ref 70–99)
Glucose-Capillary: 87 mg/dL (ref 70–99)

## 2018-07-12 SURGERY — CLOSED REDUCTION, HIP
Anesthesia: General | Laterality: Right

## 2018-07-12 MED ORDER — MORPHINE SULFATE (PF) 2 MG/ML IV SOLN
2.0000 mg | INTRAVENOUS | Status: DC | PRN
  Administered 2018-07-12 – 2018-07-15 (×8): 2 mg via INTRAVENOUS
  Filled 2018-07-12 (×8): qty 1

## 2018-07-12 MED ORDER — ACETAMINOPHEN 325 MG PO TABS
650.0000 mg | ORAL_TABLET | Freq: Four times a day (QID) | ORAL | Status: DC | PRN
  Administered 2018-07-13 – 2018-07-14 (×2): 650 mg via ORAL
  Filled 2018-07-12 (×2): qty 2

## 2018-07-12 MED ORDER — CHLORHEXIDINE GLUCONATE 4 % EX LIQD: 60.0000 mL | Freq: Once | CUTANEOUS | Status: DC

## 2018-07-12 MED ORDER — ADULT MULTIVITAMIN W/MINERALS CH
1.0000 | ORAL_TABLET | Freq: Every day | ORAL | Status: DC
  Administered 2018-07-13 – 2018-07-17 (×4): 1 via ORAL
  Filled 2018-07-12 (×4): qty 1

## 2018-07-12 MED ORDER — SPIRONOLACTONE 25 MG PO TABS
50.0000 mg | ORAL_TABLET | Freq: Every day | ORAL | Status: DC
  Administered 2018-07-14 – 2018-07-17 (×3): 50 mg via ORAL
  Filled 2018-07-12: qty 2
  Filled 2018-07-12: qty 1
  Filled 2018-07-12 (×2): qty 2
  Filled 2018-07-12: qty 1
  Filled 2018-07-12 (×2): qty 2

## 2018-07-12 MED ORDER — NICOTINE 21 MG/24HR TD PT24
21.0000 mg | MEDICATED_PATCH | Freq: Every day | TRANSDERMAL | Status: DC
  Filled 2018-07-12 (×3): qty 1

## 2018-07-12 MED ORDER — FENTANYL CITRATE (PF) 250 MCG/5ML IJ SOLN
INTRAMUSCULAR | Status: DC | PRN
  Administered 2018-07-12: 50 ug via INTRAVENOUS

## 2018-07-12 MED ORDER — AMLODIPINE BESYLATE 5 MG PO TABS
5.0000 mg | ORAL_TABLET | Freq: Every day | ORAL | Status: DC
  Administered 2018-07-13 – 2018-07-17 (×5): 5 mg via ORAL
  Filled 2018-07-12 (×5): qty 1

## 2018-07-12 MED ORDER — ONDANSETRON HCL 4 MG/2ML IJ SOLN
INTRAMUSCULAR | Status: DC | PRN
  Administered 2018-07-12: 4 mg via INTRAVENOUS

## 2018-07-12 MED ORDER — LACTATED RINGERS IV SOLN
INTRAVENOUS | Status: DC | PRN
  Administered 2018-07-12: 20:00:00 via INTRAVENOUS

## 2018-07-12 MED ORDER — FENTANYL CITRATE (PF) 100 MCG/2ML IJ SOLN
25.0000 ug | INTRAMUSCULAR | Status: DC | PRN
  Administered 2018-07-12: 50 ug via INTRAVENOUS

## 2018-07-12 MED ORDER — CYCLOBENZAPRINE HCL 5 MG PO TABS: 5.0000 mg | ORAL_TABLET | ORAL | Status: DC | PRN

## 2018-07-12 MED ORDER — NITROGLYCERIN 0.4 MG SL SUBL: 0.4000 mg | SUBLINGUAL_TABLET | SUBLINGUAL | Status: DC | PRN

## 2018-07-12 MED ORDER — SODIUM CHLORIDE 0.9 % IV BOLUS
500.0000 mL | Freq: Once | INTRAVENOUS | Status: AC
  Administered 2018-07-12: 500 mL via INTRAVENOUS

## 2018-07-12 MED ORDER — METHOCARBAMOL 1000 MG/10ML IJ SOLN
500.0000 mg | Freq: Four times a day (QID) | INTRAVENOUS | Status: DC | PRN
  Filled 2018-07-12: qty 5

## 2018-07-12 MED ORDER — MIDAZOLAM HCL 2 MG/2ML IJ SOLN
INTRAMUSCULAR | Status: DC | PRN
  Administered 2018-07-12: 2 mg via INTRAVENOUS

## 2018-07-12 MED ORDER — ACETAMINOPHEN 650 MG RE SUPP: 650.0000 mg | Freq: Four times a day (QID) | RECTAL | Status: DC | PRN

## 2018-07-12 MED ORDER — PROPOFOL 10 MG/ML IV BOLUS
INTRAVENOUS | Status: AC
  Filled 2018-07-12: qty 20

## 2018-07-12 MED ORDER — ONDANSETRON HCL 4 MG PO TABS: 4.0000 mg | ORAL_TABLET | Freq: Four times a day (QID) | ORAL | Status: DC | PRN

## 2018-07-12 MED ORDER — LIDOCAINE 2% (20 MG/ML) 5 ML SYRINGE
INTRAMUSCULAR | Status: AC
  Filled 2018-07-12: qty 5

## 2018-07-12 MED ORDER — METHOCARBAMOL 500 MG PO TABS
500.0000 mg | ORAL_TABLET | Freq: Four times a day (QID) | ORAL | Status: DC | PRN
  Administered 2018-07-14 – 2018-07-17 (×6): 500 mg via ORAL
  Filled 2018-07-12 (×6): qty 1

## 2018-07-12 MED ORDER — ALBUTEROL SULFATE (2.5 MG/3ML) 0.083% IN NEBU: 2.5000 mg | INHALATION_SOLUTION | RESPIRATORY_TRACT | Status: DC | PRN

## 2018-07-12 MED ORDER — PROPOFOL 10 MG/ML IV BOLUS
INTRAVENOUS | Status: DC | PRN
  Administered 2018-07-12: 200 mg via INTRAVENOUS

## 2018-07-12 MED ORDER — HYDROMORPHONE HCL 1 MG/ML IJ SOLN
1.0000 mg | Freq: Once | INTRAMUSCULAR | Status: AC
  Administered 2018-07-12: 1 mg via INTRAMUSCULAR

## 2018-07-12 MED ORDER — DIPHENHYDRAMINE HCL 12.5 MG/5ML PO ELIX
12.5000 mg | ORAL_SOLUTION | ORAL | Status: DC | PRN
  Administered 2018-07-13: 25 mg via ORAL
  Filled 2018-07-12: qty 10

## 2018-07-12 MED ORDER — POVIDONE-IODINE 10 % EX SWAB: 2.0000 "application " | Freq: Once | CUTANEOUS | Status: DC

## 2018-07-12 MED ORDER — PANTOPRAZOLE SODIUM 40 MG PO TBEC
40.0000 mg | DELAYED_RELEASE_TABLET | Freq: Every day | ORAL | Status: DC
  Administered 2018-07-12 – 2018-07-17 (×6): 40 mg via ORAL
  Filled 2018-07-12 (×6): qty 1

## 2018-07-12 MED ORDER — BUDESONIDE 0.25 MG/2ML IN SUSP
0.2500 mg | Freq: Two times a day (BID) | RESPIRATORY_TRACT | Status: DC
  Administered 2018-07-14 – 2018-07-16 (×2): 0.25 mg via RESPIRATORY_TRACT
  Filled 2018-07-12 (×9): qty 2

## 2018-07-12 MED ORDER — DOCUSATE SODIUM 100 MG PO CAPS
100.0000 mg | ORAL_CAPSULE | Freq: Two times a day (BID) | ORAL | Status: DC
  Administered 2018-07-12 – 2018-07-17 (×8): 100 mg via ORAL
  Filled 2018-07-12 (×9): qty 1

## 2018-07-12 MED ORDER — INSULIN ASPART 100 UNIT/ML ~~LOC~~ SOLN
0.0000 [IU] | Freq: Every day | SUBCUTANEOUS | Status: DC
  Administered 2018-07-14: 2 [IU] via SUBCUTANEOUS

## 2018-07-12 MED ORDER — OXYCODONE HCL 5 MG PO TABS
5.0000 mg | ORAL_TABLET | ORAL | Status: DC | PRN
  Administered 2018-07-12 – 2018-07-14 (×5): 10 mg via ORAL
  Filled 2018-07-12 (×6): qty 2

## 2018-07-12 MED ORDER — FENTANYL CITRATE (PF) 100 MCG/2ML IJ SOLN
INTRAMUSCULAR | Status: AC
  Filled 2018-07-12: qty 2

## 2018-07-12 MED ORDER — GABAPENTIN 300 MG PO CAPS
300.0000 mg | ORAL_CAPSULE | Freq: Two times a day (BID) | ORAL | Status: DC
  Administered 2018-07-12 – 2018-07-14 (×4): 300 mg via ORAL
  Filled 2018-07-12 (×4): qty 1

## 2018-07-12 MED ORDER — ENOXAPARIN SODIUM 40 MG/0.4ML ~~LOC~~ SOLN
40.0000 mg | SUBCUTANEOUS | Status: DC
  Administered 2018-07-13: 40 mg via SUBCUTANEOUS
  Filled 2018-07-12: qty 0.4

## 2018-07-12 MED ORDER — OMEGA-3-ACID ETHYL ESTERS 1 G PO CAPS
1.0000 g | ORAL_CAPSULE | Freq: Every day | ORAL | Status: DC
  Administered 2018-07-12 – 2018-07-17 (×5): 1 g via ORAL
  Filled 2018-07-12 (×5): qty 1

## 2018-07-12 MED ORDER — LIDOCAINE 2% (20 MG/ML) 5 ML SYRINGE
INTRAMUSCULAR | Status: DC | PRN
  Administered 2018-07-12: 60 mg via INTRAVENOUS

## 2018-07-12 MED ORDER — HYDROMORPHONE HCL 1 MG/ML IJ SOLN
1.0000 mg | Freq: Once | INTRAMUSCULAR | Status: AC
  Administered 2018-07-12: 1 mg via INTRAVENOUS
  Filled 2018-07-12: qty 1

## 2018-07-12 MED ORDER — NEBIVOLOL HCL 10 MG PO TABS
20.0000 mg | ORAL_TABLET | Freq: Every day | ORAL | Status: DC
  Administered 2018-07-12 – 2018-07-16 (×5): 20 mg via ORAL
  Filled 2018-07-12 (×7): qty 2

## 2018-07-12 MED ORDER — ONDANSETRON HCL 4 MG/2ML IJ SOLN: 4.0000 mg | Freq: Four times a day (QID) | INTRAMUSCULAR | Status: DC | PRN

## 2018-07-12 MED ORDER — MIDAZOLAM HCL 2 MG/2ML IJ SOLN
INTRAMUSCULAR | Status: AC
  Filled 2018-07-12: qty 2

## 2018-07-12 MED ORDER — HYDRALAZINE HCL 50 MG PO TABS
100.0000 mg | ORAL_TABLET | Freq: Two times a day (BID) | ORAL | Status: DC
  Administered 2018-07-12 – 2018-07-17 (×10): 100 mg via ORAL
  Filled 2018-07-12 (×10): qty 2

## 2018-07-12 MED ORDER — FENTANYL CITRATE (PF) 250 MCG/5ML IJ SOLN
INTRAMUSCULAR | Status: AC
  Filled 2018-07-12: qty 5

## 2018-07-12 MED ORDER — ATORVASTATIN CALCIUM 40 MG PO TABS
40.0000 mg | ORAL_TABLET | Freq: Every day | ORAL | Status: DC
  Administered 2018-07-12 – 2018-07-17 (×5): 40 mg via ORAL
  Filled 2018-07-12 (×5): qty 1

## 2018-07-12 MED ORDER — ONDANSETRON HCL 4 MG/2ML IJ SOLN
INTRAMUSCULAR | Status: AC
  Filled 2018-07-12: qty 2

## 2018-07-12 MED ORDER — DEXAMETHASONE SODIUM PHOSPHATE 10 MG/ML IJ SOLN
INTRAMUSCULAR | Status: AC
  Filled 2018-07-12: qty 1

## 2018-07-12 MED ORDER — ONDANSETRON HCL 4 MG/2ML IJ SOLN
4.0000 mg | Freq: Once | INTRAMUSCULAR | Status: AC
  Administered 2018-07-12: 4 mg via INTRAVENOUS
  Filled 2018-07-12: qty 2

## 2018-07-12 MED ORDER — SUCCINYLCHOLINE CHLORIDE 200 MG/10ML IV SOSY
PREFILLED_SYRINGE | INTRAVENOUS | Status: DC | PRN
  Administered 2018-07-12: 120 mg via INTRAVENOUS

## 2018-07-12 MED ORDER — INSULIN ASPART 100 UNIT/ML ~~LOC~~ SOLN
0.0000 [IU] | Freq: Three times a day (TID) | SUBCUTANEOUS | Status: DC
  Administered 2018-07-13 (×2): 2 [IU] via SUBCUTANEOUS
  Administered 2018-07-14: 1 [IU] via SUBCUTANEOUS
  Administered 2018-07-15: 2 [IU] via SUBCUTANEOUS

## 2018-07-12 MED ORDER — HYDROMORPHONE HCL 1 MG/ML IJ SOLN
1.0000 mg | Freq: Once | INTRAMUSCULAR | Status: DC
  Filled 2018-07-12: qty 1

## 2018-07-12 MED ORDER — ALBUTEROL SULFATE HFA 108 (90 BASE) MCG/ACT IN AERS: 2.0000 | INHALATION_SPRAY | Freq: Every day | RESPIRATORY_TRACT | Status: DC | PRN

## 2018-07-12 MED ORDER — ONDANSETRON HCL 4 MG/2ML IJ SOLN: 4.0000 mg | Freq: Once | INTRAMUSCULAR | Status: DC | PRN

## 2018-07-12 MED ORDER — METHOCARBAMOL 500 MG PO TABS
500.0000 mg | ORAL_TABLET | Freq: Two times a day (BID) | ORAL | Status: DC
  Administered 2018-07-12 – 2018-07-14 (×3): 500 mg via ORAL
  Filled 2018-07-12 (×4): qty 1

## 2018-07-12 MED ORDER — ROCURONIUM BROMIDE 50 MG/5ML IV SOSY
PREFILLED_SYRINGE | INTRAVENOUS | Status: AC
  Filled 2018-07-12: qty 5

## 2018-07-12 MED ORDER — ISOSORBIDE MONONITRATE ER 60 MG PO TB24
120.0000 mg | ORAL_TABLET | Freq: Every day | ORAL | Status: DC
  Administered 2018-07-12 – 2018-07-17 (×6): 120 mg via ORAL
  Filled 2018-07-12 (×6): qty 2

## 2018-07-12 SURGICAL SUPPLY — 3 items
CHLORAPREP W/TINT 10.5 ML (MISCELLANEOUS) ×2 IMPLANT
DRSG XEROFORM 1X8 (GAUZE/BANDAGES/DRESSINGS) ×2 IMPLANT
PIN STIENMAN 2.0 (PIN) ×2 IMPLANT

## 2018-07-12 NOTE — ED Triage Notes (Signed)
Pt came in via POV after a fall earlier today. Pt reports he was cleaning the gutters at the top of his two story house when he fell off the ladder and hit his right hip and head. Pt's brother helped him to the ED. Pt is complaining of right hip pain and a head laceration with a hematoma present on his forehead. Pt is alert and oriented x4 upon arrival to the ED. Pt complaining of 10/10 hip pain and denies any loss of consciousness.

## 2018-07-12 NOTE — Consult Note (Signed)
Medical Consultation   Joseph Shannon  JXB:[id number]  DOB: Apr 26, 1981  DOA: 07/12/2018  PCP: Eather Colas, FNP   Outpatient Specialists: None   Requesting physician: Dr. Dub Mikes, orthopedic surgery  Reason for consultation: Medical management of diabetes and heart disease   History of Present Illness: Joseph Shannon is an 38 y.o. male with history of hypertensive cardiomyopathy with diastolic and systolic dysfunction, diabetes, hyperlipidemia, asthma, previous TIAs who was apparently Cleaning the gutters of a second story building at home when he fell off the ladder and fell on his right side.  He immediately felt pain on the side.  Patient was brought to the ER where he was found to have right hip fracture.  He did not hit his head and no loss of consciousness.  Patient denied any neurologic deficit.  Pain was 8 8 out of 10 on arrival.  Not able to put weight on the leg.  He has been seen by orthopedics and had close reduction of his fracture.  Postoperatively he is recuperating and medicine is consulted for medical management.  He has nonischemic cardiomyopathy obstructive sleep apnea among other things also being a diabetic and hypertensive he will need medical management while in the hospital.       Review of Systems:  Review of Systems  Constitutional: Negative.   HENT: Negative.   Eyes: Negative.   Respiratory: Negative.   Cardiovascular: Negative.   Gastrointestinal: Negative.   Genitourinary: Negative.   Musculoskeletal: Positive for joint pain and myalgias.  Skin: Negative.   Neurological: Negative.   Endo/Heme/Allergies: Negative.   Psychiatric/Behavioral: Negative.    As per HPI otherwise 10 point review of systems negative.    Past Medical History: Past Medical History:  Diagnosis Date   Asthma    Chronic combined systolic and diastolic CHF (congestive heart failure) (HCC)    Diabetes mellitus without complication (HCC)     Hypertension    NICM (nonischemic cardiomyopathy) (HCC)    Normal coronary arteries    OSA (obstructive sleep apnea)    TIA (transient ischemic attack) 2014    Past Surgical History: Past Surgical History:  Procedure Laterality Date   NO PAST SURGERIES       Allergies:   Allergies  Allergen Reactions   Naproxen Sodium Nausea And Vomiting     Social History:  reports that he quit smoking about 12 months ago. His smoking use included cigarettes. He smoked 0.25 packs per day. He has never used smokeless tobacco. He reports current alcohol use. He reports that he does not use drugs.   Family History: Family History  Problem Relation Age of Onset   Hypertension Mother    Heart disease Mother    Hypertension Father    Heart disease Father    Heart disease Paternal Uncle    Heart disease Maternal Grandmother      Physical Exam: Vitals:   07/12/18 2030 07/12/18 2045 07/12/18 2100 07/12/18 2131  BP: (!) 152/105 (!) 151/108 (!) 150/104 (!) 157/105  Pulse: 77 70 68 76  Resp: 15 13 18 15   Temp:   98.6 F (37 C) 98.1 F (36.7 C)  TempSrc:    Oral  SpO2: 98% 98% 97% 100%    Constitutional:   Alert and awake, oriented x3, not in any acute distress. Eyes: PERLA, EOMI, irises appear normal, anicteric sclera,  ENMT: external ears and nose appear normal,  Lips appears normal, oropharynx mucosa, tongue, posterior pharynx appear normal  Neck: neck appears normal, no masses, normal ROM, no thyromegaly, no JVD  CVS: S1-S2 clear, no murmur rubs or gallops, no LE edema, normal pedal pulses  Respiratory:  clear to auscultation bilaterally, no wheezing, rales or rhonchi. Respiratory effort normal. No accessory muscle use.  Abdomen: soft nontender, nondistended, normal bowel sounds, no hepatosplenomegaly, no hernias  Musculoskeletal: : Right hip dressed postoperatively, no cyanosis, clubbing or edema noted bilaterally Neuro: Cranial nerves II-XII intact,  strength, sensation, reflexes Psych: judgement and insight appear normal, stable mood and affect, mental status Skin: no rashes or lesions or ulcers, no induration or nodules   Data reviewed:  I have personally reviewed following labs and imaging studies Labs:  CBC: Recent Labs  Lab 07/12/18 1416  WBC 10.0  NEUTROABS 7.9*  HGB 14.1  HCT 42.9  MCV 95.5  PLT 211    Basic Metabolic Panel: Recent Labs  Lab 07/12/18 1416 07/12/18 1644  NA 134* 136  K 3.5 3.8  CL 103 105  CO2 23 24  GLUCOSE 88 89  BUN 14 12  CREATININE 1.06 0.97  CALCIUM 8.6* 8.5*   GFR CrCl cannot be calculated (Unknown ideal weight.). Liver Function Tests: Recent Labs  Lab 07/12/18 1644  AST 33  ALT 21  ALKPHOS 56  BILITOT 1.5*  PROT 6.6  ALBUMIN 3.9   No results for input(s): LIPASE, AMYLASE in the last 168 hours. No results for input(s): AMMONIA in the last 168 hours. Coagulation profile No results for input(s): INR, PROTIME in the last 168 hours.  Cardiac Enzymes: No results for input(s): CKTOTAL, CKMB, CKMBINDEX, TROPONINI in the last 168 hours. BNP: Invalid input(s): POCBNP CBG: Recent Labs  Lab 07/12/18 2021  GLUCAP 87   D-Dimer No results for input(s): DDIMER in the last 72 hours. Hgb A1c No results for input(s): HGBA1C in the last 72 hours. Lipid Profile No results for input(s): CHOL, HDL, LDLCALC, TRIG, CHOLHDL, LDLDIRECT in the last 72 hours. Thyroid function studies No results for input(s): TSH, T4TOTAL, T3FREE, THYROIDAB in the last 72 hours.  Invalid input(s): FREET3 Anemia work up No results for input(s): VITAMINB12, FOLATE, FERRITIN, TIBC, IRON, RETICCTPCT in the last 72 hours. Urinalysis    Component Value Date/Time   COLORURINE YELLOW 11/20/2014 1410   APPEARANCEUR CLEAR 11/20/2014 1410   LABSPEC 1.034 (H) 11/20/2014 1410   PHURINE 6.0 11/20/2014 1410   GLUCOSEU 100 (A) 11/20/2014 1410   HGBUR NEGATIVE 11/20/2014 1410   BILIRUBINUR NEGATIVE 11/20/2014 1410     KETONESUR 15 (A) 11/20/2014 1410   PROTEINUR NEGATIVE 11/20/2014 1410   UROBILINOGEN 0.2 11/20/2014 1410   NITRITE NEGATIVE 11/20/2014 1410   LEUKOCYTESUR NEGATIVE 11/20/2014 1410     Sepsis Labs Invalid input(s): PROCALCITONIN,  WBC,  LACTICIDVEN Microbiology No results found for this or any previous visit (from the past 240 hour(s)).     Inpatient Medications:   Scheduled Meds:  [START ON 07/13/2018] amLODipine  5 mg Oral Daily   atorvastatin  40 mg Oral Daily   budesonide  0.25 mg Nebulization BID   docusate sodium  100 mg Oral BID   enoxaparin (LOVENOX) injection  40 mg Subcutaneous Q24H   fentaNYL       gabapentin  300 mg Oral BID   hydrALAZINE  100 mg Oral BID   isosorbide mononitrate  120 mg Oral Daily   methocarbamol  500 mg Oral BID   [START ON 07/13/2018] multivitamin with minerals  1 tablet Oral Daily   nebivolol  20 mg Oral Daily   omega-3 acid ethyl esters  1 g Oral Daily   pantoprazole  40 mg Oral Daily   [START ON 07/13/2018] spironolactone  50 mg Oral Daily   Continuous Infusions:  methocarbamol (ROBAXIN) IV       Radiological Exams on Admission: Ct Head Wo Contrast  Result Date: 07/12/2018 CLINICAL DATA:  Larey Seat.  Hit head. EXAM: CT HEAD WITHOUT CONTRAST CT CERVICAL SPINE WITHOUT CONTRAST TECHNIQUE: Multidetector CT imaging of the head and cervical spine was performed following the standard protocol without intravenous contrast. Multiplanar CT image reconstructions of the cervical spine were also generated. COMPARISON:  Head CT 01/22/2018 FINDINGS: CT HEAD FINDINGS Brain: The ventricles are normal in size and configuration. No extra-axial fluid collections are identified. The gray-white differentiation is maintained. No CT findings for acute hemispheric infarction or intracranial hemorrhage. No mass lesions. The brainstem and cerebellum are normal. Vascular: No hyperdense vessels or obvious aneurysm. Skull: No acute skull fracture.  No bone  lesion. Sinuses/Orbits: The paranasal sinuses and mastoid air cells are clear. The globes are intact. Other: A frontal scalp hematoma is noted. No underlying skull fracture. CT CERVICAL SPINE FINDINGS Alignment: Normal. Skull base and vertebrae: No acute fracture. The facets are normally aligned. No facet or laminar fractures. Soft tissues and spinal canal: No prevertebral fluid or swelling. No visible canal hematoma. Disc levels: The spinal canal is generous. No spinal stenosis. No large disc protrusions or foraminal stenosis. Upper chest: Not covered. Other: No neck mass, adenopathy or hematoma. IMPRESSION: 1. No acute intracranial findings or skull fracture. 2. Normal alignment of the cervical vertebral bodies and no acute cervical spine fracture. 3. Frontal scalp hematoma but no underlying fracture or radiopaque foreign body. Electronically Signed   By: Rudie Meyer M.D.   On: 07/12/2018 14:17   Ct Cervical Spine Wo Contrast  Result Date: 07/12/2018 CLINICAL DATA:  Larey Seat.  Hit head. EXAM: CT HEAD WITHOUT CONTRAST CT CERVICAL SPINE WITHOUT CONTRAST TECHNIQUE: Multidetector CT imaging of the head and cervical spine was performed following the standard protocol without intravenous contrast. Multiplanar CT image reconstructions of the cervical spine were also generated. COMPARISON:  Head CT 01/22/2018 FINDINGS: CT HEAD FINDINGS Brain: The ventricles are normal in size and configuration. No extra-axial fluid collections are identified. The gray-white differentiation is maintained. No CT findings for acute hemispheric infarction or intracranial hemorrhage. No mass lesions. The brainstem and cerebellum are normal. Vascular: No hyperdense vessels or obvious aneurysm. Skull: No acute skull fracture.  No bone lesion. Sinuses/Orbits: The paranasal sinuses and mastoid air cells are clear. The globes are intact. Other: A frontal scalp hematoma is noted. No underlying skull fracture. CT CERVICAL SPINE FINDINGS Alignment:  Normal. Skull base and vertebrae: No acute fracture. The facets are normally aligned. No facet or laminar fractures. Soft tissues and spinal canal: No prevertebral fluid or swelling. No visible canal hematoma. Disc levels: The spinal canal is generous. No spinal stenosis. No large disc protrusions or foraminal stenosis. Upper chest: Not covered. Other: No neck mass, adenopathy or hematoma. IMPRESSION: 1. No acute intracranial findings or skull fracture. 2. Normal alignment of the cervical vertebral bodies and no acute cervical spine fracture. 3. Frontal scalp hematoma but no underlying fracture or radiopaque foreign body. Electronically Signed   By: Rudie Meyer M.D.   On: 07/12/2018 14:17   Dg Hip Port Unilat With Pelvis 1v Right  Result Date: 07/12/2018 CLINICAL DATA:  Status post  reduction of a right hip fracture/dislocation EXAM: DG HIP (WITH OR WITHOUT PELVIS) 1V PORT RIGHT COMPARISON:  Earlier today. FINDINGS: The previously described right hip fracture/dislocation has been reduced with the exception of superiorly displaced fragments laterally. IMPRESSION: Status post reduction of the previously described right hip fracture/dislocation. Electronically Signed   By: Beckie Salts M.D.   On: 07/12/2018 20:09   Dg Hip Unilat W Or Wo Pelvis 2-3 Views Right  Result Date: 07/12/2018 CLINICAL DATA:  Larey Seat earlier today.  Right hip pain. EXAM: DG HIP (WITH OR WITHOUT PELVIS) 2-3V RIGHT COMPARISON:  None. FINDINGS: There is a fracture dislocation involving the right hip. The hip is dislocated superiorly and posteriorly fracture fragments are noted above and below the acetabulum. CT may be helpful for further evaluation once the hip is relocated. The left hip is intact. The pubic symphysis and SI joints are intact. No pelvic fractures. IMPRESSION: Fracture dislocation of the right hip. Electronically Signed   By: Rudie Meyer M.D.   On: 07/12/2018 14:04   Dg Femur Min 2 Views Right  Result Date:  07/12/2018 CLINICAL DATA:  Larey Seat from roof.  Right hip pain. EXAM: RIGHT FEMUR 2 VIEWS COMPARISON:  None. FINDINGS: Fracture dislocation of the right hip is noted. The right femur is intact. No femoral shaft fracture. The knee joint is maintained. IMPRESSION: Fracture dislocation of the right hip. No femoral shaft fracture. Electronically Signed   By: Rudie Meyer M.D.   On: 07/12/2018 14:04    Impression/Recommendations Principal Problem:   Closed right hip fracture (HCC) Active Problems:   Hypertension   DOE (dyspnea on exertion)   DM (diabetes mellitus) (HCC)   Acute on chronic systolic heart failure (HCC)   Hypertensive heart disease   Tobacco use   Hyperlipidemia   Hip fracture (HCC)  #1 diabetes: Blood sugar appears controlled.  Will initiate sliding scale insulin and continue his home regimen.  #2 right hip fracture: Continue per orthopedics.  Close reduction has been done.  #3 hypertension again continue home regimen and monitor closely.  Hold diuretics.  #4 hyperlipidemia: Continue with statin.  #5 chronic systolic heart failure: Avoid excessive fluids.  Monitor on telemetry.  #6 tobacco abuse: Counseling provided.  Nicotine patch added.   Thank you for this consultation.  Our [facility] hospitalist team will follow the patient with you.   Time Spent: 58 minutes  Kasper Mudrick,LAWAL M.D. [facility] 07/12/2018, 10:25 PM

## 2018-07-12 NOTE — ED Provider Notes (Signed)
[facility] EMERGENCY DEPARTMENT Provider Note   CSN: [id number] Arrival date & time: 07/12/18  1224    History   Chief Complaint Chief Complaint  Patient presents with   Fall    HPI Joseph Shannon is a 38 y.o. male.     Level 5 caveat for acuity of condition.  Patient was apparently cleaning the gutters of the second story when he fell off his ladder.  He now complains of right lateral hip pain and a scrape of his left forehead.  No loss of consciousness or neurological deficits.  Neck is sore.  No other obvious extremity injuries.     Past Medical History:  Diagnosis Date   Asthma    Chronic combined systolic and diastolic CHF (congestive heart failure) (HCC)    Diabetes mellitus without complication ([place])    Hypertension    NICM (nonischemic cardiomyopathy) ([place])    Normal coronary arteries    OSA (obstructive sleep apnea)    TIA (transient ischemic attack) 2014    Patient Active Problem List   Diagnosis Date Noted   Hyperlipidemia 07/24/2016   History of TIA (transient ischemic attack) 07/13/2016   Fever blister 06/12/2016   Gastroenteritis 06/12/2016   Mild intermittent asthma with exacerbation 06/12/2016   Tobacco use 03/26/2016   Diverticulitis of intestine without perforation or abscess without bleeding 12/20/2015   Family history of malignant neoplasm of colon in first degree relative diagnosed when younger than 38 years of age 79/22/2017   Hypertensive heart disease 08/31/2015   Sweating 08/31/2015   Sinus tarsitis of left foot 07/28/2015   Acute on chronic systolic heart failure ([place]) 09/09/2014   Sleep apnea 09/09/2014   Diabetes mellitus ([place]) 03/08/2014   DM (diabetes mellitus) ([place]) 08/20/2013   Chest pain 08/19/2013   Paresthesias 08/19/2013   DOE (dyspnea on exertion) 08/19/2013   Hypertensive urgency 09/19/2012   Pineal gland cyst 09/19/2012   Temporary cerebral vascular dysfunction  09/18/2012   Hypertension    Asthma     Past Surgical History:  Procedure Laterality Date   NO PAST SURGERIES          Home Medications    Prior to Admission medications   Medication Sig Start Date End Date Taking? Authorizing Provider  albuterol (PROVENTIL HFA;VENTOLIN HFA) 108 (90 BASE) MCG/ACT inhaler Inhale 2 puffs into the lungs daily as needed for wheezing or shortness of breath.    Yes [provider]  albuterol (PROVENTIL) (2.5 MG/3ML) 0.083% nebulizer solution Take 2.5 mg by nebulization every 4 (four) hours as needed for wheezing or shortness of breath.   Yes [provider]  aspirin EC 81 MG tablet Take 1 tablet (81 mg total) daily by mouth. 03/14/17  Yes Dunn, Dayna N, PA-C  atorvastatin (LIPITOR) 40 MG tablet Take 40 mg by mouth daily.   Yes [provider]  cyclobenzaprine (FLEXERIL) 5 MG tablet Take 5 mg by mouth as needed for muscle spasms. 11/21/17  Yes [provider]  fluticasone (FLOVENT HFA) 110 MCG/ACT inhaler Inhale 3 puffs into the lungs 3 (three) times daily.   Yes [provider]  furosemide (LASIX) 40 MG tablet Take 1 tablet (40 mg total) by mouth daily. Take 40 mg at 8 am and 40 mg at 2 pm. Patient taking differently: Take 40 mg by mouth 2 (two) times daily. Take 40 mg at 8 am and 40 mg at 2 pm. 05/19/18  Yes Burtis Junes, NP  gabapentin (NEURONTIN) 300 MG  capsule Take 300 mg by mouth 2 (two) times daily.   Yes [provider]  glucose monitoring kit (FREESTYLE) monitoring kit 1 each by Does not apply route as needed for other. Dispense any model that is covered- dispense testing supplies for Q AC/ HS accuchecks- 1 month supply with one refil. 08/20/13  Yes Advani, Vernon Prey, MD  hydrALAZINE (APRESOLINE) 100 MG tablet Take 100 mg by mouth 2 (two) times daily.   Yes [provider]  isosorbide mononitrate (IMDUR) 120 MG 24 hr tablet Take 1 tablet (120 mg total) by mouth daily. 07/29/17  Yes Dorothy Spark, MD  lisinopril (PRINIVIL,ZESTRIL) 40 MG tablet Take 1 tablet (40 mg total) by mouth daily. 07/29/17  Yes Dorothy Spark, MD  metFORMIN (GLUCOPHAGE) 500 MG tablet Take 1 tablet (500 mg total) by mouth 2 (two) times daily with a meal. 07/29/17  Yes Dorothy Spark, MD  methocarbamol (ROBAXIN) 500 MG tablet Take 1 tablet (500 mg total) by mouth 2 (two) times daily. 01/22/18  Yes Volanda Napoleon, PA-C  Multiple Vitamin (MULTIVITAMIN) tablet Take 1 tablet by mouth daily.   Yes [provider]  Nebivolol HCl (BYSTOLIC) 20 MG TABS TAKE 1 TABLET BY MOUTH DAILY AT BEDTIME 04/08/18  Yes Dorothy Spark, MD  nitroGLYCERIN (NITROSTAT) 0.4 MG SL tablet Place 1 tablet (0.4 mg total) under the tongue every 5 (five) minutes as needed for chest pain. 02/20/13  Yes Dorothy Spark, MD  Omega-3 Fatty Acids (FISH OIL) 1000 MG CAPS Take 1,000 mg by mouth every evening.   Yes [provider]  omeprazole (PRILOSEC) 20 MG capsule Take 1 capsule (20 mg total) by mouth daily. 08/31/15  Yes Dorothy Spark, MD  amLODipine (NORVASC) 5 MG tablet Take 1 tablet (5 mg total) by mouth daily. 08/07/17 05/19/18  Dorothy Spark, MD  spironolactone (ALDACTONE) 50 MG tablet Take 1 tablet (50 mg total) by mouth daily. 08/07/17 05/15/18  Dorothy Spark, MD    Family History Family History  Problem Relation Age of Onset   Hypertension Mother    Heart disease Mother    Hypertension Father    Heart disease Father    Heart disease Paternal Uncle    Heart disease Maternal Grandmother     Social History Social History   Tobacco Use   Smoking status: Former Smoker    Packs/day: 0.25    Types: Cigarettes    Last attempt to quit: 06/18/2017    Years since quitting: 1.0   Smokeless tobacco: Never Used  Substance Use Topics   Alcohol use: Yes    Comment: 3 beers and a shot per day- last use yesterday   Drug use: No     Allergies   Naproxen sodium   Review of  Systems Review of Systems  Unable to perform ROS: Acuity of condition     Physical Exam Updated Vital Signs BP (!) 180/108    Pulse 78    Temp 99.2 F (37.3 C) (Oral)    Resp 16    SpO2 93%   Physical Exam Vitals signs and nursing note reviewed.  Constitutional:      Appearance: He is well-developed.  HENT:     Head: Normocephalic.     Comments: Superficial abrasion left forehead Eyes:     Conjunctiva/sclera: Conjunctivae normal.  Neck:     Musculoskeletal: Neck supple.  Cardiovascular:     Rate and Rhythm: Normal rate and regular rhythm.  Pulmonary:  Effort: Pulmonary effort is normal.     Breath sounds: Normal breath sounds.  Abdominal:     General: Bowel sounds are normal.     Palpations: Abdomen is soft.  Musculoskeletal: Normal range of motion.     Comments: Pain right lateral hip.  Skin:    General: Skin is warm and dry.  Neurological:     Mental Status: He is alert and oriented to person, place, and time.  Psychiatric:        Behavior: Behavior normal.      ED Treatments / Results  Labs (all labs ordered are listed, but only abnormal results are displayed) Labs Reviewed  CBC WITH DIFFERENTIAL/PLATELET  BASIC METABOLIC PANEL    EKG None  Radiology Ct Head Wo Contrast  Result Date: 07/12/2018 CLINICAL DATA:  [place].  Hit head. EXAM: CT HEAD WITHOUT CONTRAST CT CERVICAL SPINE WITHOUT CONTRAST TECHNIQUE: Multidetector CT imaging of the head and cervical spine was performed following the standard protocol without intravenous contrast. Multiplanar CT image reconstructions of the cervical spine were also generated. COMPARISON:  Head CT 01/22/2018 FINDINGS: CT HEAD FINDINGS Brain: The ventricles are normal in size and configuration. No extra-axial fluid collections are identified. The gray-white differentiation is maintained. No CT findings for acute hemispheric infarction or intracranial hemorrhage. No mass lesions. The brainstem and cerebellum are normal.  Vascular: No hyperdense vessels or obvious aneurysm. Skull: No acute skull fracture.  No bone lesion. Sinuses/Orbits: The paranasal sinuses and mastoid air cells are clear. The globes are intact. Other: A frontal scalp hematoma is noted. No underlying skull fracture. CT CERVICAL SPINE FINDINGS Alignment: Normal. Skull base and vertebrae: No acute fracture. The facets are normally aligned. No facet or laminar fractures. Soft tissues and spinal canal: No prevertebral fluid or swelling. No visible canal hematoma. Disc levels: The spinal canal is generous. No spinal stenosis. No large disc protrusions or foraminal stenosis. Upper chest: Not covered. Other: No neck mass, adenopathy or hematoma. IMPRESSION: 1. No acute intracranial findings or skull fracture. 2. Normal alignment of the cervical vertebral bodies and no acute cervical spine fracture. 3. Frontal scalp hematoma but no underlying fracture or radiopaque foreign body. Electronically Signed   By: Marijo Sanes M.D.   On: 07/12/2018 14:17   Ct Cervical Spine Wo Contrast  Result Date: 07/12/2018 CLINICAL DATA:  [place].  Hit head. EXAM: CT HEAD WITHOUT CONTRAST CT CERVICAL SPINE WITHOUT CONTRAST TECHNIQUE: Multidetector CT imaging of the head and cervical spine was performed following the standard protocol without intravenous contrast. Multiplanar CT image reconstructions of the cervical spine were also generated. COMPARISON:  Head CT 01/22/2018 FINDINGS: CT HEAD FINDINGS Brain: The ventricles are normal in size and configuration. No extra-axial fluid collections are identified. The gray-white differentiation is maintained. No CT findings for acute hemispheric infarction or intracranial hemorrhage. No mass lesions. The brainstem and cerebellum are normal. Vascular: No hyperdense vessels or obvious aneurysm. Skull: No acute skull fracture.  No bone lesion. Sinuses/Orbits: The paranasal sinuses and mastoid air cells are clear. The globes are intact. Other: A frontal  scalp hematoma is noted. No underlying skull fracture. CT CERVICAL SPINE FINDINGS Alignment: Normal. Skull base and vertebrae: No acute fracture. The facets are normally aligned. No facet or laminar fractures. Soft tissues and spinal canal: No prevertebral fluid or swelling. No visible canal hematoma. Disc levels: The spinal canal is generous. No spinal stenosis. No large disc protrusions or foraminal stenosis. Upper chest: Not covered. Other: No neck mass, adenopathy or hematoma.  IMPRESSION: 1. No acute intracranial findings or skull fracture. 2. Normal alignment of the cervical vertebral bodies and no acute cervical spine fracture. 3. Frontal scalp hematoma but no underlying fracture or radiopaque foreign body. Electronically Signed   By: Marijo Sanes M.D.   On: 07/12/2018 14:17   Dg Hip Unilat W Or Wo Pelvis 2-3 Views Right  Result Date: 07/12/2018 CLINICAL DATA:  [place] earlier today.  Right hip pain. EXAM: DG HIP (WITH OR WITHOUT PELVIS) 2-3V RIGHT COMPARISON:  None. FINDINGS: There is a fracture dislocation involving the right hip. The hip is dislocated superiorly and posteriorly fracture fragments are noted above and below the acetabulum. CT may be helpful for further evaluation once the hip is relocated. The left hip is intact. The pubic symphysis and SI joints are intact. No pelvic fractures. IMPRESSION: Fracture dislocation of the right hip. Electronically Signed   By: Marijo Sanes M.D.   On: 07/12/2018 14:04   Dg Femur Min 2 Views Right  Result Date: 07/12/2018 CLINICAL DATA:  [place] from roof.  Right hip pain. EXAM: RIGHT FEMUR 2 VIEWS COMPARISON:  None. FINDINGS: Fracture dislocation of the right hip is noted. The right femur is intact. No femoral shaft fracture. The knee joint is maintained. IMPRESSION: Fracture dislocation of the right hip. No femoral shaft fracture. Electronically Signed   By: Marijo Sanes M.D.   On: 07/12/2018 14:04    Procedures Procedures (including critical care  time)  Medications Ordered in ED Medications  sodium chloride 0.9 % bolus 500 mL (has no administration in time range)  HYDROmorphone (DILAUDID) injection 1 mg (has no administration in time range)  ondansetron (ZOFRAN) injection 4 mg (has no administration in time range)  HYDROmorphone (DILAUDID) injection 1 mg (1 mg Intramuscular Given 07/12/18 1315)     Initial Impression / Assessment and Plan / ED Course  I have reviewed the triage vital signs and the nursing notes.  Pertinent labs & imaging results that were available during my care of the patient were reviewed by me and considered in my medical decision making (see chart for details).        Status post fall from roof resulting in right hip fracture dislocation.  Pain management, consult orthopedics, admit.  Final Clinical Impressions(s) / ED Diagnoses   Final diagnoses:  Fall, initial encounter  Closed fracture dislocation of right hip joint, initial encounter [facility])    ED Discharge Orders    None       Nat Christen, MD 07/12/18 1431

## 2018-07-12 NOTE — Transfer of Care (Signed)
Immediate Anesthesia Transfer of Care Note  Patient: Joseph Shannon  Procedure(s) Performed: CLOSED REDUCTION HIP (Right ) INSERTION OF TRACTION PIN (Right )  Patient Location: PACU  Anesthesia Type:General  Level of Consciousness: awake  Airway & Oxygen Therapy: Patient Spontanous Breathing and Patient connected to face mask oxygen  Post-op Assessment: Report given to RN and Post -op Vital signs reviewed and stable  Post vital signs: Reviewed and stable  Last Vitals:  Vitals Value Taken Time  BP 146/104 07/12/2018  8:18 PM  Temp    Pulse 85 07/12/2018  8:21 PM  Resp 17 07/12/2018  8:21 PM  SpO2 96 % 07/12/2018  8:21 PM  Vitals shown include unvalidated device data.  Last Pain:  Vitals:   07/12/18 1650  TempSrc: Oral  PainSc:          Complications: No apparent anesthesia complications

## 2018-07-12 NOTE — Anesthesia Procedure Notes (Signed)
Procedure Name: Intubation Date/Time: 07/12/2018 8:05 PM Performed by: Claudina Lick, CRNA Pre-anesthesia Checklist: Patient identified, Emergency Drugs available, Suction available, Patient being monitored and Timeout performed Patient Re-evaluated:Patient Re-evaluated prior to induction Oxygen Delivery Method: Circle system utilized Preoxygenation: Pre-oxygenation with 100% oxygen Induction Type: IV induction Ventilation: Mask ventilation without difficulty Laryngoscope Size: Miller and 2 Grade View: Grade I Tube type: Oral Tube size: 7.5 mm Number of attempts: 1 Airway Equipment and Method: Stylet Placement Confirmation: ETT inserted through vocal cords under direct vision and positive ETCO2 Secured at: 22 cm Tube secured with: Tape Dental Injury: Teeth and Oropharynx as per pre-operative assessment

## 2018-07-12 NOTE — ED Notes (Addendum)
ED TO INPATIENT HANDOFF REPORT  ED Nurse Name and Phone #:    S Name/Age/Gender Joseph Shannon 38 y.o. male Room/Bed: 046C/046C  Code Status   Code Status: Full Code  Home/SNF/Other Home Patient oriented to: self, place, time and situation Is this baseline? Yes   Triage Complete: Triage complete  Chief Complaint fall  Triage Note Pt came in via POV after a fall earlier today. Pt reports he was cleaning the gutters at the top of his two story house when he fell off the ladder and hit his right hip and head. Pt's brother helped him to the ED. Pt is complaining of right hip pain and a head laceration with a hematoma present on his forehead. Pt is alert and oriented x4 upon arrival to the ED. Pt complaining of 10/10 hip pain and denies any loss of consciousness.    Allergies Allergies  Allergen Reactions  . Naproxen Sodium Nausea And Vomiting    Level of Care/Admitting Diagnosis ED Disposition    ED Disposition Condition Comment   Admit  Hospital Area: [facility] [[id number]]  Level of Care: Med-Surg [16]  Diagnosis: Closed right hip fracture [facility]) [[id number]]  Admitting Physician: Terance Hart [[id number]]  Attending Physician: Terance Hart [[id number]]  Estimated length of stay: 3 - 4 days  Certification:: I certify this patient will need inpatient services for at least 2 midnights  PT Class (Do Not Modify): Inpatient [101]  PT Acc Code (Do Not Modify): Private [1]       B Medical/Surgery History Past Medical History:  Diagnosis Date  . Asthma   . Chronic combined systolic and diastolic CHF (congestive heart failure) (HCC)   . Diabetes mellitus without complication (HCC)   . Hypertension   . NICM (nonischemic cardiomyopathy) (HCC)   . Normal coronary arteries   . OSA (obstructive sleep apnea)   . TIA (transient ischemic attack) 2014   Past Surgical History:  Procedure Laterality Date  . NO PAST SURGERIES       A IV  Location/Drains/Wounds Patient Lines/Drains/Airways Status   Active Line/Drains/Airways    Name:   Placement date:   Placement time:   Site:   Days:   Peripheral IV 07/12/18 Left;Upper Forearm   07/12/18    1501    Forearm   less than 1          Intake/Output Last 24 hours No intake or output data in the 24 hours ending 07/12/18 1516  Labs/Imaging Results for orders placed or performed during the hospital encounter of 07/12/18 (from the past 48 hour(s))  CBC with Differential     Status: Abnormal   Collection Time: 07/12/18  2:16 PM  Result Value Ref Range   WBC 10.0 4.0 - 10.5 K/uL   RBC 4.49 4.22 - 5.81 MIL/uL   Hemoglobin 14.1 13.0 - 17.0 g/dL   HCT 88.8 91.6 - 94.5 %   MCV 95.5 80.0 - 100.0 fL   MCH 31.4 26.0 - 34.0 pg   MCHC 32.9 30.0 - 36.0 g/dL   RDW 03.8 88.2 - 80.0 %   Platelets 211 150 - 400 K/uL   nRBC 0.0 0.0 - 0.2 %   Neutrophils Relative % 79 %   Neutro Abs 7.9 (H) 1.7 - 7.7 K/uL   Lymphocytes Relative 12 %   Lymphs Abs 1.2 0.7 - 4.0 K/uL   Monocytes Relative 9 %   Monocytes Absolute 0.9 0.1 - 1.0 K/uL   Eosinophils Relative 0 %  Eosinophils Absolute 0.0 0.0 - 0.5 K/uL   Basophils Relative 0 %   Basophils Absolute 0.0 0.0 - 0.1 K/uL   Immature Granulocytes 0 %   Abs Immature Granulocytes 0.04 0.00 - 0.07 K/uL    Comment: Performed at [facility], [address] [place], [place] [address]  Basic metabolic panel     Status: Abnormal   Collection Time: 07/12/18  2:16 PM  Result Value Ref Range   Sodium 134 (L) 135 - 145 mmol/L   Potassium 3.5 3.5 - 5.1 mmol/L   Chloride 103 98 - 111 mmol/L   CO2 23 22 - 32 mmol/L   Glucose, Bld 88 70 - 99 mg/dL   BUN 14 6 - 20 mg/dL   Creatinine, Ser 6.06 0.61 - 1.24 mg/dL   Calcium 8.6 (L) 8.9 - 10.3 mg/dL   GFR calc non Af Amer >60 >60 mL/min   GFR calc Af Amer >60 >60 mL/min   Anion gap 8 5 - 15    Comment: Performed at [facility], [address] [place], [place] [address]   Ct Head Wo  Contrast  Result Date: 07/12/2018 CLINICAL DATA:  Larey Seat.  Hit head. EXAM: CT HEAD WITHOUT CONTRAST CT CERVICAL SPINE WITHOUT CONTRAST TECHNIQUE: Multidetector CT imaging of the head and cervical spine was performed following the standard protocol without intravenous contrast. Multiplanar CT image reconstructions of the cervical spine were also generated. COMPARISON:  Head CT 01/22/2018 FINDINGS: CT HEAD FINDINGS Brain: The ventricles are normal in size and configuration. No extra-axial fluid collections are identified. The gray-white differentiation is maintained. No CT findings for acute hemispheric infarction or intracranial hemorrhage. No mass lesions. The brainstem and cerebellum are normal. Vascular: No hyperdense vessels or obvious aneurysm. Skull: No acute skull fracture.  No bone lesion. Sinuses/Orbits: The paranasal sinuses and mastoid air cells are clear. The globes are intact. Other: A frontal scalp hematoma is noted. No underlying skull fracture. CT CERVICAL SPINE FINDINGS Alignment: Normal. Skull base and vertebrae: No acute fracture. The facets are normally aligned. No facet or laminar fractures. Soft tissues and spinal canal: No prevertebral fluid or swelling. No visible canal hematoma. Disc levels: The spinal canal is generous. No spinal stenosis. No large disc protrusions or foraminal stenosis. Upper chest: Not covered. Other: No neck mass, adenopathy or hematoma. IMPRESSION: 1. No acute intracranial findings or skull fracture. 2. Normal alignment of the cervical vertebral bodies and no acute cervical spine fracture. 3. Frontal scalp hematoma but no underlying fracture or radiopaque foreign body. Electronically Signed   By: Rudie Meyer M.D.   On: 07/12/2018 14:17   Ct Cervical Spine Wo Contrast  Result Date: 07/12/2018 CLINICAL DATA:  Larey Seat.  Hit head. EXAM: CT HEAD WITHOUT CONTRAST CT CERVICAL SPINE WITHOUT CONTRAST TECHNIQUE: Multidetector CT imaging of the head and cervical spine was  performed following the standard protocol without intravenous contrast. Multiplanar CT image reconstructions of the cervical spine were also generated. COMPARISON:  Head CT 01/22/2018 FINDINGS: CT HEAD FINDINGS Brain: The ventricles are normal in size and configuration. No extra-axial fluid collections are identified. The gray-white differentiation is maintained. No CT findings for acute hemispheric infarction or intracranial hemorrhage. No mass lesions. The brainstem and cerebellum are normal. Vascular: No hyperdense vessels or obvious aneurysm. Skull: No acute skull fracture.  No bone lesion. Sinuses/Orbits: The paranasal sinuses and mastoid air cells are clear. The globes are intact. Other: A frontal scalp hematoma is noted. No underlying skull fracture. CT  CERVICAL SPINE FINDINGS Alignment: Normal. Skull base and vertebrae: No acute fracture. The facets are normally aligned. No facet or laminar fractures. Soft tissues and spinal canal: No prevertebral fluid or swelling. No visible canal hematoma. Disc levels: The spinal canal is generous. No spinal stenosis. No large disc protrusions or foraminal stenosis. Upper chest: Not covered. Other: No neck mass, adenopathy or hematoma. IMPRESSION: 1. No acute intracranial findings or skull fracture. 2. Normal alignment of the cervical vertebral bodies and no acute cervical spine fracture. 3. Frontal scalp hematoma but no underlying fracture or radiopaque foreign body. Electronically Signed   By: Rudie Meyer M.D.   On: 07/12/2018 14:17   Dg Hip Unilat W Or Wo Pelvis 2-3 Views Right  Result Date: 07/12/2018 CLINICAL DATA:  Larey Seat earlier today.  Right hip pain. EXAM: DG HIP (WITH OR WITHOUT PELVIS) 2-3V RIGHT COMPARISON:  None. FINDINGS: There is a fracture dislocation involving the right hip. The hip is dislocated superiorly and posteriorly fracture fragments are noted above and below the acetabulum. CT may be helpful for further evaluation once the hip is relocated.  The left hip is intact. The pubic symphysis and SI joints are intact. No pelvic fractures. IMPRESSION: Fracture dislocation of the right hip. Electronically Signed   By: Rudie Meyer M.D.   On: 07/12/2018 14:04   Dg Femur Min 2 Views Right  Result Date: 07/12/2018 CLINICAL DATA:  Larey Seat from roof.  Right hip pain. EXAM: RIGHT FEMUR 2 VIEWS COMPARISON:  None. FINDINGS: Fracture dislocation of the right hip is noted. The right femur is intact. No femoral shaft fracture. The knee joint is maintained. IMPRESSION: Fracture dislocation of the right hip. No femoral shaft fracture. Electronically Signed   By: Rudie Meyer M.D.   On: 07/12/2018 14:04    Pending Labs Unresulted Labs (From admission, onward)    Start     Ordered   07/19/18 0500  Creatinine, serum  (enoxaparin (LOVENOX)    CrCl >/= 30 ml/min)  Weekly,   R    Comments:  while on enoxaparin therapy    07/12/18 1510   07/12/18 1510  Comprehensive metabolic panel  Once,   R     17/49/44 1510   07/12/18 1509  HIV antibody (Routine Testing)  Once,   R     07/12/18 1510          Vitals/Pain Today's Vitals   07/12/18 1315 07/12/18 1330 07/12/18 1400 07/12/18 1500  BP: (!) 159/116 (!) 177/103 (!) 180/108 (!) 163/110  Pulse: 87 81 78 78  Resp:      Temp:      TempSrc:      SpO2: 96% 98% 93% 97%    Isolation Precautions No active isolations  Medications Medications  HYDROmorphone (DILAUDID) injection 1 mg (has no administration in time range)  acetaminophen (TYLENOL) tablet 650 mg (has no administration in time range)    Or  acetaminophen (TYLENOL) suppository 650 mg (has no administration in time range)  enoxaparin (LOVENOX) injection 40 mg (has no administration in time range)  oxyCODONE (Oxy IR/ROXICODONE) immediate release tablet 5-10 mg (has no administration in time range)  methocarbamol (ROBAXIN) tablet 500 mg (has no administration in time range)    Or  methocarbamol (ROBAXIN) 500 mg in dextrose 5 % 50 mL IVPB (has no  administration in time range)  diphenhydrAMINE (BENADRYL) 12.5 MG/5ML elixir 12.5-25 mg (has no administration in time range)  docusate sodium (COLACE) capsule 100 mg (has no administration in time  range)  ondansetron (ZOFRAN) tablet 4 mg (has no administration in time range)    Or  ondansetron (ZOFRAN) injection 4 mg (has no administration in time range)  sodium chloride 0.9 % bolus 500 mL (500 mLs Intravenous New Bag/Given 07/12/18 1501)  ondansetron (ZOFRAN) injection 4 mg (4 mg Intravenous Given 07/12/18 1500)  HYDROmorphone (DILAUDID) injection 1 mg (1 mg Intramuscular Given 07/12/18 1315)    Mobility walks Moderate fall risk   Focused Assessments Cardiac Assessment Handoff:    Lab Results  Component Value Date   TROPONINI <0.30 09/18/2012   No results found for: DDIMER Does the Patient currently have chest pain? No     R Recommendations: See Admitting Provider Note  Report given to:   Additional Notes:   Patient has a right hip fracture dislocation.  Appears to be a femoral head.  Was informed pt may need urgent closed reduction of this. Given his size and nature of the injury recommend doing this in the operative suite with paralysis and sedation per MD

## 2018-07-12 NOTE — ED Notes (Signed)
Unable to gain IV access at this time, verbal order given by Dr. Adriana Simas for Dilaudid 1mg   Intramuscular.

## 2018-07-12 NOTE — Op Note (Signed)
  Joseph Shannon male 38 y.o. 07/12/2018  PreOperative Diagnosis: Right femoral head fracture dislocation Right posterior wall acetabulum fracture  PostOperative Diagnosis: Same  PROCEDURE: Closed reduction of right femoral head fracture dislocation under anesthesia with manipulation Closed treatment of right posterior wall acetabulum fracture Placement of distal femoral traction pin   SURGEON: Dub Mikes, MD  ASSISTANT: None  ANESTHESIA: General  FINDINGS: See preoperative diagnosis  IMPLANTS: None  INDICATIONS:37 y.o. male fell from a roof while cleaning the gutters today.  He landed on his right leg.  He had immediate pain and swelling in his right hip.  He is brought to the emergency department diagnosed with the above diagnosis.  Orthopedics was consulted.  Given the fracture dislocation of his right hip he was indicated for urgent/emergent closed reduction under anesthesia.  He understood the risk, benefits of this procedure and also understood he would also need definitive surgical intervention after the fracture was better identified with CT scan.  He opted to proceed with closed reduction under anesthesia.  After weighing these risks he opted proceed.  We also discussed the placement of a distal femoral traction pin given the likely instability of his hip fracture.  He opted to proceed.  PROCEDURE: Patient was identified in the preoperative holding area.  The right hip was marked myself.  Consent was himself and the patient.  Is taken the operative suite and general anesthesia was induced without difficulty.  A surgical timeout was performed.  A flat plate for x-ray was placed under the right hip.  Then using manual traction by flexing at the hip and the knee and using direct traction with countertraction the hip was reduced without difficulty.  Then the hip reduction was confirmed on flat plate fluoroscopy.  Once reduction was confirmed a distal femoral traction pin  was placed.  The skin was sterilized with ChloraPrep medially and laterally just to the distal femur region.  Then a 2.8 mm traction pin was placed across the distal femur without difficulty.  Then the traction bow was placed and he was placed in 10 pounds of traction.  Patient tolerated procedure well.  He was awakened from anesthesia and taken to recovery in stable condition.  We will order a CT scan post reduction to evaluate the nature of the fracture to aid with surgical planning purposes.  POST OPERATIVE INSTRUCTIONS: Patient will remain on bedrest He will remain with traction.  CT scan will be ordered to evaluate the fracture We will consult hospitalist team for aiding in medical management. Will be placed on Lovenox for DVT prophylaxis.  TOURNIQUET TIME: None  BLOOD LOSS:  N/A         DRAINS: none         SPECIMEN: none       COMPLICATIONS:  * No complications entered in OR log *         Disposition: PACU - hemodynamically stable.         Condition: stable

## 2018-07-12 NOTE — ED Notes (Signed)
Patient transported to X-ray 

## 2018-07-12 NOTE — Anesthesia Preprocedure Evaluation (Signed)
Anesthesia Evaluation  Patient identified by MRN, date of birth, ID band Patient awake    Reviewed: Allergy & Precautions, NPO status , Patient's Chart, lab work & pertinent test results, reviewed documented beta blocker date and time   Airway Mallampati: III  TM Distance: >3 FB Neck ROM: Full    Dental  (+) Dental Advisory Given   Pulmonary asthma , sleep apnea , former smoker,    breath sounds clear to auscultation       Cardiovascular hypertension, Pt. on medications and Pt. on home beta blockers +CHF   Rhythm:Regular Rate:Normal     Neuro/Psych TIA   GI/Hepatic negative GI ROS, Neg liver ROS,   Endo/Other  diabetes, Type 2, Oral Hypoglycemic Agents  Renal/GU negative Renal ROS     Musculoskeletal   Abdominal   Peds  Hematology negative hematology ROS (+)   Anesthesia Other Findings   Reproductive/Obstetrics                             Lab Results  Component Value Date   WBC 10.0 07/12/2018   HGB 14.1 07/12/2018   HCT 42.9 07/12/2018   MCV 95.5 07/12/2018   PLT 211 07/12/2018   Lab Results  Component Value Date   CREATININE 0.97 07/12/2018   BUN 12 07/12/2018   NA 136 07/12/2018   K 3.8 07/12/2018   CL 105 07/12/2018   CO2 24 07/12/2018    Anesthesia Physical Anesthesia Plan  ASA: II  Anesthesia Plan: General   Post-op Pain Management:    Induction: Intravenous  PONV Risk Score and Plan: 2 and Dexamethasone, Ondansetron and Treatment may vary due to age or medical condition  Airway Management Planned: Oral ETT  Additional Equipment:   Intra-op Plan:   Post-operative Plan: Extubation in OR  Informed Consent: I have reviewed the patients History and Physical, chart, labs and discussed the procedure including the risks, benefits and alternatives for the proposed anesthesia with the patient or authorized representative who has indicated his/her understanding and  acceptance.     Dental advisory given  Plan Discussed with: CRNA  Anesthesia Plan Comments:         Anesthesia Quick Evaluation

## 2018-07-12 NOTE — H&P (Signed)
Joseph Shannon is an 38 y.o. male.   Chief Complaint: Right femoral head fracture dislocation HPI: Patient is a 38 year old male who was on the roof cleaning gutters today and fell.  He landed on his right leg.  He had immediate pain in his right hip.  He is brought to the emergency department diagnosed with a right proximal femur fracture dislocation.  Orthopedics was consulted.  On evaluation patient complains of pain in the right hip.  This the only location of his pain.  He also has a little abrasion on his forehead but this is not super painful.  At baseline patient has a history of diabetes but does not know his A1c.  Also has a history of congestive heart failure but says he recently saw his cardiologist and was told he was doing well with that respect.  Does not know recent ejection fraction.  He denies any numbness or tingling in the right lower extremity.  Denies any other joint or extremity pain.  Past Medical History:  Diagnosis Date  . Asthma   . Chronic combined systolic and diastolic CHF (congestive heart failure) (HCC)   . Diabetes mellitus without complication (HCC)   . Hypertension   . NICM (nonischemic cardiomyopathy) (HCC)   . Normal coronary arteries   . OSA (obstructive sleep apnea)   . TIA (transient ischemic attack) 2014    Past Surgical History:  Procedure Laterality Date  . NO PAST SURGERIES      Family History  Problem Relation Age of Onset  . Hypertension Mother   . Heart disease Mother   . Hypertension Father   . Heart disease Father   . Heart disease Paternal Uncle   . Heart disease Maternal Grandmother    Social History:  reports that he quit smoking about 12 months ago. His smoking use included cigarettes. He smoked 0.25 packs per day. He has never used smokeless tobacco. He reports current alcohol use. He reports that he does not use drugs.  Allergies:  Allergies  Allergen Reactions  . Naproxen Sodium Nausea And Vomiting    (Not in a hospital  admission)   Results for orders placed or performed during the hospital encounter of 07/12/18 (from the past 48 hour(s))  CBC with Differential     Status: Abnormal   Collection Time: 07/12/18  2:16 PM  Result Value Ref Range   WBC 10.0 4.0 - 10.5 K/uL   RBC 4.49 4.22 - 5.81 MIL/uL   Hemoglobin 14.1 13.0 - 17.0 g/dL   HCT 94.5 85.9 - 29.2 %   MCV 95.5 80.0 - 100.0 fL   MCH 31.4 26.0 - 34.0 pg   MCHC 32.9 30.0 - 36.0 g/dL   RDW 44.6 28.6 - 38.1 %   Platelets 211 150 - 400 K/uL   nRBC 0.0 0.0 - 0.2 %   Neutrophils Relative % 79 %   Neutro Abs 7.9 (H) 1.7 - 7.7 K/uL   Lymphocytes Relative 12 %   Lymphs Abs 1.2 0.7 - 4.0 K/uL   Monocytes Relative 9 %   Monocytes Absolute 0.9 0.1 - 1.0 K/uL   Eosinophils Relative 0 %   Eosinophils Absolute 0.0 0.0 - 0.5 K/uL   Basophils Relative 0 %   Basophils Absolute 0.0 0.0 - 0.1 K/uL   Immature Granulocytes 0 %   Abs Immature Granulocytes 0.04 0.00 - 0.07 K/uL    Comment: Performed at [facility], [address] [place], [place] [address]  Basic metabolic  panel     Status: Abnormal   Collection Time: 07/12/18  2:16 PM  Result Value Ref Range   Sodium 134 (L) 135 - 145 mmol/L   Potassium 3.5 3.5 - 5.1 mmol/L   Chloride 103 98 - 111 mmol/L   CO2 23 22 - 32 mmol/L   Glucose, Bld 88 70 - 99 mg/dL   BUN 14 6 - 20 mg/dL   Creatinine, Ser 1.611.06 0.61 - 1.24 mg/dL   Calcium 8.6 (L) 8.9 - 10.3 mg/dL   GFR calc non Af Amer >60 >60 mL/min   GFR calc Af Amer >60 >60 mL/min   Anion gap 8 5 - 15    Comment: Performed at [facility], [address] [place], [place] [address]   Ct Head Wo Contrast  Result Date: 07/12/2018 CLINICAL DATA:  Larey SeatFell.  Hit head. EXAM: CT HEAD WITHOUT CONTRAST CT CERVICAL SPINE WITHOUT CONTRAST TECHNIQUE: Multidetector CT imaging of the head and cervical spine was performed following the standard protocol without intravenous contrast. Multiplanar CT image reconstructions of the cervical spine were also generated.  COMPARISON:  Head CT 01/22/2018 FINDINGS: CT HEAD FINDINGS Brain: The ventricles are normal in size and configuration. No extra-axial fluid collections are identified. The gray-white differentiation is maintained. No CT findings for acute hemispheric infarction or intracranial hemorrhage. No mass lesions. The brainstem and cerebellum are normal. Vascular: No hyperdense vessels or obvious aneurysm. Skull: No acute skull fracture.  No bone lesion. Sinuses/Orbits: The paranasal sinuses and mastoid air cells are clear. The globes are intact. Other: A frontal scalp hematoma is noted. No underlying skull fracture. CT CERVICAL SPINE FINDINGS Alignment: Normal. Skull base and vertebrae: No acute fracture. The facets are normally aligned. No facet or laminar fractures. Soft tissues and spinal canal: No prevertebral fluid or swelling. No visible canal hematoma. Disc levels: The spinal canal is generous. No spinal stenosis. No large disc protrusions or foraminal stenosis. Upper chest: Not covered. Other: No neck mass, adenopathy or hematoma. IMPRESSION: 1. No acute intracranial findings or skull fracture. 2. Normal alignment of the cervical vertebral bodies and no acute cervical spine fracture. 3. Frontal scalp hematoma but no underlying fracture or radiopaque foreign body. Electronically Signed   By: Rudie MeyerP.  Gallerani M.D.   On: 07/12/2018 14:17   Ct Cervical Spine Wo Contrast  Result Date: 07/12/2018 CLINICAL DATA:  Larey SeatFell.  Hit head. EXAM: CT HEAD WITHOUT CONTRAST CT CERVICAL SPINE WITHOUT CONTRAST TECHNIQUE: Multidetector CT imaging of the head and cervical spine was performed following the standard protocol without intravenous contrast. Multiplanar CT image reconstructions of the cervical spine were also generated. COMPARISON:  Head CT 01/22/2018 FINDINGS: CT HEAD FINDINGS Brain: The ventricles are normal in size and configuration. No extra-axial fluid collections are identified. The gray-white differentiation is  maintained. No CT findings for acute hemispheric infarction or intracranial hemorrhage. No mass lesions. The brainstem and cerebellum are normal. Vascular: No hyperdense vessels or obvious aneurysm. Skull: No acute skull fracture.  No bone lesion. Sinuses/Orbits: The paranasal sinuses and mastoid air cells are clear. The globes are intact. Other: A frontal scalp hematoma is noted. No underlying skull fracture. CT CERVICAL SPINE FINDINGS Alignment: Normal. Skull base and vertebrae: No acute fracture. The facets are normally aligned. No facet or laminar fractures. Soft tissues and spinal canal: No prevertebral fluid or swelling. No visible canal hematoma. Disc levels: The spinal canal is generous. No spinal stenosis. No large disc protrusions or foraminal stenosis. Upper chest: Not covered. Other:  No neck mass, adenopathy or hematoma. IMPRESSION: 1. No acute intracranial findings or skull fracture. 2. Normal alignment of the cervical vertebral bodies and no acute cervical spine fracture. 3. Frontal scalp hematoma but no underlying fracture or radiopaque foreign body. Electronically Signed   By: Rudie Meyer M.D.   On: 07/12/2018 14:17   Dg Hip Unilat W Or Wo Pelvis 2-3 Views Right  Result Date: 07/12/2018 CLINICAL DATA:  Larey Seat earlier today.  Right hip pain. EXAM: DG HIP (WITH OR WITHOUT PELVIS) 2-3V RIGHT COMPARISON:  None. FINDINGS: There is a fracture dislocation involving the right hip. The hip is dislocated superiorly and posteriorly fracture fragments are noted above and below the acetabulum. CT may be helpful for further evaluation once the hip is relocated. The left hip is intact. The pubic symphysis and SI joints are intact. No pelvic fractures. IMPRESSION: Fracture dislocation of the right hip. Electronically Signed   By: Rudie Meyer M.D.   On: 07/12/2018 14:04   Dg Femur Min 2 Views Right  Result Date: 07/12/2018 CLINICAL DATA:  Larey Seat from roof.  Right hip pain. EXAM: RIGHT FEMUR 2 VIEWS  COMPARISON:  None. FINDINGS: Fracture dislocation of the right hip is noted. The right femur is intact. No femoral shaft fracture. The knee joint is maintained. IMPRESSION: Fracture dislocation of the right hip. No femoral shaft fracture. Electronically Signed   By: Rudie Meyer M.D.   On: 07/12/2018 14:04    Review of Systems  Constitutional: Negative.   HENT: Negative.   Eyes: Negative.   Respiratory: Negative.   Cardiovascular: Negative.   Gastrointestinal: Negative.   Musculoskeletal:       Right hip pain  Skin: Negative.   Neurological: Negative.   Psychiatric/Behavioral: Negative.     Blood pressure (!) 180/108, pulse 78, temperature 99.2 F (37.3 C), temperature source Oral, resp. rate 16, SpO2 93 %. Physical Exam  Constitutional: He appears well-developed.  HENT:  Head: Normocephalic.  Eyes: Conjunctivae are normal.  Neck: Neck supple.  Cardiovascular: Normal rate.  HTN  Respiratory: Effort normal.  GI: Soft.  Musculoskeletal:     Comments: Right lower extremity in a shortened position.  Is externally rotated.  He has tenderness palpation about the right hip region.  No tenderness distally about the thigh, knee, leg or ankle or foot.  He is able to actively dorsiflex plantarflex the foot.  Did not assess for knee motion given his injury.  No pain to palpation about the left lower extremity or bilateral upper extremities.  No crepitance with range of motion.  No evidence of injury of bilateral upper extremities or left lower extremity.  Palpable radial pulses bilaterally.  Difficult to palpate pedal pulses given swelling bilaterally.  Feet are warm and well-perfused.  Patient endorses intact sensation to light touch in bilateral hands as well as bilateral feet.     Assessment/Plan Patient has a right hip fracture dislocation.  Appears to be a femoral head with possible femoral neck component.  He will need urgent closed reduction of this.  Given his size and nature of the  injury recommend doing this in the operative suite with paralysis and sedation.  This will assure ease of reduction and lower the risk for displacing her femoral neck fracture if it is present.  After close reduction will likely place him in skeletal traction but will assess stability at the time of reduction.  He will need a CT scan post reduction to evaluate the nature of his fracture.  We discussed the need for possible open treatment versus total hip arthroplasty based on the injury.  We will know more once the CT scan is completed.  For now he will remain n.p.o.  Will admit him to the hospital for pain control and monitoring.  Terance Hart, MD 07/12/2018, 3:02 PM

## 2018-07-12 NOTE — ED Notes (Signed)
This RN spoke with Dr. Adriana Simas, MD. Per physician pt does not need to be a leveled trauma at this time.

## 2018-07-12 NOTE — ED Notes (Signed)
Patient transported to CT 

## 2018-07-13 ENCOUNTER — Inpatient Hospital Stay (HOSPITAL_COMMUNITY): Payer: BLUE CROSS/BLUE SHIELD

## 2018-07-13 LAB — GLUCOSE, CAPILLARY
Glucose-Capillary: 75 mg/dL (ref 70–99)
Glucose-Capillary: 92 mg/dL (ref 70–99)
Glucose-Capillary: 172 mg/dL — ABNORMAL HIGH (ref 70–99)
Glucose-Capillary: 161 mg/dL — ABNORMAL HIGH (ref 70–99)
Glucose-Capillary: 147 mg/dL — ABNORMAL HIGH (ref 70–99)

## 2018-07-13 LAB — HIV ANTIBODY (ROUTINE TESTING W REFLEX): HIV Screen 4th Generation wRfx: NONREACTIVE

## 2018-07-13 MED ORDER — MUPIROCIN CALCIUM 2 % EX CREA
TOPICAL_CREAM | Freq: Three times a day (TID) | CUTANEOUS | Status: DC
  Administered 2018-07-13: 1 via TOPICAL
  Administered 2018-07-14 – 2018-07-17 (×10): via TOPICAL
  Filled 2018-07-13: qty 15

## 2018-07-13 NOTE — Consult Note (Signed)
WOC Nurse wound consult note Reason for Consult:abrasion to forehead sustained during fall. Patient is concerned about scar and requests topical care. Patient's mother at bedside. Wound type:trauma Pressure Injury POA:NA Measurement:6cm x 0.2cm x 0.1cm Wound bed: pink, with scant exudate (serous), dried exudate (scabbing) noted at periphery Drainage (amount, consistency, odor) As described above Periwound:intact, dry Dressing procedure/placement/frequency: I have provided topical care orders for TID (three times daily) mupirocin cream. We will cleanse with NS [prior to application and not apply a dressing due to wound location. Patient is satisfied with POC.  WOC nursing team will not follow, but will remain available to this patient, the nursing and medical teams.  Please re-consult if needed. Thanks, Ladona Mow, MSN, RN, GNP, [place]  Pager# [phone]

## 2018-07-13 NOTE — Plan of Care (Signed)
Problem: Self-Concept: Goal: Ability to maintain and perform role responsibilities to the fullest extent possible will improve Outcome: Progressing   Problem: Pain Management: Goal: Pain level will decrease Outcome: Progressing   

## 2018-07-13 NOTE — Progress Notes (Signed)
[facility] PROGRESS NOTE  Joseph Shannon NHA:[id number] DOB: Sep 11, 1980 DOA: 07/12/2018 PCP: Eather Colas, FNP   Narrative: 38 year old African-American male BMI 28 Resistant hypertension renal ultrasound performed Chronic systolic heart failure with chronic nonischemic cardiomyopathy History of TIA 2013 Diabetes with diabetic neuropathy Hyperlipidemia CKD stage II-III ?  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 Asthma   Patient was apparently cleaning gutters in the second story building fell off a ladder immediately had pain Pain was 8 on 10   A & Plan Complex hip fracture Currently in traction-pain mod controlled on IV meds Needs further input from Ortho trauma--defer to them further management decisions Nonischemic cardiomyopathy Cont Bystolic 20 qd, aldactone 50 daily--seems euvolemic, lasix 40 bid on hold at this time, holding lisinopril, holding ASA Resistant hypertension Cont amlodipine 5, imdur 120, hydalazine 100 bid Well controlled so far Prior TIA 2000 13/14  Not on asa 81 Diabetes with polyneuropathy Holding metformin at this time-cont gabapentin for neuropathy SS! Coverage a this time-sugars 87--120 on SSI Pineal gland cystic structure OP characterization BMI 28  OP management Asthma          DVT loveneox  Code Status: full  Communication: d/w mother  Disposition Plan: inpatient pending multiple ortho procedures anticipated    Vivi Piccirilli, MD  [facility] [employer] app OR [email or web address] 7PM-7AM contact night coverage as above 07/13/2018, 8:28 AM  LOS: 1 day   Consultants:  ortho  Procedures:  n  Antimicrobials:  n  Interval history/Subjective: Awake alert in NAD Pain manageable No fever   Objective:  Vitals:  Vitals:   07/12/18 2336 07/13/18 0357  BP: 130/81 120/78  Pulse: 81 82  Resp:  18  Temp: 98.5 F (36.9 C) 99.3 F (37.4 C)  SpO2: 97% 95%    Exam:   eomi ncat no distress cta b no adde dsound s1  s 2no m abd soft Traction on RLE Able to wiggle toes   I have personally reviewed the following:  DATA   Labs:  LFTs and renal panel normal  Hemoglobin stable at 14.1 no white count  Imaging studies:  CT of right hip 3/15 IMPRESSION: 1. Interval reduction of the right hip dislocation. 2. Complex oblique coursing head split type fracture of the femoral head with numerous small bone fragments. 3. Avulsion type fracture involving the roof of the acetabulum. 4. Small fracture fragments along the posterior wall of the acetabulum without obvious donor site. Possible small avulsion fracture from the acetabulum or fragments from the humeral head. 5. Suspect right sacral fracture. Recommend dedicated full bony pelvic CT scan.    Scheduled Meds: . amLODipine  5 mg Oral Daily  . atorvastatin  40 mg Oral Daily  . budesonide  0.25 mg Nebulization BID  . docusate sodium  100 mg Oral BID  . enoxaparin (LOVENOX) injection  40 mg Subcutaneous Q24H  . fentaNYL      . gabapentin  300 mg Oral BID  . hydrALAZINE  100 mg Oral BID  . insulin aspart  0-5 Units Subcutaneous QHS  . insulin aspart  0-9 Units Subcutaneous TID WC  . isosorbide mononitrate  120 mg Oral Daily  . methocarbamol  500 mg Oral BID  . multivitamin with minerals  1 tablet Oral Daily  . nebivolol  20 mg Oral Daily  . nicotine  21 mg Transdermal Daily  . omega-3 acid ethyl esters  1 g Oral Daily  . pantoprazole  40 mg Oral Daily  . spironolactone  50 mg Oral Daily   Continuous Infusions: . methocarbamol (ROBAXIN) IV      Principal Problem:   Closed right hip fracture (HCC) Active Problems:   Hypertension   DOE (dyspnea on exertion)   DM (diabetes mellitus) (HCC)   Acute on chronic systolic heart failure (HCC)   Hypertensive heart disease   Tobacco use   Hyperlipidemia   Hip fracture (HCC)   LOS: 1 day

## 2018-07-13 NOTE — Progress Notes (Signed)
Reviewed case with Dr. Susa Simmonds. 38 yo with P[place] IV right hip fracture dislocation. Recommend proceeding with ORIF of posterior wall +/- ORIF of femoral head. Will make NPO past midnight and post for tomorrow. Formal consult to follow in AM.  Roby Lofts, MD [facility] [phone] (phone)

## 2018-07-13 NOTE — Progress Notes (Signed)
Subjective: 1 Day Post-Op Procedure(s) (LRB): CLOSED REDUCTION HIP (Right) INSERTION OF TRACTION PIN (Right) Patient reports pain is controlled.  He is tolerating the traction well.  He had a CT scan last night.  No complaints today.  Denies any shortness of breath.  Objective: Vital signs in last 24 hours: Temp:  [98 F (36.7 C)-99.3 F (37.4 C)] 99.3 F (37.4 C) (03/15 0357) Pulse Rate:  [68-88] 82 (03/15 0357) Resp:  [13-18] 18 (03/15 0357) BP: (120-180)/(78-116) 120/78 (03/15 0357) SpO2:  [87 %-100 %] 95 % (03/15 0357)  Intake/Output from previous day: 03/14 0701 - 03/15 0700 In: 1000 [I.V.:500; IV Piggyback:500] Out: 0  Intake/Output this shift: Total I/O In: 500 [I.V.:500] Out: 0   Recent Labs    07/12/18 1416  HGB 14.1   Recent Labs    07/12/18 1416  WBC 10.0  RBC 4.49  HCT 42.9  PLT 211   Recent Labs    07/12/18 1416 07/12/18 1644  NA 134* 136  K 3.5 3.8  CL 103 105  CO2 23 24  BUN 14 12  CREATININE 1.06 0.97  GLUCOSE 88 89  CALCIUM 8.6* 8.5*   No results for input(s): LABPT, INR in the last 72 hours.  Patient is awake and alert and in no acute distress. Right lower extremity and distal femoral traction.  Pin sites appear clean.  Patient endorses sensation of the thigh, leg and foot.  He is able to dorsiflex and plantarflex the ankle.  Extremity is warm and well-perfused.   Assessment/Plan: 1 Day Post-Op Procedure(s) (LRB): CLOSED REDUCTION HIP (Right) INSERTION OF TRACTION PIN (Right)  Patient underwent closed reduction of his right femoral head and acetabulum fracture dislocation last night.  He is currently in femoral traction.  CT scan shows a femoral head fracture and acetabulum fracture.  The hip is well reduced.  We will discuss his case with the orthopedic traumatologist given the complex nature of his injury.  He will likely need surgical intervention by them.  For now he will remain on bedrest and and femoral traction.  We will  continue Lovenox for DVT prophylaxis No need for antibiotics currently Regular diet Appreciate hospitalist assistance with medical comorbidities.      Terance Hart 07/13/2018, 6:51 AM

## 2018-07-13 NOTE — Anesthesia Preprocedure Evaluation (Addendum)
Anesthesia Evaluation  Patient identified by MRN, date of birth, ID band Patient awake    Reviewed: Allergy & Precautions, NPO status , Patient's Chart, lab work & pertinent test results, reviewed documented beta blocker date and time   History of Anesthesia Complications Negative for: history of anesthetic complications  Airway Mallampati: II  TM Distance: >3 FB Neck ROM: Full    Dental no notable dental hx. (+) Dental Advisory Given   Pulmonary asthma , sleep apnea , former smoker,    Pulmonary exam normal        Cardiovascular hypertension, Pt. on medications and Pt. on home beta blockers +CHF  Normal cardiovascular exam     Neuro/Psych TIA   GI/Hepatic negative GI ROS, Neg liver ROS,   Endo/Other  diabetes, Type 2, Oral Hypoglycemic Agents  Renal/GU negative Renal ROS     Musculoskeletal   Abdominal   Peds  Hematology negative hematology ROS (+)   Anesthesia Other Findings   Reproductive/Obstetrics                            Lab Results  Component Value Date   WBC 10.0 07/12/2018   HGB 14.1 07/12/2018   HCT 42.9 07/12/2018   MCV 95.5 07/12/2018   PLT 211 07/12/2018   Lab Results  Component Value Date   CREATININE 0.97 07/12/2018   BUN 12 07/12/2018   NA 136 07/12/2018   K 3.8 07/12/2018   CL 105 07/12/2018   CO2 24 07/12/2018    Anesthesia Physical  Anesthesia Plan  ASA: III  Anesthesia Plan: General   Post-op Pain Management:    Induction: Intravenous  PONV Risk Score and Plan: 2 and Dexamethasone, Ondansetron and Treatment may vary due to age or medical condition  Airway Management Planned: Oral ETT  Additional Equipment:   Intra-op Plan:   Post-operative Plan: Extubation in OR  Informed Consent: I have reviewed the patients History and Physical, chart, labs and discussed the procedure including the risks, benefits and alternatives for the proposed  anesthesia with the patient or authorized representative who has indicated his/her understanding and acceptance.     Dental advisory given  Plan Discussed with: CRNA, Anesthesiologist and Surgeon  Anesthesia Plan Comments:        Anesthesia Quick Evaluation

## 2018-07-13 NOTE — Plan of Care (Signed)
  Problem: Education: Goal: Verbalization of understanding the information provided (i.e., activity precautions, restrictions, etc) will improve Outcome: Progressing   Problem: Pain Management: Goal: Pain level will decrease Outcome: Progressing   

## 2018-07-14 ENCOUNTER — Encounter (HOSPITAL_COMMUNITY): Payer: Self-pay | Admitting: Orthopaedic Surgery

## 2018-07-14 ENCOUNTER — Encounter (HOSPITAL_COMMUNITY)
Admission: EM | Disposition: A | Discharge status: Home / Self Care | Payer: Self-pay | Source: Home / Self Care | Attending: Student

## 2018-07-14 ENCOUNTER — Inpatient Hospital Stay (HOSPITAL_COMMUNITY): Payer: BLUE CROSS/BLUE SHIELD

## 2018-07-14 ENCOUNTER — Inpatient Hospital Stay (HOSPITAL_COMMUNITY): Payer: BLUE CROSS/BLUE SHIELD | Admitting: Anesthesiology

## 2018-07-14 DIAGNOSIS — S73004A Unspecified dislocation of right hip, initial encounter: Secondary | ICD-10-CM

## 2018-07-14 DIAGNOSIS — S72059A Unspecified fracture of head of unspecified femur, initial encounter for closed fracture: Secondary | ICD-10-CM

## 2018-07-14 DIAGNOSIS — S32421A Displaced fracture of posterior wall of right acetabulum, initial encounter for closed fracture: Secondary | ICD-10-CM

## 2018-07-14 HISTORY — PX: OPEN REDUCTION INTERNAL FIXATION ACETABULUM POSTERIOR LATERAL: SHX6834

## 2018-07-14 LAB — CBC WITH DIFFERENTIAL/PLATELET
Abs Immature Granulocytes: 0.03 10*3/uL (ref 0.00–0.07)
BASOS ABS: 0 10*3/uL (ref 0.0–0.1)
Basophils Relative: 1 %
Eosinophils Absolute: 0.1 10*3/uL (ref 0.0–0.5)
Eosinophils Relative: 2 %
HCT: 41 % (ref 39.0–52.0)
Hemoglobin: 13.4 g/dL (ref 13.0–17.0)
Immature Granulocytes: 0 %
LYMPHS PCT: 20 %
Lymphs Abs: 1.5 10*3/uL (ref 0.7–4.0)
MCH: 32.1 pg (ref 26.0–34.0)
MCHC: 32.7 g/dL (ref 30.0–36.0)
MCV: 98.1 fL (ref 80.0–100.0)
Monocytes Absolute: 1 10*3/uL (ref 0.1–1.0)
Monocytes Relative: 12 %
NEUTROS ABS: 5.1 10*3/uL (ref 1.7–7.7)
Neutrophils Relative %: 65 %
PLATELETS: 190 10*3/uL (ref 150–400)
RBC: 4.18 MIL/uL — ABNORMAL LOW (ref 4.22–5.81)
RDW: 12 % (ref 11.5–15.5)
WBC: 7.8 10*3/uL (ref 4.0–10.5)
nRBC: 0 % (ref 0.0–0.2)

## 2018-07-14 LAB — GLUCOSE, CAPILLARY
GLUCOSE-CAPILLARY: 121 mg/dL — AB (ref 70–99)
GLUCOSE-CAPILLARY: 242 mg/dL — AB (ref 70–99)
Glucose-Capillary: 106 mg/dL — ABNORMAL HIGH (ref 70–99)
Glucose-Capillary: 100 mg/dL — ABNORMAL HIGH (ref 70–99)
Glucose-Capillary: 135 mg/dL — ABNORMAL HIGH (ref 70–99)

## 2018-07-14 LAB — SURGICAL PCR SCREEN
MRSA, PCR: NEGATIVE
Staphylococcus aureus: NEGATIVE

## 2018-07-14 LAB — RENAL FUNCTION PANEL
Albumin: 3.1 g/dL — ABNORMAL LOW (ref 3.5–5.0)
Anion gap: 7 (ref 5–15)
BUN: 12 mg/dL (ref 6–20)
CO2: 25 mmol/L (ref 22–32)
Calcium: 8.2 mg/dL — ABNORMAL LOW (ref 8.9–10.3)
Chloride: 105 mmol/L (ref 98–111)
Creatinine, Ser: 0.96 mg/dL (ref 0.61–1.24)
Glucose, Bld: 108 mg/dL — ABNORMAL HIGH (ref 70–99)
Phosphorus: 3 mg/dL (ref 2.5–4.6)
Potassium: 3.7 mmol/L (ref 3.5–5.1)
Sodium: 137 mmol/L (ref 135–145)

## 2018-07-14 LAB — HEMOGLOBIN A1C
HEMOGLOBIN A1C: 5.3 % (ref 4.8–5.6)
Mean Plasma Glucose: 105.41 mg/dL

## 2018-07-14 SURGERY — OPEN REDUCTION INTERNAL FIXATION ACETABULUM POSTERIOR LATERAL
Anesthesia: General | Site: Hip | Laterality: Right

## 2018-07-14 MED ORDER — FENTANYL CITRATE (PF) 100 MCG/2ML IJ SOLN
25.0000 ug | INTRAMUSCULAR | Status: DC | PRN
  Administered 2018-07-14: 50 ug via INTRAVENOUS
  Administered 2018-07-14: 25 ug via INTRAVENOUS

## 2018-07-14 MED ORDER — VANCOMYCIN HCL 1000 MG IV SOLR
INTRAVENOUS | Status: DC | PRN
  Administered 2018-07-14: 1000 mg via TOPICAL

## 2018-07-14 MED ORDER — MIDAZOLAM HCL 5 MG/5ML IJ SOLN
INTRAMUSCULAR | Status: DC | PRN
  Administered 2018-07-14: 2 mg via INTRAVENOUS

## 2018-07-14 MED ORDER — ROCURONIUM BROMIDE 50 MG/5ML IV SOSY
PREFILLED_SYRINGE | INTRAVENOUS | Status: AC
  Filled 2018-07-14: qty 10

## 2018-07-14 MED ORDER — LIDOCAINE 2% (20 MG/ML) 5 ML SYRINGE
INTRAMUSCULAR | Status: AC
  Filled 2018-07-14: qty 5

## 2018-07-14 MED ORDER — VANCOMYCIN HCL 1000 MG IV SOLR
INTRAVENOUS | Status: AC
  Filled 2018-07-14: qty 1000

## 2018-07-14 MED ORDER — EPHEDRINE SULFATE-NACL 50-0.9 MG/10ML-% IV SOSY
PREFILLED_SYRINGE | INTRAVENOUS | Status: DC | PRN
  Administered 2018-07-14: 10 mg via INTRAVENOUS

## 2018-07-14 MED ORDER — ROCURONIUM BROMIDE 100 MG/10ML IV SOLN
INTRAVENOUS | Status: DC | PRN
  Administered 2018-07-14: 100 mg via INTRAVENOUS
  Administered 2018-07-14: 20 mg via INTRAVENOUS
  Administered 2018-07-14 (×2): 10 mg via INTRAVENOUS

## 2018-07-14 MED ORDER — ONDANSETRON HCL 4 MG/2ML IJ SOLN
INTRAMUSCULAR | Status: DC | PRN
  Administered 2018-07-14: 4 mg via INTRAVENOUS

## 2018-07-14 MED ORDER — CEFAZOLIN SODIUM-DEXTROSE 2-4 GM/100ML-% IV SOLN
2.0000 g | Freq: Three times a day (TID) | INTRAVENOUS | Status: AC
  Administered 2018-07-14 – 2018-07-15 (×3): 2 g via INTRAVENOUS
  Filled 2018-07-14 (×3): qty 100

## 2018-07-14 MED ORDER — SCOPOLAMINE 1 MG/3DAYS TD PT72
1.0000 | MEDICATED_PATCH | TRANSDERMAL | Status: DC
  Administered 2018-07-14: 1.5 mg via TRANSDERMAL
  Filled 2018-07-14: qty 1

## 2018-07-14 MED ORDER — TOBRAMYCIN SULFATE 1.2 G IJ SOLR
INTRAMUSCULAR | Status: AC
  Filled 2018-07-14: qty 1.2

## 2018-07-14 MED ORDER — SODIUM CHLORIDE 0.9 % IV SOLN
INTRAVENOUS | Status: DC | PRN
  Administered 2018-07-14: 80 ug/min via INTRAVENOUS

## 2018-07-14 MED ORDER — ENOXAPARIN SODIUM 40 MG/0.4ML ~~LOC~~ SOLN
40.0000 mg | SUBCUTANEOUS | Status: DC
  Administered 2018-07-15 – 2018-07-17 (×3): 40 mg via SUBCUTANEOUS
  Filled 2018-07-14 (×3): qty 0.4

## 2018-07-14 MED ORDER — SUGAMMADEX SODIUM 200 MG/2ML IV SOLN
INTRAVENOUS | Status: DC | PRN
  Administered 2018-07-14: 200 mg via INTRAVENOUS

## 2018-07-14 MED ORDER — ALBUMIN HUMAN 5 % IV SOLN
INTRAVENOUS | Status: DC | PRN
  Administered 2018-07-14 (×2): via INTRAVENOUS

## 2018-07-14 MED ORDER — DEXAMETHASONE SODIUM PHOSPHATE 10 MG/ML IJ SOLN
INTRAMUSCULAR | Status: DC | PRN
  Administered 2018-07-14: 10 mg via INTRAVENOUS

## 2018-07-14 MED ORDER — ROCURONIUM BROMIDE 50 MG/5ML IV SOSY
PREFILLED_SYRINGE | INTRAVENOUS | Status: AC
  Filled 2018-07-14: qty 5

## 2018-07-14 MED ORDER — ACETAMINOPHEN 500 MG PO TABS
1000.0000 mg | ORAL_TABLET | Freq: Once | ORAL | Status: AC
  Administered 2018-07-14: 1000 mg via ORAL
  Filled 2018-07-14: qty 2

## 2018-07-14 MED ORDER — PHENYLEPHRINE 40 MCG/ML (10ML) SYRINGE FOR IV PUSH (FOR BLOOD PRESSURE SUPPORT)
PREFILLED_SYRINGE | INTRAVENOUS | Status: AC
  Filled 2018-07-14: qty 10

## 2018-07-14 MED ORDER — FENTANYL CITRATE (PF) 100 MCG/2ML IJ SOLN
INTRAMUSCULAR | Status: AC
  Filled 2018-07-14: qty 2

## 2018-07-14 MED ORDER — LACTATED RINGERS IV SOLN
INTRAVENOUS | Status: DC
  Administered 2018-07-14: 11:00:00 via INTRAVENOUS

## 2018-07-14 MED ORDER — ALBUMIN HUMAN 5 % IV SOLN: INTRAVENOUS | Status: DC | PRN

## 2018-07-14 MED ORDER — PHENYLEPHRINE 40 MCG/ML (10ML) SYRINGE FOR IV PUSH (FOR BLOOD PRESSURE SUPPORT)
PREFILLED_SYRINGE | INTRAVENOUS | Status: DC | PRN
  Administered 2018-07-14: 80 ug via INTRAVENOUS
  Administered 2018-07-14: 160 ug via INTRAVENOUS
  Administered 2018-07-14 (×2): 120 ug via INTRAVENOUS
  Administered 2018-07-14: 200 ug via INTRAVENOUS
  Administered 2018-07-14: 80 ug via INTRAVENOUS
  Administered 2018-07-14: 200 ug via INTRAVENOUS

## 2018-07-14 MED ORDER — LACTATED RINGERS IV SOLN
INTRAVENOUS | Status: DC | PRN
  Administered 2018-07-14: 13:00:00 via INTRAVENOUS

## 2018-07-14 MED ORDER — CEFAZOLIN SODIUM-DEXTROSE 2-3 GM-%(50ML) IV SOLR
INTRAVENOUS | Status: DC | PRN
  Administered 2018-07-14: 2 g via INTRAVENOUS

## 2018-07-14 MED ORDER — FENTANYL CITRATE (PF) 100 MCG/2ML IJ SOLN
INTRAMUSCULAR | Status: DC | PRN
  Administered 2018-07-14 (×4): 50 ug via INTRAVENOUS

## 2018-07-14 MED ORDER — CEFAZOLIN SODIUM 1 G IJ SOLR
INTRAMUSCULAR | Status: AC
  Filled 2018-07-14: qty 20

## 2018-07-14 MED ORDER — LIDOCAINE 2% (20 MG/ML) 5 ML SYRINGE
INTRAMUSCULAR | Status: DC | PRN
  Administered 2018-07-14: 100 mg via INTRAVENOUS

## 2018-07-14 MED ORDER — TOBRAMYCIN SULFATE 1.2 G IJ SOLR
INTRAMUSCULAR | Status: DC | PRN
  Administered 2018-07-14: 1.2 g

## 2018-07-14 MED ORDER — PROPOFOL 10 MG/ML IV BOLUS
INTRAVENOUS | Status: AC
  Filled 2018-07-14: qty 40

## 2018-07-14 MED ORDER — GABAPENTIN 300 MG PO CAPS
300.0000 mg | ORAL_CAPSULE | Freq: Three times a day (TID) | ORAL | Status: DC
  Administered 2018-07-14 – 2018-07-17 (×8): 300 mg via ORAL
  Filled 2018-07-14 (×8): qty 1

## 2018-07-14 MED ORDER — FENTANYL CITRATE (PF) 250 MCG/5ML IJ SOLN
INTRAMUSCULAR | Status: AC
  Filled 2018-07-14: qty 5

## 2018-07-14 MED ORDER — ACETAMINOPHEN 325 MG PO TABS
650.0000 mg | ORAL_TABLET | Freq: Four times a day (QID) | ORAL | Status: DC
  Administered 2018-07-14 – 2018-07-17 (×9): 650 mg via ORAL
  Filled 2018-07-14 (×9): qty 2

## 2018-07-14 MED ORDER — MIDAZOLAM HCL 2 MG/2ML IJ SOLN
INTRAMUSCULAR | Status: AC
  Filled 2018-07-14: qty 2

## 2018-07-14 MED ORDER — PHENYLEPHRINE 40 MCG/ML (10ML) SYRINGE FOR IV PUSH (FOR BLOOD PRESSURE SUPPORT)
PREFILLED_SYRINGE | INTRAVENOUS | Status: AC
  Filled 2018-07-14: qty 30

## 2018-07-14 MED ORDER — PROPOFOL 10 MG/ML IV BOLUS
INTRAVENOUS | Status: DC | PRN
  Administered 2018-07-14: 130 mg via INTRAVENOUS

## 2018-07-14 MED ORDER — EPHEDRINE 5 MG/ML INJ
INTRAVENOUS | Status: AC
  Filled 2018-07-14: qty 10

## 2018-07-14 MED ORDER — PROMETHAZINE HCL 25 MG/ML IJ SOLN: 6.2500 mg | INTRAMUSCULAR | Status: DC | PRN

## 2018-07-14 MED ORDER — 0.9 % SODIUM CHLORIDE (POUR BTL) OPTIME
TOPICAL | Status: DC | PRN
  Administered 2018-07-14: 1000 mL

## 2018-07-14 MED ORDER — OXYCODONE HCL 5 MG PO TABS
5.0000 mg | ORAL_TABLET | ORAL | Status: DC | PRN
  Administered 2018-07-14 – 2018-07-17 (×10): 10 mg via ORAL
  Filled 2018-07-14 (×10): qty 2

## 2018-07-14 SURGICAL SUPPLY — 61 items
BIT DRILL 2.5X300 (BIT) ×1 IMPLANT
BLADE CLIPPER SURG (BLADE) ×2 IMPLANT
CHLORAPREP W/TINT 26ML (MISCELLANEOUS) ×4 IMPLANT
COVER WAND RF STERILE (DRAPES) IMPLANT
DERMABOND ADVANCED (GAUZE/BANDAGES/DRESSINGS) ×2
DERMABOND ADVANCED .7 DNX12 (GAUZE/BANDAGES/DRESSINGS) ×2 IMPLANT
DRAIN CHANNEL 15F RND FF W/TCR (WOUND CARE) IMPLANT
DRAPE C-ARM 42X72 X-RAY (DRAPES) ×2 IMPLANT
DRAPE C-ARMOR (DRAPES) ×2 IMPLANT
DRAPE INCISE IOBAN 66X45 STRL (DRAPES) ×2 IMPLANT
DRAPE PROXIMA HALF (DRAPES) ×4 IMPLANT
DRAPE SURG 17X23 STRL (DRAPES) ×6 IMPLANT
DRAPE U-SHAPE 47X51 STRL (DRAPES) ×2 IMPLANT
DRAPE UNIVERSAL PACK (DRAPES) ×2 IMPLANT
DRILL BIT 2.5X300 (BIT) ×1
DRSG MEPILEX BORDER 4X12 (GAUZE/BANDAGES/DRESSINGS) ×2 IMPLANT
DRSG MEPILEX BORDER 4X4 (GAUZE/BANDAGES/DRESSINGS) IMPLANT
DRSG MEPILEX BORDER 4X8 (GAUZE/BANDAGES/DRESSINGS) IMPLANT
ELECT REM PT RETURN 9FT ADLT (ELECTROSURGICAL) ×2
ELECTRODE REM PT RTRN 9FT ADLT (ELECTROSURGICAL) ×1 IMPLANT
EVACUATOR SILICONE 100CC (DRAIN) IMPLANT
GLOVE BIO SURGEON STRL SZ 6.5 (GLOVE) ×6 IMPLANT
GLOVE BIO SURGEON STRL SZ7.5 (GLOVE) ×8 IMPLANT
GLOVE BIOGEL PI IND STRL 6.5 (GLOVE) ×1 IMPLANT
GLOVE BIOGEL PI IND STRL 7.5 (GLOVE) ×1 IMPLANT
GLOVE BIOGEL PI INDICATOR 6.5 (GLOVE) ×1
GLOVE BIOGEL PI INDICATOR 7.5 (GLOVE) ×1
GOWN STRL REUS W/ TWL LRG LVL3 (GOWN DISPOSABLE) ×2 IMPLANT
GOWN STRL REUS W/TWL LRG LVL3 (GOWN DISPOSABLE) ×2
KIT BASIN OR (CUSTOM PROCEDURE TRAY) ×2 IMPLANT
KIT TURNOVER KIT B (KITS) ×2 IMPLANT
MANIFOLD NEPTUNE II (INSTRUMENTS) ×2 IMPLANT
NS IRRIG 1000ML POUR BTL (IV SOLUTION) ×4 IMPLANT
PACK TOTAL JOINT (CUSTOM PROCEDURE TRAY) ×2 IMPLANT
PAD ARMBOARD 7.5X6 YLW CONV (MISCELLANEOUS) ×4 IMPLANT
PLATE SPRING 2H 3.5MM PELVIC (Plate) ×2 IMPLANT
PLATE SPRING 3.5MM 3H STRL (Plate) ×2 IMPLANT
SCREW CORTEX 3.5 22MM (Screw) ×1 IMPLANT
SCREW CORTEX 3.5 24MM (Screw) ×1 IMPLANT
SCREW CORTEX 3.5 28MM (Screw) ×2 IMPLANT
SCREW CORTEX 3.5 32MM (Screw) ×1 IMPLANT
SCREW LOCK CORT ST 3.5X22 (Screw) ×1 IMPLANT
SCREW LOCK CORT ST 3.5X24 (Screw) ×1 IMPLANT
SCREW LOCK CORT ST 3.5X28 (Screw) ×2 IMPLANT
SCREW LOCK CORT ST 3.5X32 (Screw) ×1 IMPLANT
SPONGE LAP 18X18 RF (DISPOSABLE) ×2 IMPLANT
STAPLER VISISTAT 35W (STAPLE) ×2 IMPLANT
SUCTION FRAZIER HANDLE 10FR (MISCELLANEOUS) ×1
SUCTION TUBE FRAZIER 10FR DISP (MISCELLANEOUS) ×1 IMPLANT
SUT MNCRL AB 3-0 PS2 18 (SUTURE) ×2 IMPLANT
SUT MON AB 2-0 CT1 36 (SUTURE) ×2 IMPLANT
SUT VIC AB 0 CT1 27 (SUTURE) ×3
SUT VIC AB 0 CT1 27XBRD ANBCTR (SUTURE) ×3 IMPLANT
SUT VIC AB 1 CT1 18XCR BRD 8 (SUTURE) ×1 IMPLANT
SUT VIC AB 1 CT1 8-18 (SUTURE) ×1
SUT VIC AB 2-0 CT1 27 (SUTURE) ×3
SUT VIC AB 2-0 CT1 TAPERPNT 27 (SUTURE) ×3 IMPLANT
TOWEL OR 17X24 6PK STRL BLUE (TOWEL DISPOSABLE) ×2 IMPLANT
TOWEL OR 17X26 10 PK STRL BLUE (TOWEL DISPOSABLE) ×4 IMPLANT
TRAY FOLEY MTR SLVR 16FR STAT (SET/KITS/TRAYS/PACK) IMPLANT
WATER STERILE IRR 1000ML POUR (IV SOLUTION) ×2 IMPLANT

## 2018-07-14 NOTE — Consult Note (Signed)
Orthopaedic Trauma Service (OTS) Consult   Patient ID: Joseph Shannon MRN: [id number] DOB/AGE: [birth year] 38 y.o.  Reason for Consult: Right femoral head fracture dislocation,  Right posterior wall acetabulum fracture Referring Physician: Dr. Susa Simmonds  HPI: Joseph Shannon is an 38 y.o. male being seen in consultation of Dr. Susa Simmonds for right hip fracture/dislocation. Patient fell form a ladder while cleaning gutters on 07/12/18. He landed on his right side and had immediate pain and inability to wear weight on the right lower extremity. He was seen in [facility] ED where imaging revealed a right hip fracture/dilocation with an associated right posterior wall acetabulum fracture. Orthopaedics was consulted and patient was taken to the operating room for closed reduction of the right hip and placement of distal femur traction pin. Due to the complex nature of the patient's injury, Dr. Susa Simmonds felt this would best be managed by an orthopaedic traumatologist. Patient has been on bedrest since time of initial surgery. He has been on Lovenox for DVT prophylaxis. He denies any numbness or tingling in the right lower extremity.  Denies any other joint or extremity pain.  At baseline patient has a history of diabetes but does not know his A1c.  Also has a history of congestive heart failure. Says he recently saw his cardiologist and was told he was doing well. Does not know recent ejection fraction.     Past Medical History:  Diagnosis Date  . Asthma   . Chronic combined systolic and diastolic CHF (congestive heart failure) (HCC)   . Diabetes mellitus without complication (HCC)   . Hypertension   . NICM (nonischemic cardiomyopathy) (HCC)   . Normal coronary arteries   . OSA (obstructive sleep apnea)   . TIA (transient ischemic attack) 2014    Past Surgical History:  Procedure Laterality Date  . NO PAST SURGERIES      Family History  Problem Relation Age of Onset  . Hypertension Mother   . Heart  disease Mother   . Hypertension Father   . Heart disease Father   . Heart disease Paternal Uncle   . Heart disease Maternal Grandmother     Social History:  reports that he quit smoking about 12 months ago. His smoking use included cigarettes. He smoked 0.25 packs per day. He has never used smokeless tobacco. He reports current alcohol use. He reports that he does not use drugs.  Allergies:  Allergies  Allergen Reactions  . Naproxen Sodium Nausea And Vomiting    Medications: I have reviewed the patient's current medications.  ROS: Constitutional: No fever or chills Vision: No changes in vision ENT: No difficulty swallowing CV: No chest pain Pulm: No SOB or wheezing GI: No nausea or vomiting GU: No urgency or inability to hold urine Skin: No poor wound healing Neurologic: No numbness or tingling Psychiatric: No depression or anxiety Heme: No bruising Allergic: No reaction to medications or food   Exam: Blood pressure 126/84, pulse 76, temperature 98.8 F (37.1 C), temperature source Oral, resp. rate 15, SpO2 100 %. General: Laying in bed, NAD Orientation: Alert and oriented Mood and Affect: Mood and affect appropriate Gait: Not assessed, patient in traction Coordination and balance: Within normal limits  Cardiac: Heart regular rate and rhythm  Respiratory: CTA anterior lung fields bilaterally  Right lower extremity: Distal femur traction pin in place. Minimal swelling of the extremity noted. Tenderness to palpation of hip Minimal tenderness to palpation of the thigh, knee, lower leg, ankle, or foot. Full ankle ROM. Able  to wiggle toes. Sensation intact to leg. Neurovascularly intact  Left lower extremity: Skin without lesions. No tenderness to palpation. Full painless ROM, full strength in each muscle group without evidence of instability. Sensory and motor function grossly intact. Neurovascularly intact.  Right Upper extremity: Skin without lesions. No tenderness to  palpation. Full painless ROM, full strength in each muscle group without evidence of instability. Sensory and motor function grossly intact. Neurovascularly intact.  Left Upper extremity: Skin without lesions. No tenderness to palpation. Full painless ROM, full strength in each muscle group without evidence of instability. Sensory and motor function grossly intact. Neurovascularly intact.   Medical Decision Making: Imaging: AP of the right hip shows previous right hip fracture/dislocation is reduced with the exception of superiorly displaced fragments laterally. CT scan of right hip reveals an oblique split type fracture through femoral head with avulsion fracture through roof of acetabulum. Small fracture fragments along the posterior wall of the acetabulum also noted.  Labs:  Results for orders placed or performed during the hospital encounter of 07/12/18 (from the past 24 hour(s))  Glucose, capillary     Status: None   Collection Time: 07/13/18  8:53 AM  Result Value Ref Range   Glucose-Capillary 92 70 - 99 mg/dL  Glucose, capillary     Status: Abnormal   Collection Time: 07/13/18 11:54 AM  Result Value Ref Range   Glucose-Capillary 172 (H) 70 - 99 mg/dL  Glucose, capillary     Status: Abnormal   Collection Time: 07/13/18  5:11 PM  Result Value Ref Range   Glucose-Capillary 161 (H) 70 - 99 mg/dL  Glucose, capillary     Status: Abnormal   Collection Time: 07/13/18  9:54 PM  Result Value Ref Range   Glucose-Capillary 147 (H) 70 - 99 mg/dL   Comment 1 Document in Chart   CBC with Differential/Platelet     Status: Abnormal   Collection Time: 07/14/18  4:11 AM  Result Value Ref Range   WBC 7.8 4.0 - 10.5 K/uL   RBC 4.18 (L) 4.22 - 5.81 MIL/uL   Hemoglobin 13.4 13.0 - 17.0 g/dL   HCT 85.2 77.8 - 24.2 %   MCV 98.1 80.0 - 100.0 fL   MCH 32.1 26.0 - 34.0 pg   MCHC 32.7 30.0 - 36.0 g/dL   RDW 35.3 61.4 - 43.1 %   Platelets 190 150 - 400 K/uL   nRBC 0.0 0.0 - 0.2 %   Neutrophils  Relative % 65 %   Neutro Abs 5.1 1.7 - 7.7 K/uL   Lymphocytes Relative 20 %   Lymphs Abs 1.5 0.7 - 4.0 K/uL   Monocytes Relative 12 %   Monocytes Absolute 1.0 0.1 - 1.0 K/uL   Eosinophils Relative 2 %   Eosinophils Absolute 0.1 0.0 - 0.5 K/uL   Basophils Relative 1 %   Basophils Absolute 0.0 0.0 - 0.1 K/uL   Immature Granulocytes 0 %   Abs Immature Granulocytes 0.03 0.00 - 0.07 K/uL  Renal function panel     Status: Abnormal   Collection Time: 07/14/18  4:11 AM  Result Value Ref Range   Sodium 137 135 - 145 mmol/L   Potassium 3.7 3.5 - 5.1 mmol/L   Chloride 105 98 - 111 mmol/L   CO2 25 22 - 32 mmol/L   Glucose, Bld 108 (H) 70 - 99 mg/dL   BUN 12 6 - 20 mg/dL   Creatinine, Ser 5.40 0.61 - 1.24 mg/dL   Calcium 8.2 (L) 8.9 -  10.3 mg/dL   Phosphorus 3.0 2.5 - 4.6 mg/dL   Albumin 3.1 (L) 3.5 - 5.0 g/dL   GFR calc non Af Amer >60 >60 mL/min   GFR calc Af Amer >60 >60 mL/min   Anion gap 7 5 - 15  Glucose, capillary     Status: Abnormal   Collection Time: 07/14/18  7:39 AM  Result Value Ref Range   Glucose-Capillary 106 (H) 70 - 99 mg/dL     Medical history and chart was reviewed  Assessment/Plan: 38 year old male s/p fall from ladder which resulted in Pipkin IV right hip fracture dislocation. Recommend proceeding with ORIF of posterior wall with possible ORIF of femoral head. Risks and benefits were discussed with the patient. Risks discussed included bleeding requiring blood transfusion, bleeding causing a hematoma, infection, malunion, nonunion, damage to surrounding nerves and blood vessels, pain, hardware prominence or irritation, hardware failure, stiffness, post-traumatic arthritis, DVT/PE, compartment syndrome, and even death. The patient would like to proceed with surgery. Questions were answered and consent was obtained.    Joseph Shannon [facility] ?[phone]? (phone)

## 2018-07-14 NOTE — Transfer of Care (Signed)
Immediate Anesthesia Transfer of Care Note  Patient: Joseph Shannon  Procedure(s) Performed: OPEN REDUCTION INTERNAL FIXATION ACETABULUM POSTERIOR LATERAL (Right Hip)  Patient Location: PACU  Anesthesia Type:General  Level of Consciousness: drowsy and patient cooperative  Airway & Oxygen Therapy: Patient Spontanous Breathing and Patient connected to face mask oxygen  Post-op Assessment: Report given to RN and Post -op Vital signs reviewed and stable  Post vital signs: Reviewed and stable  Last Vitals:  Vitals Value Taken Time  BP 121/102 07/14/2018  3:42 PM  Temp    Pulse 98 07/14/2018  3:45 PM  Resp 20 07/14/2018  3:45 PM  SpO2 97 % 07/14/2018  3:45 PM  Vitals shown include unvalidated device data.  Last Pain:  Vitals:   07/14/18 1100  TempSrc:   PainSc: 7          Complications: No apparent anesthesia complications

## 2018-07-14 NOTE — Anesthesia Postprocedure Evaluation (Signed)
Anesthesia Post Note  Patient: Joseph Shannon  Procedure(s) Performed: OPEN REDUCTION INTERNAL FIXATION ACETABULUM POSTERIOR LATERAL (Right Hip)     Patient location during evaluation: PACU Anesthesia Type: General Level of consciousness: awake and alert Pain management: pain level controlled Vital Signs Assessment: post-procedure vital signs reviewed and stable Respiratory status: spontaneous breathing, nonlabored ventilation, respiratory function stable and patient connected to nasal cannula oxygen Cardiovascular status: blood pressure returned to baseline and stable Postop Assessment: no apparent nausea or vomiting Anesthetic complications: no    Last Vitals:  Vitals:   07/14/18 1612 07/14/18 1630  BP: 132/75 121/71  Pulse: 93 84  Resp: (!) 37 17  Temp:  36.7 C  SpO2: 98% 97%    Last Pain:  Vitals:   07/14/18 1612  TempSrc:   PainSc: 8                  [name] DAVID

## 2018-07-14 NOTE — Progress Notes (Signed)
Pt has CPAP order.  Visited patients room to offer CPAP patient states he don't wear all the time and he don't want to wear it tonight.  I advised RN.

## 2018-07-14 NOTE — Plan of Care (Signed)
  Problem: Education: Goal: Verbalization of understanding the information provided (i.e., activity precautions, restrictions, etc) will improve Outcome: Progressing   Problem: Activity: Goal: Ability to ambulate and perform ADLs will improve Outcome: Progressing   Problem: Pain Management: Goal: Pain level will decrease Outcome: Progressing   

## 2018-07-14 NOTE — Anesthesia Postprocedure Evaluation (Signed)
Anesthesia Post Note  Patient: Joseph Shannon  Procedure(s) Performed: CLOSED REDUCTION HIP (Right ) INSERTION OF TRACTION PIN (Right )     Patient location during evaluation: PACU Anesthesia Type: General Level of consciousness: awake and alert Pain management: pain level controlled Vital Signs Assessment: post-procedure vital signs reviewed and stable Respiratory status: spontaneous breathing, nonlabored ventilation, respiratory function stable and patient connected to nasal cannula oxygen Cardiovascular status: blood pressure returned to baseline and stable Postop Assessment: no apparent nausea or vomiting Anesthetic complications: no    Last Vitals:  Vitals:   07/14/18 0416 07/14/18 1052  BP: 126/84 (!) 127/91  Pulse: 76 91  Resp: 15 16  Temp:  37 C  SpO2: 100% 97%    Last Pain:  Vitals:   07/14/18 1100  TempSrc:   PainSc: 7                  Kennieth Rad

## 2018-07-14 NOTE — Anesthesia Postprocedure Evaluation (Signed)
Anesthesia Post Note  Patient: Joseph Shannon  Procedure(s) Performed: OPEN REDUCTION INTERNAL FIXATION ACETABULUM POSTERIOR LATERAL (Right Hip)     Patient location during evaluation: PACU Anesthesia Type: General Level of consciousness: awake and alert Pain management: pain level controlled Vital Signs Assessment: post-procedure vital signs reviewed and stable Respiratory status: spontaneous breathing, nonlabored ventilation, respiratory function stable and patient connected to nasal cannula oxygen Cardiovascular status: blood pressure returned to baseline and stable Postop Assessment: no apparent nausea or vomiting Anesthetic complications: no    Last Vitals:  Vitals:   07/14/18 1612 07/14/18 1630  BP: 132/75 121/71  Pulse: 93 84  Resp: (!) 37 17  Temp:  36.7 C  SpO2: 98% 97%    Last Pain:  Vitals:   07/14/18 1630  TempSrc:   PainSc: Asleep                 [name] DAVID

## 2018-07-14 NOTE — Progress Notes (Signed)
[facility] PROGRESS NOTE  Joseph Shannon OTR:[id number] DOB: 06-Nov-1980 DOA: 07/12/2018 PCP: Joseph Colas, FNP   Narrative: 38 year old African-American male BMI 28 Resistant hypertension renal ultrasound performed Chronic systolic heart failure with chronic nonischemic cardiomyopathy History of TIA 2013 Diabetes with diabetic neuropathy Hyperlipidemia CKD stage II-III ?  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 Asthma   Patient was apparently cleaning gutters in the second story building fell off a ladder immediately had pain Pain was 8 on 10   A & Plan Complex hip fracture Currently in traction-pain mod controlled on IV meds Going for surgery 3/16 Fever tmax last pm >101. Monitor trends--ge tIS.  Encourage seated up all times when eating--wrk-up if further temp elevation Nonischemic cardiomyopathy Cont Bystolic 20 qd, aldactone 50 daily--seems euvolemic, lasix 40 bid on hold at this time, holding lisinopril, holding ASA Resistant hypertension Cont amlodipine 5, imdur 120, hydalazine 100 bid Well controlled so far Prior TIA 2000 13/14  Not on asa 81 Diabetes with polyneuropathy, A1c 5.3 this admit--well controlled Holding metformin at this time-cont gabapentin for neuropathy SSI Coverage a this time-sugars 106-108 on SSI Pineal gland cystic structure OP characterization BMI 28  OP management Asthma  DVT loveneox  Code Status: full  Communication: d/w mother  Disposition Plan: inpatient pending multiple ortho procedures anticipated    Joseph Moodie, MD  [facility] Via [employer] app OR [email or web address] 7PM-7AM contact night coverage as above 07/14/2018, 10:27 AM  LOS: 2 days   Consultants:  ortho  Procedures:  n  Antimicrobials:  n  Interval history/Subjective:  Sleepy in some pain Asking for morphine  note fever overngiht    Objective:  Vitals:  Vitals:   07/13/18 2012 07/14/18 0416  BP: 114/80 126/84  Pulse: 88 76  Resp:  15   Temp: 98.8 F (37.1 C)   SpO2: 96% 100%    Exam:  Alert pleasant no distress eomi ncat  cta b s1 s 2no m abd sof tnt nd no rebound LLE in traction   I have personally reviewed the following:  DATA   Labs:  Labs all reasonable  Imaging studies:  CT of right hip 3/15 IMPRESSION: 1. Interval reduction of the right hip dislocation. 2. Complex oblique coursing head split type fracture of the femoral head with numerous small bone fragments. 3. Avulsion type fracture involving the roof of the acetabulum. 4. Small fracture fragments along the posterior wall of the acetabulum without obvious donor site. Possible small avulsion fracture from the acetabulum or fragments from the humeral head. 5. Suspect right sacral fracture. Recommend dedicated full bony pelvic CT scan.    Scheduled Meds: . amLODipine  5 mg Oral Daily  . atorvastatin  40 mg Oral Daily  . budesonide  0.25 mg Nebulization BID  . docusate sodium  100 mg Oral BID  . enoxaparin (LOVENOX) injection  40 mg Subcutaneous Q24H  . gabapentin  300 mg Oral BID  . hydrALAZINE  100 mg Oral BID  . insulin aspart  0-5 Units Subcutaneous QHS  . insulin aspart  0-9 Units Subcutaneous TID WC  . isosorbide mononitrate  120 mg Oral Daily  . methocarbamol  500 mg Oral BID  . multivitamin with minerals  1 tablet Oral Daily  . mupirocin cream   Topical TID  . nebivolol  20 mg Oral Daily  . nicotine  21 mg Transdermal Daily  . omega-3 acid ethyl esters  1 g Oral Daily  . pantoprazole  40 mg Oral Daily  .  spironolactone  50 mg Oral Daily   Continuous Infusions: . methocarbamol (ROBAXIN) IV      Principal Problem:   Closed right hip fracture (HCC) Active Problems:   Hypertension   DOE (dyspnea on exertion)   DM (diabetes mellitus) (HCC)   Acute on chronic systolic heart failure (HCC)   Hypertensive heart disease   Tobacco use   Hyperlipidemia   Hip fracture (HCC)   LOS: 2 days

## 2018-07-14 NOTE — Op Note (Signed)
Orthopaedic Surgery Operative Note (CSN: [id number] ) Date of Surgery: 07/14/2018  Admit Date: 07/12/2018   Diagnoses: Pre-Op Diagnoses: Right Pipkin IV femoral head fracture dislocation   Post-Op Diagnosis: Same  Procedures: 1. CPT 27226-Open reduction internal fixation of right posterior wall acetabular fracture 2. CPT 27268-Nonoperative treatment of right femoral head fracture 3. CPT 20670-Removal of traction pin right femur   Surgeons : Primary: Roby Lofts, MD  Assistant: Ulyses Southward, PA-C  Location: OR 3   Anesthesia:General   Antibiotics: Ancef 2g preop   Tourniquet time:None  Estimated Blood Loss:350 mL  Complications:None  Specimens:None   Implants: Implant Name Type Inv. Item Serial No. Manufacturer Lot No. LRB No. Used Action  PLATE SPRING 3.3IR 3H STERILE - [id number] Plate PLATE SPRING 6.3KZ 3H STERILE  [employer] [id number] Right 1 Implanted  PLATE SPRING 2H 3.5MM PELVIC - [id number] Plate PLATE SPRING 2H 3.5MM PELVIC  SYN[employer] [id number] Right 1 Implanted  SCREW CORTEX 3.5 - [id number] Screw SCREW CORTEX 3.5  SY[employer]UMA  Right 2 Implanted  SCREW CORTEX 3.5 - [id number] Screw SCREW CORTEX 3.5  SYNTHES TRAUMA  Right 1 Implanted  SCREW CORTEX 3.5 - [id number] Screw SCREW CORTEX 3.5  SYNTHES TRAUMA  Right 1 Implanted  SCREW CORTEX 3.5 - [id number] Screw SCREW CORTEX 3.5  SYNTHES TRAUMA  Right 1 Implanted    Indications for Surgery: 38 year old male who fell off a ladder and sustained a right hip fracture dislocation with associated femoral head fracture and posterior wall fracture.  He was reduced by Dr. Susa Simmonds and placed a distal femoral traction.  I was consulted due to the complexity and the need for an orthopedic traumatologist.  Due to his young age I felt that open reduction internal fixation of the posterior wall and removal of loose bodies from the joint was most appropriate.  I felt that his femoral head  fracture was infra foveal and likely inconsequential to his weightbearing to.  As a result I felt that a surgical hip dislocation would be significant morbidity for the minimal benefit.  Risks and benefits were discussed with the patient including risk of bleeding, infection, malunion, nonunion, posttraumatic arthritis, avascular necrosis, heterotopic ossification, nerve and blood vessel injury, DVT.  The patient agreed to proceed with surgery and consent was obtained.  Operative Findings: 1.  Open reduction internal fixation of right posterior wall fracture using Synthes 2 hole and 3-hole spring plates. 2.  Removal of delaminated cartilage and loose bodies from the hip joint with nonoperative treatment of the Pipkin 1 femoral head fracture. 3.  Removal of traction pin from distal femur.  Procedure: The patient was identified in the preoperative holding area. Consent was confirmed with the patient and their family and all questions were answered. The operative extremity was marked after confirmation with the patient. he was then brought back to the operating room by our anesthesia colleagues.  The patient was then placed under general anesthetic.  The patient was then carefully transferred to a radiolucent flat top table.  Fluoroscopy was performed to verify that we could obtain adequate imaging. The operative extremity was then prepped and draped in usual sterile fashion. A preoperative timeout was performed to verify the patient, the procedure, and the extremity. Preoperative antibiotics were dosed.  I started out making a standard Kocher approach to the posterior hip.  I made the inferior limb and carried this down through skin and subcutaneous tissue through the IT band.  I then identified the tip of the greater trochanter and made the superior limb from this spot to the PSIS.  I went through the skin and subcutaneous tissue along this incision and then split the gluteus maximus fascia and fibers of  the gluteus maximus in line with the incision as well. A Charnley retractor was then placed underneath the IT band to retract the gluteus maximus out of the way.  Here I then identified the sciatic nerve carefully bluntly dissected to free this from the underlying structures.  I then resected portion of the greater trochanteric bursa.  Then identified the piriformis tendon tagged this with #1 Vicryl suture and resected this off the greater trochanter.  I followed this back to the greater sciatic notch and performed some excisional debridement of the piriformis muscule.  The conjoin tendon was then identified and I resected this off greater trochanter tagging this with a #1 Vicryl suture.  The superior and inferior gemelli were debrided with a rongeur obturator and internus tendon was then followed all the way back to the lesser sciatic notch.   There was a significant displacement of the posterior wall fragment underneath the gluteus minimus musculature.  This was retrieved and a thorough debridement of the minimus musculature was performed.  The posterior capsule was still intact and I incised this to visualize the joint appropriately.  There was a number of delaminated cartilage that was in the soft tissues that was debrided.  Using the distal femoral traction pin I had my assistant performed a longitudinal traction to clean out the joint.  There is a large delaminated cartilage fragment from the anterior femoral head that was removed.  The remainder of the acetabulum was thoroughly irrigated.  The femoral head fragment was able to be palpated but not visualized.  It remained stable to the ligamentum teres.  The hip was then placed back into the acetabulum.  The posterior wall fragment was then reduced back down to the cancellus base.  It was held provisionally with K wires.  I then placed a 3 hole and a 2 hole spring plate to provide purchase for the fragment.  There was a very thin fragment of a width of  less than 1.5 cm.  I felt that a buttress posterior wall plate would not provide adequate fixation.  I felt that the fixation was stable at this point in a another posterior buttress wall plate would not have provided any added significant benefit.  The hip was ranged through a gentle range of motion of flexion and internal rotation and it remained stable without any signs of subluxation or loosening of the hardware.  Final fluoroscopic images were obtained with PA, obturator oblique and iliac oblique.  All the screws were out of the joint.  I then copiously irrigated the wound with low pressure pulsatile lavage.  I placed 1 g of vancomycin powder and 1.2 g of tobramycin powder into the wound.  I repaired the piriformis and obturator internus tendon back to their stumps with #1 Vicryl.  The capsule that I incised was closed with #1 Vicryl as well. I then proceeded to close the IT band with interrupted 0 Vicryl suture.  The skin was closed with 2-0 Vicryl and 2-0 nylon.    A Mepilex dressing was placed over the incision.Marland Kitchen  He was able to be extubated and taken to the PACU in stable condition.  Post Op Plan/Instructions: Patient will be touchdown weightbearing to the right lower extremity.  He will  be placed on posterior hip precautions.  He will be started on Lovenox for DVT prophylaxis postoperative day 1.  He will receive postoperative Ancef.  He will mobilize with physical therapy  I was present and performed the entire surgery.  Ulyses Southward, PA-C did assist me throughout the case. An assistant was necessary given the difficulty in approach, maintenance of reduction and ability to instrument the fracture.   Truitt Merle, MD [facility]

## 2018-07-14 NOTE — Anesthesia Procedure Notes (Signed)
Procedure Name: Intubation Date/Time: 07/14/2018 12:25 PM Performed by: Marny Lowenstein, CRNA Pre-anesthesia Checklist: Patient identified, Emergency Drugs available, Suction available and Patient being monitored Patient Re-evaluated:Patient Re-evaluated prior to induction Oxygen Delivery Method: Circle system utilized Preoxygenation: Pre-oxygenation with 100% oxygen Induction Type: IV induction Ventilation: Mask ventilation without difficulty and Oral airway inserted - appropriate to patient size Laryngoscope Size: Hyacinth Meeker and 3 Grade View: Grade I Tube type: Oral Tube size: 7.5 mm Number of attempts: 1 Airway Equipment and Method: Patient positioned with wedge pillow and Stylet Placement Confirmation: ETT inserted through vocal cords under direct vision,  positive ETCO2 and breath sounds checked- equal and bilateral Secured at: 24 cm Tube secured with: Tape Dental Injury: Teeth and Oropharynx as per pre-operative assessment

## 2018-07-15 ENCOUNTER — Encounter (HOSPITAL_COMMUNITY): Payer: Self-pay | Admitting: Student

## 2018-07-15 LAB — RENAL FUNCTION PANEL
Albumin: 3.3 g/dL — ABNORMAL LOW (ref 3.5–5.0)
Anion gap: 8 (ref 5–15)
BUN: 9 mg/dL (ref 6–20)
CHLORIDE: 104 mmol/L (ref 98–111)
CO2: 27 mmol/L (ref 22–32)
Calcium: 8.8 mg/dL — ABNORMAL LOW (ref 8.9–10.3)
Creatinine, Ser: 0.96 mg/dL (ref 0.61–1.24)
GFR calc Af Amer: >60 mL/min (ref >60)
GFR calc non Af Amer: >60 mL/min (ref >60)
Glucose, Bld: 153 mg/dL — ABNORMAL HIGH (ref 70–99)
Phosphorus: 2 mg/dL — ABNORMAL LOW (ref 2.5–4.6)
Potassium: 4.4 mmol/L (ref 3.5–5.1)
Sodium: 139 mmol/L (ref 135–145)

## 2018-07-15 LAB — GLUCOSE, CAPILLARY
Glucose-Capillary: 92 mg/dL (ref 70–99)
Glucose-Capillary: 158 mg/dL — ABNORMAL HIGH (ref 70–99)
Glucose-Capillary: 103 mg/dL — ABNORMAL HIGH (ref 70–99)
Glucose-Capillary: 174 mg/dL — ABNORMAL HIGH (ref 70–99)

## 2018-07-15 LAB — CBC WITH DIFFERENTIAL/PLATELET
Abs Immature Granulocytes: 0.03 10*3/uL (ref 0.00–0.07)
Basophils Absolute: 0 10*3/uL (ref 0.0–0.1)
Basophils Relative: 0 %
Eosinophils Absolute: 0 10*3/uL (ref 0.0–0.5)
Eosinophils Relative: 0 %
HCT: 37.7 % — ABNORMAL LOW (ref 39.0–52.0)
Hemoglobin: 12.3 g/dL — ABNORMAL LOW (ref 13.0–17.0)
Immature Granulocytes: 0 %
Lymphocytes Relative: 8 %
Lymphs Abs: 0.8 10*3/uL (ref 0.7–4.0)
MCH: 31.4 pg (ref 26.0–34.0)
MCHC: 32.6 g/dL (ref 30.0–36.0)
MCV: 96.2 fL (ref 80.0–100.0)
MONOS PCT: 11 %
Monocytes Absolute: 1.2 10*3/uL — ABNORMAL HIGH (ref 0.1–1.0)
Neutro Abs: 8.7 10*3/uL — ABNORMAL HIGH (ref 1.7–7.7)
Neutrophils Relative %: 81 %
Platelets: 203 10*3/uL (ref 150–400)
RBC: 3.92 MIL/uL — ABNORMAL LOW (ref 4.22–5.81)
RDW: 11.6 % (ref 11.5–15.5)
WBC: 10.7 10*3/uL — ABNORMAL HIGH (ref 4.0–10.5)
nRBC: 0 % (ref 0.0–0.2)

## 2018-07-15 MED ORDER — METFORMIN HCL 500 MG PO TABS
500.0000 mg | ORAL_TABLET | Freq: Two times a day (BID) | ORAL | Status: DC
  Administered 2018-07-15 – 2018-07-17 (×5): 500 mg via ORAL
  Filled 2018-07-15 (×5): qty 1

## 2018-07-15 MED ORDER — FUROSEMIDE 40 MG PO TABS
40.0000 mg | ORAL_TABLET | Freq: Every day | ORAL | Status: DC
  Administered 2018-07-15 – 2018-07-17 (×3): 40 mg via ORAL
  Filled 2018-07-15 (×3): qty 1

## 2018-07-15 MED ORDER — ALBUTEROL SULFATE (2.5 MG/3ML) 0.083% IN NEBU: 3.0000 mL | INHALATION_SOLUTION | Freq: Four times a day (QID) | RESPIRATORY_TRACT | Status: DC | PRN

## 2018-07-15 NOTE — Progress Notes (Signed)
Orthopaedic Trauma Progress Note  S: Patient doing well this morning, pain well controlled. Plans to work with therapies today. Wants to know how long he will need to be in hospital. No other concerns of note.  O:  Vitals:   07/14/18 2337 07/15/18 0549  BP: 140/83 138/86  Pulse: 87 71  Resp: 16 18  Temp: 98.2 F (36.8 C) 98.7 F (37.1 C)  SpO2: 99% 99%   General - resting in bed comofortably, NAD Cardiac - heart regular rate and rhtyhm Respiratory - CTA anterior lung fields bilaterally RLE - dressing in place with mild strike-through. Minimal pain to palpation of hip, knee, or leg. Minimal swelling or bruising noted. Full ankle ROM. Passive knee ROM without significant discomfort. Sensory and motor function intact. Neurovascularly intact  Imaging: stable post op imaging  Labs:  Results for orders placed or performed during the hospital encounter of 07/12/18 (from the past 24 hour(s))  Surgical pcr screen     Status: None   Collection Time: 07/14/18  8:41 AM  Result Value Ref Range   MRSA, PCR NEGATIVE NEGATIVE   Staphylococcus aureus NEGATIVE NEGATIVE  Glucose, capillary     Status: Abnormal   Collection Time: 07/14/18 11:02 AM  Result Value Ref Range   Glucose-Capillary 100 (H) 70 - 99 mg/dL  Glucose, capillary     Status: Abnormal   Collection Time: 07/14/18  3:41 PM  Result Value Ref Range   Glucose-Capillary 135 (H) 70 - 99 mg/dL   Comment 1 Notify RN    Comment 2 Document in Chart   Glucose, capillary     Status: Abnormal   Collection Time: 07/14/18  4:57 PM  Result Value Ref Range   Glucose-Capillary 121 (H) 70 - 99 mg/dL  Glucose, capillary     Status: Abnormal   Collection Time: 07/14/18 10:14 PM  Result Value Ref Range   Glucose-Capillary 242 (H) 70 - 99 mg/dL  Renal function panel     Status: Abnormal   Collection Time: 07/15/18  5:30 AM  Result Value Ref Range   Sodium 139 135 - 145 mmol/L   Potassium 4.4 3.5 - 5.1 mmol/L   Chloride 104 98 - 111 mmol/L    CO2 27 22 - 32 mmol/L   Glucose, Bld 153 (H) 70 - 99 mg/dL   BUN 9 6 - 20 mg/dL   Creatinine, Ser 7.42 0.61 - 1.24 mg/dL   Calcium 8.8 (L) 8.9 - 10.3 mg/dL   Phosphorus 2.0 (L) 2.5 - 4.6 mg/dL   Albumin 3.3 (L) 3.5 - 5.0 g/dL   GFR calc non Af Amer >60 >60 mL/min   GFR calc Af Amer >60 >60 mL/min   Anion gap 8 5 - 15  CBC with Differential/Platelet     Status: Abnormal   Collection Time: 07/15/18  5:30 AM  Result Value Ref Range   WBC 10.7 (H) 4.0 - 10.5 K/uL   RBC 3.92 (L) 4.22 - 5.81 MIL/uL   Hemoglobin 12.3 (L) 13.0 - 17.0 g/dL   HCT 59.5 (L) 63.8 - 75.6 %   MCV 96.2 80.0 - 100.0 fL   MCH 31.4 26.0 - 34.0 pg   MCHC 32.6 30.0 - 36.0 g/dL   RDW 43.3 29.5 - 18.8 %   Platelets 203 150 - 400 K/uL   nRBC 0.0 0.0 - 0.2 %   Neutrophils Relative % 81 %   Neutro Abs 8.7 (H) 1.7 - 7.7 K/uL   Lymphocytes Relative 8 %  Lymphs Abs 0.8 0.7 - 4.0 K/uL   Monocytes Relative 11 %   Monocytes Absolute 1.2 (H) 0.1 - 1.0 K/uL   Eosinophils Relative 0 %   Eosinophils Absolute 0.0 0.0 - 0.5 K/uL   Basophils Relative 0 %   Basophils Absolute 0.0 0.0 - 0.1 K/uL   Immature Granulocytes 0 %   Abs Immature Granulocytes 0.03 0.00 - 0.07 K/uL    Assessment: 38 year old male s/p fall from ladder  Injuries: Right Pipkin IV femoral head fracture dislocation s/p ORIF right posterior wall acetabular fracture, nonoperative treatment of right femoral head fracture, removal of traction pin right femur   Weightbearing: TDWB RLE with posterior hip precautions  Insicional and dressing care: dressing with mild strike-through. Will change tomorrow  Orthopedic device(s): None  CV/Blood loss: Acute blood loss anemia. Hgb 12.4 this AM, hemodynamically stable   Pain management: 1. Tylenol 650 mg q 6 hours scheduled 2. Morphine 2 mg q 2 hours PRN 3. Oxycodone 5-10 mg q 4 hours PRN 4. Neurontin 300 mg TID 5. Robaxin 500 mg q 6 hours PRN  VTE prophylaxis: Lovenox started today  ID: Ancef 2gm post  operatively  Foley/Lines: No foley, KVO IVFs  Medical co-morbidities: HTN, CHF, Diabetes  Impediments to Fracture Healing: Diabetes, Tobacco use  Dispo: PT/OT eval, dispo pending  Follow - up plan: 2 weeks   Ainslie Mazurek A. Ladonna Sn[facility] ?[phone]? (phone)

## 2018-07-15 NOTE — Progress Notes (Addendum)
[facility] PROGRESS NOTE  Bowen Mijares WYS:[id number] DOB: 12/07/1980 DOA: 07/12/2018 PCP: Eather Colas, FNP   Narrative: 38 year old African-American male BMI 28 Resistant hypertension renal ultrasound performed Chronic systolic heart failure with chronic nonischemic cardiomyopathy History of TIA 2013 Diabetes with diabetic neuropathy Hyperlipidemia CKD stage II-III ?  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 Asthma   Patient was apparently cleaning gutters in the second story building fell off a ladder immediately had pain Pain was 8 on 10   A & Plan Complex hip fracture Currently in traction-pain mod controlled on IV meds Underwent hip repair 3/16 Pain mod controlled-rest per ortho Fever tmax  >101 3/15--no recurrence-no further work-up Nonischemic cardiomyopathy Cont Bystolic 20 qd, aldactone 50 daily--seems euvolemic, lasix 40 bid resumed 3/17, holding lisinopril, holding ASA Resistant hypertension Cont amlodipine 5, imdur 120, hydalazine 100 bid Well controlled so far Prior TIA 2000 13/14  Not on asa 81 Diabetes with polyneuropathy, A1c 5.3 this admit--well controlled Resuming metformin at this time, continue SSI while in house-cont gabapentin for neuropathy SSI Coverage a this time-sugars 92-153 Pineal gland cystic structure OP characterization BMI 28  OP management Asthma  DVT loveneox  Code Status: full  Communication: d/w mother  Disposition Plan: await therapy evals--likely d/c in 24-48 hours dependant on this   Hollyann Pablo, MD  [facility] Via [employer] app OR [email or web address] 7PM-7AM contact night coverage as above 07/15/2018, 10:47 AM  LOS: 3 days   Consultants:  ortho  Procedures:  n  Antimicrobials:  n  Interval history/Subjective:  Doesn't liek diet Overall feels well anxious to get up  Pain controlled to some degree  Objective:  Vitals:  Vitals:   07/14/18 2337 07/15/18 0549  BP: 140/83 138/86  Pulse: 87 71   Resp: 16 18  Temp: 98.2 F (36.8 C) 98.7 F (37.1 C)  SpO2: 99% 99%    Exam:  Alert pleasant no distress eomi ncat  Thick neck-no jvd no bruit cta b. No rales nor rhonchi s1 s 2no m abd sof tnt nd no rebound LLE in traction   I have personally reviewed the following:  DATA   Labs:  Wbc slightly up from 7.8-->10.7, hemoglobin down from 13.4-->12.3  Imaging studies:  CT of right hip 3/15 IMPRESSION: 1. Interval reduction of the right hip dislocation. 2. Complex oblique coursing head split type fracture of the femoral head with numerous small bone fragments. 3. Avulsion type fracture involving the roof of the acetabulum. 4. Small fracture fragments along the posterior wall of the acetabulum without obvious donor site. Possible small avulsion fracture from the acetabulum or fragments from the humeral head. 5. Suspect right sacral fracture. Recommend dedicated full bony pelvic CT scan.    Scheduled Meds: . acetaminophen  650 mg Oral Q6H  . amLODipine  5 mg Oral Daily  . atorvastatin  40 mg Oral Daily  . budesonide  0.25 mg Nebulization BID  . docusate sodium  100 mg Oral BID  . enoxaparin (LOVENOX) injection  40 mg Subcutaneous Q24H  . furosemide  40 mg Oral Daily  . gabapentin  300 mg Oral TID  . hydrALAZINE  100 mg Oral BID  . insulin aspart  0-5 Units Subcutaneous QHS  . insulin aspart  0-9 Units Subcutaneous TID WC  . isosorbide mononitrate  120 mg Oral Daily  . metFORMIN  500 mg Oral BID WC  . multivitamin with minerals  1 tablet Oral Daily  . mupirocin cream   Topical TID  . nebivolol  20 mg Oral Daily  . nicotine  21 mg Transdermal Daily  . omega-3 acid ethyl esters  1 g Oral Daily  . pantoprazole  40 mg Oral Daily  . spironolactone  50 mg Oral Daily   Continuous Infusions: .  ceFAZolin (ANCEF) IV 2 g (07/15/18 0601)  . lactated ringers 10 mL/hr at 07/14/18 1129  . methocarbamol (ROBAXIN) IV      Principal Problem:   Closed right hip fracture  (HCC) Active Problems:   Hypertension   DOE (dyspnea on exertion)   DM (diabetes mellitus) (HCC)   Acute on chronic systolic heart failure (HCC)   Hypertensive heart disease   Tobacco use   Hyperlipidemia   Hip fracture (HCC)   Fracture of femoral head (HCC)   Closed posterior wall fracture of right acetabulum (HCC)   Closed dislocation of right hip (HCC)   LOS: 3 days

## 2018-07-15 NOTE — Evaluation (Signed)
Physical Therapy Evaluation Patient Details Name: Joseph Shannon MRN: [id number] DOB: Apr 19, 1981 Today's Date: 07/15/2018   History of Present Illness  Joseph Shannon is an 38 y.o. male with history of hypertensive cardiomyopathy with diastolic and systolic dysfunction, diabetes, hyperlipidemia, asthma, previous TIAs who was apparently Cleaning the gutters of a second story building at home when he fell off the ladder and fell on his right side. Immediate pain R hip; now s/p Closed reduction of right femoral head fracture dislocation under anesthesia with manipulation  Clinical Impression   Patient is s/p above surgery resulting in functional limitations due to the deficits listed below (see PT Problem List). Independent prior t admission; Presents with decr functional mobility, weight bearing and position restrictions, pain; Was very anxious to move at first, but walking well and maintaining TWB by the end of the session; Will plan for stair training tomorrow;  Patient will benefit from skilled PT to increase their independence and safety with mobility to allow discharge to the venue listed below.       Follow Up Recommendations Home health PT    Equipment Recommendations  Rolling walker with 5" wheels;3in1 (PT)    Recommendations for Other Services OT consult(as ordered)     Precautions / Restrictions Precautions Precautions: Posterior Hip Precaution Booklet Issued: Yes (comment) Precaution Comments: Educated pt and his mother on Posterior Hip Prec; anticipate he will need reinforcement Restrictions Weight Bearing Restrictions: Yes RLE Weight Bearing: Touchdown weight bearing      Mobility  Bed Mobility Overal bed mobility: Needs Assistance Bed Mobility: Supine to Sit     Supine to sit: Mod assist     General bed mobility comments: Cues for technqiue; initially mod assist to help move pelvis towards EOB; Very hesitant to move, but came to sit without assistance,  pulling on rail  Transfers Overall transfer level: Needs assistance Equipment used: Rolling walker (2 wheeled) Transfers: Sit to/from Stand Sit to Stand: Min assist         General transfer comment: min assist t steady RW; cues for Post hip prec  Ambulation/Gait Ambulation/Gait assistance: Min guard Gait Distance (Feet): 100 Feet Assistive device: Rolling walker (2 wheeled) Gait Pattern/deviations: Step-to pattern     General Gait Details: Very nice maintenance of TWB RLE  Stairs            Wheelchair Mobility    Modified Rankin (Stroke Patients Only)       Balance                                             Pertinent Vitals/Pain Pain Assessment: 0-10 Pain Score: 8  Pain Location: R hip Pain Descriptors / Indicators: Aching;Grimacing;Guarding Pain Intervention(s): Monitored during session;Premedicated before session    Home Living Family/patient expects to be discharged to:: Private residence Living Arrangements: Parent Available Help at Discharge: Family;Available PRN/intermittently Type of Home: House Home Access: Stairs to enter Entrance Stairs-Rails: None Entrance Stairs-Number of Steps: 1 Home Layout: Two level;1/2 bath on main level        Prior Function Level of Independence: Independent               Hand Dominance        Extremity/Trunk Assessment   Upper Extremity Assessment Upper Extremity Assessment: Defer to OT evaluation    Lower Extremity Assessment Lower Extremity Assessment: RLE deficits/detail RLE Deficits /  Details: Grossly decr AROM and strength, limited by pain and anitcipation of pain postop       Communication   Communication: No difficulties  Cognition Arousal/Alertness: Awake/alert Behavior During Therapy: WFL for tasks assessed/performed Overall Cognitive Status: Within Functional Limits for tasks assessed                                        General Comments       Exercises     Assessment/Plan    PT Assessment Patient needs continued PT services  PT Problem List Decreased strength;Decreased range of motion;Decreased activity tolerance;Decreased balance;Decreased mobility;Decreased coordination;Decreased knowledge of use of DME;Decreased knowledge of precautions;Pain       PT Treatment Interventions DME instruction;Gait training;Stair training;Functional mobility training;Therapeutic activities;Therapeutic exercise;Balance training;Patient/family education    PT Goals (Current goals can be found in the Care Plan section)  Acute Rehab PT Goals Patient Stated Goal: be able to get home PT Goal Formulation: With patient Time For Goal Achievement: 07/22/18 Potential to Achieve Goals: Good    Frequency Min 6X/week   Barriers to discharge        Co-evaluation               AM-PAC PT "6 Clicks" Mobility  Outcome Measure Help needed turning from your back to your side while in a flat bed without using bedrails?: A Lot Help needed moving from lying on your back to sitting on the side of a flat bed without using bedrails?: A Lot Help needed moving to and from a bed to a chair (including a wheelchair)?: A Little Help needed standing up from a chair using your arms (e.g., wheelchair or bedside chair)?: A Little Help needed to walk in hospital room?: A Little Help needed climbing 3-5 steps with a railing? : A Little 6 Click Score: 16    End of Session Equipment Utilized During Treatment: Gait belt Activity Tolerance: Patient tolerated treatment well Patient left: Other (comment)(Ended session with handoff to OT) Nurse Communication: Mobility status PT Visit Diagnosis: Unsteadiness on feet (R26.81);Other abnormalities of gait and mobility (R26.89);Pain Pain - Right/Left: Right Pain - part of body: Hip    Time: 1034-1050 PT Time Calculation (min) (ACUTE ONLY): 16 min   Charges:   PT Evaluation $PT Eval Moderate Complexity: 1  Mod          Van Clines, [place]  Acute Rehabilitation Services Pager [phone] Office [phone]   Levi Aland 07/15/2018, 12:25 PM

## 2018-07-15 NOTE — Progress Notes (Signed)
Occupational Therapy Evaluation Patient Details Name: Joseph Shannon MRN: [id number] DOB: 12/17/1980 Today's Date: 07/15/2018    History of Present Illness Joseph Shannon is an 38 y.o. male with history of hypertensive cardiomyopathy with diastolic and systolic dysfunction, diabetes, hyperlipidemia, asthma, previous TIAs who was apparently Cleaning the gutters of a second story building at home when he fell off the ladder and fell on his right side. Immediate pain R hip; now s/p Closed reduction of right femoral head fracture dislocation under anesthesia with manipulation   Clinical Impression   PTA pt PLOF independent in all ADL and IADLs. Currently requiring assistance with functional transfer, LB ADLs, and functional tasks due to pain and functional limitiations. Pt will benefit from continued skilled acute therapy to maximize independence and to prepare for safe transition to home setting for engagement in ADLs. DC recommendation home health OT to ensure safe transition and function. OT will continue to follow acutely.    Follow Up Recommendations  Follow surgeon's recommendation for DC plan and follow-up therapies;Supervision - Intermittent;Home health OT    Equipment Recommendations  3 in 1 bedside commode    Recommendations for Other Services       Precautions / Restrictions Precautions Precautions: Posterior Hip Precaution Booklet Issued: Yes (comment) Precaution Comments: Educated pt and his mother on Posterior Hip Prec; anticipate he will need reinforcement Restrictions Weight Bearing Restrictions: Yes RLE Weight Bearing: Touchdown weight bearing      Mobility Bed Mobility Overal bed mobility: Needs Assistance Bed Mobility: Supine to Sit     Supine to sit: Mod assist     General bed mobility comments: Cues for technqiue; initially mod assist to help move pelvis towards EOB; Very hesitant to move, but came to sit without assistance, pulling on  rail  Transfers Overall transfer level: Needs assistance Equipment used: Rolling walker (2 wheeled) Transfers: Sit to/from Stand Sit to Stand: Min assist         General transfer comment: min assist t steady RW; cues for Post hip prec    Balance Overall balance assessment: Needs assistance Sitting-balance support: No upper extremity supported Sitting balance-Leahy Scale: Good     Standing balance support: Bilateral upper extremity supported Standing balance-Leahy Scale: Poor Standing balance comment: Pt reliant of RW.                           ADL either performed or assessed with clinical judgement   ADL Overall ADL's : Needs assistance/impaired Eating/Feeding: Independent;Sitting   Grooming: Wash/dry hands;Wash/dry face;Oral care;Set up;Sitting       Lower Body Bathing: Moderate assistance;Adhering to hip precautions Lower Body Bathing Details (indicate cue type and reason): Pt able to wash LLE however requires assist with RLE     Lower Body Dressing: Moderate assistance Lower Body Dressing Details (indicate cue type and reason): Pt able to don/doff LLE sock, requires assist with RLE dressing. Toilet Transfer: Min guard;Cueing for safety;Ambulation;Regular Toilet;RW Toilet Transfer Details (indicate cue type and reason): Min gaurd for safety. Pt impulsive required cues for safe transfer.          Functional mobility during ADLs: Min guard;Rolling walker;Cueing for safety;Cueing for sequencing General ADL Comments: Pt educated about posterior hip orecautions in relation to ADL and IADLs. Also the importance of sitting to engage in ADL tasks.     Vision         Perception     Praxis      Pertinent Vitals/Pain Pain  Assessment: 0-10 Pain Score: 8  Pain Location: R hip Pain Descriptors / Indicators: Aching;Grimacing;Guarding Pain Intervention(s): Monitored during session;Premedicated before session;Ice applied     Hand Dominance      Extremity/Trunk Assessment Upper Extremity Assessment Upper Extremity Assessment: Defer to OT evaluation;Overall [facility] for tasks assessed   Lower Extremity Assessment Lower Extremity Assessment: RLE deficits/detail;Defer to PT evaluation       Communication Communication Communication: No difficulties   Cognition Arousal/Alertness: Awake/alert Behavior During Therapy: WFL for tasks assessed/performed Overall Cognitive Status: Within Functional Limits for tasks assessed                                     General Comments  Mother present during session. Educated of precuations     Exercises     Shoulder Instructions      Home Living Family/patient expects to be discharged to:: Private residence Living Arrangements: Parent Available Help at Discharge: Family;Available PRN/intermittently Type of Home: House Home Access: Stairs to enter [employer] of Steps: 1 Entrance Stairs-Rails: None Home Layout: Two level;1/2 bath on main level Alternate Level Stairs-Number of Steps: 18 Alternate Level Stairs-Rails: Left                      Prior Functioning/Environment Level of Independence: Independent                 OT Problem List: Impaired balance (sitting and/or standing);Decreased safety awareness;Decreased knowledge of use of DME or AE;Decreased knowledge of precautions;Pain      OT Treatment/Interventions: Self-care/ADL training;Therapeutic exercise;Therapeutic activities;Patient/family education;Balance training    OT Goals(Current goals can be found in the care plan section) Acute Rehab OT Goals Patient Stated Goal: be able to get home OT Goal Formulation: With patient/family Time For Goal Achievement: 07/29/18 Potential to Achieve Goals: Good  OT Frequency: Min 2X/week   Barriers to D/C:            Co-evaluation              AM-PAC OT "6 Clicks" Daily Activity     Outcome Measure Help from another person eating  meals?: None Help from another person taking care of personal grooming?: A Little Help from another person toileting, which includes using toliet, bedpan, or urinal?: A Little Help from another person bathing (including washing, rinsing, drying)?: A Little Help from another person to put on and taking off regular upper body clothing?: None Help from another person to put on and taking off regular lower body clothing?: A Little 6 Click Score: 20   End of Session Equipment Utilized During Treatment: Gait belt;Rolling walker Nurse Communication: Mobility status;Precautions  Activity Tolerance: Patient tolerated treatment well Patient left: in chair;with call bell/phone within reach;with chair alarm set;with family/visitor present  OT Visit Diagnosis: Unsteadiness on feet (R26.81);Muscle weakness (generalized) (M62.81);Pain;History of falling (Z91.81) Pain - Right/Left: Right Pain - part of body: Hip                Time: 2841-3244 OT Time Calculation (min): 29 min Charges:  OT General Charges $OT Visit: 1 Visit OT Evaluation $OT Eval Low Complexity: 1 Low OT Treatments $Self Care/Home Management : 8-22 mins  Marquette Old, MSOT, OTR/L  [facility]  [phone]  Zigmund Daniel 07/15/2018, 2:39 PM

## 2018-07-15 NOTE — Plan of Care (Signed)
  Problem: Activity: Goal: Ability to ambulate and perform ADLs will improve Outcome: Progressing   Problem: Pain Management: Goal: Pain level will decrease Outcome: Progressing   

## 2018-07-16 ENCOUNTER — Encounter (HOSPITAL_COMMUNITY): Payer: Self-pay | Admitting: *Deleted

## 2018-07-16 DIAGNOSIS — I5022 Chronic systolic (congestive) heart failure: Secondary | ICD-10-CM

## 2018-07-16 DIAGNOSIS — I10 Essential (primary) hypertension: Secondary | ICD-10-CM

## 2018-07-16 LAB — GLUCOSE, CAPILLARY
Glucose-Capillary: 103 mg/dL — ABNORMAL HIGH (ref 70–99)
Glucose-Capillary: 119 mg/dL — ABNORMAL HIGH (ref 70–99)
Glucose-Capillary: 93 mg/dL (ref 70–99)
Glucose-Capillary: 164 mg/dL — ABNORMAL HIGH (ref 70–99)

## 2018-07-16 NOTE — Progress Notes (Signed)
Orthopaedic Trauma Progress Note  S: Patient doing well this morning, pain well controlled. Plans to work with therapies again today. Does not feel that he is quite ready to be discharged, patient and his mother think he would benefit from one more day in the hospital to work with therapies. No other concerns of note.  O:  Vitals:   07/15/18 2148 07/16/18 0345  BP: 121/81 136/89  Pulse: 88 82  Resp: 16 16  Temp: 99.3 F (37.4 C) 98.6 F (37 C)  SpO2: 97% 98%   General - sitting up in bed, NAD Cardiac - heart regular rate and rhtyhm Respiratory - CTA anterior lung fields bilaterally RLE - dressing in place with mild strike-through. Dressing removed and incision is clean, dry, intact. Minimal pain to palpation of hip, knee, or leg. Minimal swelling or bruising noted. Full ankle ROM. Passive knee ROM without significant discomfort. Sensory and motor function intact. Neurovascularly intact  Imaging: stable post op imaging  Labs:  Results for orders placed or performed during the hospital encounter of 07/12/18 (from the past 24 hour(s))  Glucose, capillary     Status: None   Collection Time: 07/15/18  8:33 AM  Result Value Ref Range   Glucose-Capillary 92 70 - 99 mg/dL  Glucose, capillary     Status: Abnormal   Collection Time: 07/15/18 11:53 AM  Result Value Ref Range   Glucose-Capillary 158 (H) 70 - 99 mg/dL  Glucose, capillary     Status: Abnormal   Collection Time: 07/15/18  5:26 PM  Result Value Ref Range   Glucose-Capillary 103 (H) 70 - 99 mg/dL  Glucose, capillary     Status: Abnormal   Collection Time: 07/15/18  9:45 PM  Result Value Ref Range   Glucose-Capillary 174 (H) 70 - 99 mg/dL  Glucose, capillary     Status: Abnormal   Collection Time: 07/16/18  7:41 AM  Result Value Ref Range   Glucose-Capillary 103 (H) 70 - 99 mg/dL    Assessment: 38 year old male s/p fall from ladder  Injuries: Right Pipkin IV femoral head fracture dislocation s/p ORIF right posterior  wall acetabular fracture, nonoperative treatment of right femoral head fracture, removal of traction pin right femur   Weightbearing: TDWB RLE with posterior hip precautions  Insicional and dressing care: Incision clean, dry, intact. Dressing can be changed as needed Orthopedic device(s): None  CV/Blood loss: Acute blood loss anemia. Hgb 12.4 yesterday AM, hemodynamically stable   Pain management: 1. Tylenol 650 mg q 6 hours scheduled 2. Morphine 2 mg q 2 hours PRN 3. Oxycodone 5-10 mg q 4 hours PRN 4. Neurontin 300 mg TID 5. Robaxin 500 mg q 6 hours PRN  VTE prophylaxis: Lovenox 40 mg daily  ID: Ancef 2gm post operatively - completed  Foley/Lines: No foley, KVO IVFs  Medical co-morbidities: HTN, CHF, Diabetes  Impediments to Fracture Healing: Diabetes, Tobacco use  Dispo: PT/OT eval, recommending home health therapy. Will likely discharge home tomorrow  Follow - up plan: 2 weeks   Michaeline Eckersley A. Ladonna Sn[facility] ?[phone]? (phone)

## 2018-07-16 NOTE — TOC Initial Note (Signed)
Transition of Care [facility]) - Initial/Assessment Note    Patient Details  Name: Joseph Shannon MRN: [id number] Date of Birth: 06-03-1980  [facility] CM/SW Contact:    Durenda Guthrie, RN Phone Number: 07/16/2018, 10:47 AM  Clinical Narrative:    38 yr old male admitted with a fracture of right femur. Patient underwent a right femur ORIF.             Expected Discharge Plan: Home w Home Health Services Barriers to Discharge: Other (comment)(pain control)   Patient Goals and CMS Choice Patient states their goals for this hospitalization and ongoing recovery are:: get better   Choice offered to / list presented to : Patient  Expected Discharge Plan and Services Expected Discharge Plan: Home w Home Health Services Discharge Planning Services: CM Consult   Living arrangements for the past 2 months: Apartment                 DME Arranged: 3-N-1, Walker rolling DME Agency: [employer] HH Arranged: PT HH Agency: [facility]  Prior Living Arrangements/Services Living arrangements for the past 2 months: Apartment Lives with:: Self Patient language and need for interpreter reviewed:: Yes Do you feel safe going back to the place where you live?: Yes        Care giver support system in place?: Yes (comment)      Activities of Daily Living      Permission Sought/Granted Permission sought to share information with : Case Manager                Emotional Assessment Appearance:: Appears stated age   Affect (typically observed): Accepting Orientation: : Oriented to Self, Oriented to Place, Oriented to  Time, Oriented to Situation Alcohol / Substance Use: Not Applicable    Admission diagnosis:  Fall, initial encounter [W19.XXXA] Closed fracture dislocation of right hip joint, initial encounter [facility]) [S72.001A] Patient Active Problem List   Diagnosis Date Noted  . Fracture of femoral head (HCC) 07/14/2018  . Closed posterior wall fracture of  right acetabulum (HCC) 07/14/2018  . Closed dislocation of right hip (HCC) 07/14/2018  . Closed right hip fracture (HCC) 07/12/2018  . Hip fracture (HCC) 07/12/2018  . Hyperlipidemia 07/24/2016  . History of TIA (transient ischemic attack) 07/13/2016  . Fever blister 06/12/2016  . Gastroenteritis 06/12/2016  . Mild intermittent asthma with exacerbation 06/12/2016  . Tobacco use 03/26/2016  . Diverticulitis of intestine without perforation or abscess without bleeding 12/20/2015  . Family history of malignant neoplasm of colon in first degree relative diagnosed when younger than 38 years of age 75/22/2017  . Hypertensive heart disease 08/31/2015  . Sweating 08/31/2015  . Sinus tarsitis of left foot 07/28/2015  . Acute on chronic systolic heart failure (HCC) 09/09/2014  . Sleep apnea 09/09/2014  . Diabetes mellitus (HCC) 03/08/2014  . DM (diabetes mellitus) (HCC) 08/20/2013  . Chest pain 08/19/2013  . Paresthesias 08/19/2013  . DOE (dyspnea on exertion) 08/19/2013  . Hypertensive urgency 09/19/2012  . 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 Temporary cerebral vascular dysfunction 09/18/2012  . Hypertension   . Asthma    PCP:  Eather Colas, FNP Pharmacy:   [facility] [address], [place] - [address] [place] [address] Phone: [phone] Fax: [phone]     Social Determinants of Health (SDOH) Interventions    Readmission Risk Interventions 30 Day Unplanned Readmission Risk Score     ED to Hosp-Admission (Current) from  07/12/2018 in [facility]  30 Day Unplanned Readmission Risk Score (%)  24 Filed at 07/16/2018 0801     This score is the patient's risk of an unplanned readmission within 30 days of being discharged (0 -100%). The score is based on dignosis, age, lab data, medications, orders, and past utilization.   Low:  0-14.9   Medium: 15-21.9   High: 22-29.9   Extreme: 30 and above        No flowsheet data found.

## 2018-07-16 NOTE — Progress Notes (Addendum)
Patient ID: Joseph Shannon, male   DOB: 1980/09/24, 38 y.o.   MRN: [id number]  PROGRESS NOTE    Joseph Shannon  KWI:[id number] DOB: 05-23-1980 DOA: 07/12/2018 PCP: Joseph Colas, FNP   Brief Narrative:  38 year old African-American male with history of hypertension, chronic systolic heart failure with chronic nonischemic cardiomyopathy, TIA, diabetes with diabetic neuropathy, hyperlipidemia, chronic renal disease stage II-III, asthma presented with a fall from a ladder with hip pain and was found to have a fracture.  He underwent hip repair on 07/14/2018.  Hospitalist service is following the patient in consultation.  Assessment & Plan:    Right hip fracture after a fall present on admission -status post ORIF on 07/14/2018.  Management as per primary orthopedic team  Fever -Resolved.  Currently afebrile.  Monitor off antibiotics  Chronic nonischemic cardiomyopathy/ chronic systolic heart failure -Currently compensated.  Strict input output.  Daily weights.Continue Bystolic, Aldactone, hydralazine, isosorbide mononitrate and Lasix.  Lisinopril on hold.  Resistant hypertension--blood pressure stable.  Continue amlodipine, Imdur, hydralazine, Aldactone, Bystolic and Lasix.  Diabetes mellitus type 2 with neuropathy -Continue CBGs with SSI.  Currently on metformin as well.  Continue gabapentin  Asthma -Stable.   Subjective: Patient seen and examined at bedside.  He denies any overnight worsening pain, shortness of breath, fever or cough.  Objective: Vitals:   07/15/18 1441 07/15/18 2148 07/16/18 0345 07/16/18 0829  BP: 124/79 121/81 136/89 (!) 142/90  Pulse: 91 88 82 79  Resp: 18 16 16 16   Temp: 99.1 F (37.3 C) 99.3 F (37.4 C) 98.6 F (37 C) 98.8 F (37.1 C)  TempSrc: Oral Oral Oral Oral  SpO2: 94% 97% 98% 97%  Weight:      Height:        Intake/Output Summary (Last 24 hours) at 07/16/2018 1216 Last data filed at 07/16/2018 1035 Gross per 24 hour  Intake 240 ml   Output 1000 ml  Net -760 ml   Filed Weights   07/14/18 1134  Weight: 106.6 kg    Examination:  General exam: Appears calm and comfortable.  No distress Respiratory system: Bilateral decreased breath sounds at bases, some scattered crackles Cardiovascular system: S1 & S2 heard, Rate controlled Gastrointestinal system: Abdomen is nondistended, soft and nontender. Normal bowel sounds heard. Extremities: No cyanosis, clubbing; trace edema  Data Reviewed: I have personally reviewed following labs and imaging studies  CBC: Recent Labs  Lab 07/12/18 1416 07/14/18 0411 07/15/18 0530  WBC 10.0 7.8 10.7*  NEUTROABS 7.9* 5.1 8.7*  HGB 14.1 13.4 12.3*  HCT 42.9 41.0 37.7*  MCV 95.5 98.1 96.2  PLT 211 190 203   Basic Metabolic Panel: Recent Labs  Lab 07/12/18 1416 07/12/18 1644 07/14/18 0411 07/15/18 0530  NA 134* 136 137 139  K 3.5 3.8 3.7 4.4  CL 103 105 105 104  CO2 23 24 25 27   GLUCOSE 88 89 108* 153*  BUN 14 12 12 9   CREATININE 1.06 0.97 0.96 0.96  CALCIUM 8.6* 8.5* 8.2* 8.8*  PHOS  --   --  3.0 2.0*   GFR: Estimated Creatinine Clearance: 139 mL/min (by C-G formula based on SCr of 0.96 mg/dL). Liver Function Tests: Recent Labs  Lab 07/12/18 1644 07/14/18 0411 07/15/18 0530  AST 33  --   --   ALT 21  --   --   ALKPHOS 56  --   --   BILITOT 1.5*  --   --   PROT 6.6  --   --  ALBUMIN 3.9 3.1* 3.3*   No results for input(s): LIPASE, AMYLASE in the last 168 hours. No results for input(s): AMMONIA in the last 168 hours. Coagulation Profile: No results for input(s): INR, PROTIME in the last 168 hours. Cardiac Enzymes: No results for input(s): CKTOTAL, CKMB, CKMBINDEX, TROPONINI in the last 168 hours. BNP (last 3 results) No results for input(s): PROBNP in the last 8760 hours. HbA1C: Recent Labs    07/14/18 0411  HGBA1C 5.3   CBG: Recent Labs  Lab 07/15/18 1153 07/15/18 1726 07/15/18 2145 07/16/18 0741 07/16/18 1135  GLUCAP 158* 103* 174* 103*  119*   Lipid Profile: No results for input(s): CHOL, HDL, LDLCALC, TRIG, CHOLHDL, LDLDIRECT in the last 72 hours. Thyroid Function Tests: No results for input(s): TSH, T4TOTAL, FREET4, T3FREE, THYROIDAB in the last 72 hours. Anemia Panel: No results for input(s): VITAMINB12, FOLATE, FERRITIN, TIBC, IRON, RETICCTPCT in the last 72 hours. Sepsis Labs: No results for input(s): PROCALCITON, LATICACIDVEN in the last 168 hours.  Recent Results (from the past 240 hour(s))  Surgical pcr screen     Status: None   Collection Time: 07/14/18  8:41 AM  Result Value Ref Range Status   MRSA, PCR NEGATIVE NEGATIVE Final   Staphylococcus aureus NEGATIVE NEGATIVE Final    Comment: (NOTE) The Xpert SA Assay (FDA approved for NASAL specimens in patients 61 years of age and older), is one component of a comprehensive surveillance program. It is not intended to diagnose infection nor to guide or monitor treatment. Performed at [facility], [address] [place], [place] [address]          Radiology Studies: Dg Pelvis Comp Min 3v  Result Date: 07/14/2018 CLINICAL DATA:  Followup for acetabular fracture repair. EXAM: JUDET PELVIS - 3+ VIEW COMPARISON:  Operative images obtained today. Preop images from 07/13/2018. FINDINGS: Fixation plates reduce the right acetabular fractures noted on the previous day's CT. Fracture along the humeral head is again noted, not well-defined on the current images. The hip joints are normally spaced and aligned as are the SI joints and symphysis pubis. IMPRESSION: Reduced right acetabular fractures. Orthopedic hardware appears well-seated. No evidence of an operative complication. Electronically Signed   By: Joseph Shannon M.D.   On: 07/14/2018 20:18   Dg C-arm 1-60 Min  Result Date: 07/14/2018 CLINICAL DATA:  Right hip ORIF EXAM: OPERATIVE RIGHT HIP (WITH PELVIS IF PERFORMED) 10 VIEWS TECHNIQUE: Fluoroscopic spot image(s) were submitted for interpretation  post-operatively. COMPARISON:  CT right hip 07/13/2018 FINDINGS: No acute hip dislocation. Posterior right acetabular fracture transfixed with a metallic sideplate and interlocking screws. Not well visualized is a right femoral head fracture. IMPRESSION: Interval right posterior acetabular ORIF. Electronically Signed   By: Elige Ko   On: 07/14/2018 17:22   Dg Hip Operative Unilat W Or W/o Pelvis Right  Result Date: 07/14/2018 CLINICAL DATA:  Right hip ORIF EXAM: OPERATIVE RIGHT HIP (WITH PELVIS IF PERFORMED) 10 VIEWS TECHNIQUE: Fluoroscopic spot image(s) were submitted for interpretation post-operatively. COMPARISON:  CT right hip 07/13/2018 FINDINGS: No acute hip dislocation. Posterior right acetabular fracture transfixed with a metallic sideplate and interlocking screws. Not well visualized is a right femoral head fracture. IMPRESSION: Interval right posterior acetabular ORIF. Electronically Signed   By: Elige Ko   On: 07/14/2018 17:22        Scheduled Meds: . acetaminophen  650 mg Oral Q6H  . amLODipine  5 mg Oral Daily  . atorvastatin  40 mg Oral Daily  .  budesonide  0.25 mg Nebulization BID  . docusate sodium  100 mg Oral BID  . enoxaparin (LOVENOX) injection  40 mg Subcutaneous Q24H  . furosemide  40 mg Oral Daily  . gabapentin  300 mg Oral TID  . hydrALAZINE  100 mg Oral BID  . insulin aspart  0-5 Units Subcutaneous QHS  . insulin aspart  0-9 Units Subcutaneous TID WC  . isosorbide mononitrate  120 mg Oral Daily  . metFORMIN  500 mg Oral BID WC  . multivitamin with minerals  1 tablet Oral Daily  . mupirocin cream   Topical TID  . nebivolol  20 mg Oral Daily  . nicotine  21 mg Transdermal Daily  . omega-3 acid ethyl esters  1 g Oral Daily  . pantoprazole  40 mg Oral Daily  . spironolactone  50 mg Oral Daily   Continuous Infusions: . lactated ringers 10 mL/hr at 07/14/18 1129  . methocarbamol (ROBAXIN) IV       LOS: 4 days        Glade Lloyd, MD [facility] 07/16/2018, 12:16 PM

## 2018-07-16 NOTE — Progress Notes (Signed)
Physical Therapy Treatment Patient Details Name: Joseph Shannon MRN: [id number] DOB: 1980/09/16 Today's Date: 07/16/2018    History of Present Illness Joseph Shannon is an 38 y.o. male with history of hypertensive cardiomyopathy with diastolic and systolic dysfunction, diabetes, hyperlipidemia, asthma, previous TIAs who was apparently Cleaning the gutters of a second story building at home when he fell off the ladder and fell on his right side. Immediate pain R hip; now s/p Closed reduction of right femoral head fracture dislocation under anesthesia with manipulation    PT Comments    Patient seen for mobility progression. Pt is progressing very well toward PT goals and tolerated gait and stair training this session. Pt is grossly min guard for safety with mobility with RW. Pt given HEP handout. Continue to progress as tolerated.    Follow Up Recommendations  Home health PT     Equipment Recommendations  Rolling walker with 5" wheels;3in1 (PT)    Recommendations for Other Services       Precautions / Restrictions Precautions Precautions: Posterior Hip Precaution Comments: posterior hip precautions reviewed with pt and pt's mother Restrictions Weight Bearing Restrictions: Yes RLE Weight Bearing: Touchdown weight bearing    Mobility  Bed Mobility               General bed mobility comments: pt OOB in chair upon arrival   Transfers Overall transfer level: Needs assistance Equipment used: Rolling walker (2 wheeled) Transfers: Sit to/from Stand Sit to Stand: Min guard         General transfer comment: cues for safe hand placement; min guard for safety  Ambulation/Gait Ambulation/Gait assistance: Min guard Gait Distance (Feet): 100 Feet Assistive device: Rolling walker (2 wheeled) Gait Pattern/deviations: Step-to pattern     General Gait Details: cues for maintaining TDWB status and safe use of AD   Stairs Stairs: Yes Stairs assistance: Min assist;Min  guard Stair Management: No rails;Step to pattern;Backwards;With walker;Two rails;Forwards   General stair comments: single step backwards without rails and 2 steps with bilat rails; cues for sequencing and technique    Wheelchair Mobility    Modified Rankin (Stroke Patients Only)       Balance Overall balance assessment: Needs assistance Sitting-balance support: No upper extremity supported Sitting balance-Leahy Scale: Good     Standing balance support: Bilateral upper extremity supported Standing balance-Leahy Scale: Poor Standing balance comment: Pt reliant of RW.                            Cognition Arousal/Alertness: Awake/alert Behavior During Therapy: WFL for tasks assessed/performed Overall Cognitive Status: Within Functional Limits for tasks assessed                                        Exercises Total Joint Exercises Ankle Circles/Pumps: AROM;Both Quad Sets: AROM;Both Heel Slides: AAROM;Right Long Arc Quad: AROM;Right;Seated    General Comments General comments (skin integrity, edema, etc.): mother present throughout session      Pertinent Vitals/Pain Pain Assessment: Faces Faces Pain Scale: Hurts little more Pain Location: R hip Pain Descriptors / Indicators: Guarding;Sore Pain Intervention(s): Limited activity within patient's tolerance;Monitored during session;Premedicated before session;Repositioned;Patient requesting pain meds-RN notified;RN gave pain meds during session    Home Living                      Prior Function  PT Goals (current goals can now be found in the care plan section) Progress towards PT goals: Progressing toward goals    Frequency    Min 6X/week      PT Plan Current plan remains appropriate    Co-evaluation              AM-PAC PT "6 Clicks" Mobility   Outcome Measure  Help needed turning from your back to your side while in a flat bed without using  bedrails?: A Little Help needed moving from lying on your back to sitting on the side of a flat bed without using bedrails?: A Little Help needed moving to and from a bed to a chair (including a wheelchair)?: A Little Help needed standing up from a chair using your arms (e.g., wheelchair or bedside chair)?: A Little Help needed to walk in hospital room?: A Little Help needed climbing 3-5 steps with a railing? : A Little 6 Click Score: 18    End of Session Equipment Utilized During Treatment: Other (comment)(pt refused use of gait belt) Activity Tolerance: Patient tolerated treatment well Patient left: in chair;with call bell/phone within reach;with family/visitor present Nurse Communication: Mobility status PT Visit Diagnosis: Unsteadiness on feet (R26.81);Other abnormalities of gait and mobility (R26.89);Pain Pain - Right/Left: Right Pain - part of body: Hip     Time: 1505-6979 PT Time Calculation (min) (ACUTE ONLY): 32 min  Charges:  $Gait Training: 8-22 mins $Therapeutic Exercise: 8-22 mins                     Erline Levine, PTA Acute Rehabilitation Services Pager: [phone] Office: [phone]     Carolynne Edouard 07/16/2018, 1:35 PM

## 2018-07-16 NOTE — Progress Notes (Signed)
Occupational Therapy Treatment Patient Details Name: Joseph Shannon MRN: [id number] DOB: [birth year] Today's Date: 07/16/2018    History of present illness Joseph Shannon is an 38 y.o. male with history of hypertensive cardiomyopathy with diastolic and systolic dysfunction, diabetes, hyperlipidemia, asthma, previous TIAs who was apparently Cleaning the gutters of a second story building at home when he fell off the ladder and fell on his right side. Immediate pain R hip; now s/p Closed reduction of right femoral head fracture dislocation under anesthesia with manipulation   OT comments  Pt with all questions answered and education provide at adequate  Level for home with mother (A).   Follow Up Recommendations  Follow surgeon's recommendation for DC plan and follow-up therapies;Supervision - Intermittent;Home health OT    Equipment Recommendations  3 in 1 bedside commode    Recommendations for Other Services      Precautions / Restrictions Precautions Precautions: Posterior Hip Precaution Comments: reviewed precautions for adls Restrictions Weight Bearing Restrictions: Yes RLE Weight Bearing: Touchdown weight bearing       Mobility Bed Mobility               General bed mobility comments: oob in chair onar rival  Transfers Overall transfer level: Needs assistance Equipment used: Rolling walker (2 wheeled) Transfers: Sit to/from Stand Sit to Stand: Min guard         General transfer comment: cues for safe hand placement; min guard for safety    Balance Overall balance assessment: Needs assistance Sitting-balance support: No upper extremity supported Sitting balance-Leahy Scale: Good     Standing balance support: Bilateral upper extremity supported Standing balance-Leahy Scale: Poor Standing balance comment: Pt reliant of RW.                           ADL either performed or assessed with clinical judgement   ADL Overall ADL's : Needs  assistance/impaired               Lower Body Bathing Details (indicate cue type and reason): educated on washing with clean wash cloth each        Lower Body Dressing Details (indicate cue type and reason): edcuated on LB dressing with reacher and where they can be located at [facility]/ community adn online. pt plans to have mother help him at this time.    Toilet Transfer Details (indicate cue type and reason): educated on use of urinal for night time use           General ADL Comments: pt educated on car transfer with questions from mother answered. mother plans to stay with patient in [place] as long as possible. pt has multiple pitt bull dogs at home and educated on safety with dogs     Vision       Perception     Praxis      Cognition Arousal/Alertness: Awake/alert Behavior During Therapy: WFL for tasks assessed/performed Overall Cognitive Status: Within Functional Limits for tasks assessed                                          Exercises Exercises: Total Joint Total Joint Exercises Ankle Circles/Pumps: AROM;Both Quad Sets: AROM;Both Heel Slides: AAROM;Right Long Arc Quad: AROM;Right;Seated   Shoulder Instructions       General Comments dressing dry and intact. pt with ice applied. pt with  2 out 3 recall of precautions    Pertinent Vitals/ Pain       Pain Assessment: Faces Faces Pain Scale: Hurts little more Pain Location: right hi Pain Descriptors / Indicators: Guarding Pain Intervention(s): Monitored during session;Premedicated before session;Repositioned  Home Living                                          Prior Functioning/Environment              Frequency  Min 2X/week        Progress Toward Goals  OT Goals(current goals can now be found in the care plan section)  Progress towards OT goals: Progressing toward goals  Acute Rehab OT Goals Patient Stated Goal: to go home OT Goal Formulation:  With patient/family Time For Goal Achievement: 07/29/18 Potential to Achieve Goals: Good ADL Goals Pt Will Perform Lower Body Bathing: with modified independence;with adaptive equipment;sitting/lateral leans Pt Will Perform Lower Body Dressing: with modified independence;with adaptive equipment;sit to/from stand Pt Will Transfer to Toilet: with modified independence;bedside commode;ambulating Pt Will Perform Tub/Shower Transfer: Tub transfer;3 in 1;rolling walker;ambulating;with modified independence  Plan Discharge plan remains appropriate    Co-evaluation                 AM-PAC OT "6 Clicks" Daily Activity     Outcome Measure   Help from another person eating meals?: None Help from another person taking care of personal grooming?: A Little Help from another person toileting, which includes using toliet, bedpan, or urinal?: A Little Help from another person bathing (including washing, rinsing, drying)?: A Little Help from another person to put on and taking off regular upper body clothing?: None Help from another person to put on and taking off regular lower body clothing?: A Little 6 Click Score: 20    End of Session    OT Visit Diagnosis: Unsteadiness on feet (R26.81);Muscle weakness (generalized) (M62.81);Pain;History of falling (Z91.81) Pain - Right/Left: Right Pain - part of body: Hip   Activity Tolerance Patient tolerated treatment well   Patient Left in chair;with call bell/phone within reach   Nurse Communication Mobility status;Precautions        Time: 1459(1459)-1520 OT Time Calculation (min): 21 min  Charges: OT General Charges $OT Visit: 1 Visit OT Treatments $Self Care/Home Management : 8-22 mins   Mateo Flow, OTR/L  Acute Rehabilitation Services Pager: [phone] Office: [phone] .    Mateo Flow 07/16/2018, 3:50 PM

## 2018-07-16 NOTE — Plan of Care (Signed)
  Problem: Activity: Goal: Ability to ambulate and perform ADLs will improve Outcome: Progressing   Problem: Clinical Measurements: Goal: Postoperative complications will be avoided or minimized Outcome: Progressing   Problem: Self-Concept: Goal: Ability to maintain and perform role responsibilities to the fullest extent possible will improve Outcome: Progressing   Problem: Pain Management: Goal: Pain level will decrease Outcome: Progressing   

## 2018-07-17 LAB — GLUCOSE, CAPILLARY
Glucose-Capillary: 105 mg/dL — ABNORMAL HIGH (ref 70–99)
Glucose-Capillary: 119 mg/dL — ABNORMAL HIGH (ref 70–99)

## 2018-07-17 MED ORDER — ENOXAPARIN SODIUM 40 MG/0.4ML ~~LOC~~ SOLN: 40.0000 mg | SUBCUTANEOUS | 0 refills | Status: DC

## 2018-07-17 MED ORDER — ACETAMINOPHEN 500 MG PO TABS: 500.0000 mg | ORAL_TABLET | Freq: Two times a day (BID) | ORAL | 1 refills | Status: DC

## 2018-07-17 MED ORDER — OXYCODONE HCL 5 MG PO TABS: 5.0000 mg | ORAL_TABLET | ORAL | 0 refills | Status: DC | PRN

## 2018-07-17 MED ORDER — METHOCARBAMOL 500 MG PO TABS: 500.0000 mg | ORAL_TABLET | Freq: Four times a day (QID) | ORAL | 0 refills | Status: DC | PRN

## 2018-07-17 NOTE — Progress Notes (Signed)
Patient ID: Joseph Shannon, male   DOB: 1980/11/12, 38 y.o.   MRN: [id number]  PROGRESS NOTE    Joseph Shannon  LFY:[id number] DOB: Apr 25, 1981 DOA: 07/12/2018 PCP: Eather Colas, FNP   Brief Narrative:  38 year old African-American male with history of hypertension, chronic systolic heart failure with chronic nonischemic cardiomyopathy, TIA, diabetes with diabetic neuropathy, hyperlipidemia, chronic renal disease stage II-III, asthma presented with a fall from a ladder with hip pain and was found to have a fracture.  He underwent hip repair on 07/14/2018.  Hospitalist service is following the patient in consultation.  Assessment & Plan:    Right hip fracture after a fall present on admission -status post ORIF on 07/14/2018.  Management as per primary orthopedic team -Probable plan for discharge today.  Fever -Resolved.  Currently afebrile.  Monitor off antibiotics  Chronic nonischemic cardiomyopathy/ chronic systolic heart failure -Currently compensated. Continue Bystolic, Aldactone, hydralazine, isosorbide mononitrate and Lasix.  Resume Lasix on discharge.  Outpatient follow-up with cardiology -Patient is medically stable for discharge.  Resistant hypertension--blood pressure stable.  Antihypertensives as above.  Outpatient follow-up.  Diabetes mellitus type 2 with neuropathy -Continue home regimen.  Outpatient follow-up  Asthma -Stable.   Subjective: Patient seen and examined at bedside.  He is sleepy, wakes up on calling his name.  Denies any worsening shortness of breath, chest pain, fevers.  Thinks that he would be okay to go home.  Objective: Vitals:   07/16/18 0829 07/16/18 1322 07/16/18 1945 07/17/18 0443  BP: (!) 142/90 115/70 (!) 152/80 131/74  Pulse: 79 82 94 78  Resp: 16 18 16 16   Temp: 98.8 F (37.1 C) 98.9 F (37.2 C) 99.1 F (37.3 C) 98.7 F (37.1 C)  TempSrc: Oral Oral Oral Oral  SpO2: 97% 95% 94% 98%  Weight:      Height:        Intake/Output  Summary (Last 24 hours) at 07/17/2018 0917 Last data filed at 07/16/2018 1700 Gross per 24 hour  Intake 720 ml  Output 900 ml  Net -180 ml   Filed Weights   07/14/18 1134  Weight: 106.6 kg    Examination:  General exam: Appears calm and comfortable.  No acute distress Respiratory system: Bilateral decreased breath sounds at bases, some scattered crackles, no wheezing Cardiovascular system: S1 & S2 heard, Rate controlled Gastrointestinal system: Abdomen is nondistended, soft and nontender. Normal bowel sounds heard. Extremities: No cyanosis; trace edema  Data Reviewed: I have personally reviewed following labs and imaging studies  CBC: Recent Labs  Lab 07/12/18 1416 07/14/18 0411 07/15/18 0530  WBC 10.0 7.8 10.7*  NEUTROABS 7.9* 5.1 8.7*  HGB 14.1 13.4 12.3*  HCT 42.9 41.0 37.7*  MCV 95.5 98.1 96.2  PLT 211 190 203   Basic Metabolic Panel: Recent Labs  Lab 07/12/18 1416 07/12/18 1644 07/14/18 0411 07/15/18 0530  NA 134* 136 137 139  K 3.5 3.8 3.7 4.4  CL 103 105 105 104  CO2 23 24 25 27   GLUCOSE 88 89 108* 153*  BUN 14 12 12 9   CREATININE 1.06 0.97 0.96 0.96  CALCIUM 8.6* 8.5* 8.2* 8.8*  PHOS  --   --  3.0 2.0*   GFR: Estimated Creatinine Clearance: 139 mL/min (by C-G formula based on SCr of 0.96 mg/dL). Liver Function Tests: Recent Labs  Lab 07/12/18 1644 07/14/18 0411 07/15/18 0530  AST 33  --   --   ALT 21  --   --   ALKPHOS 56  --   --  BILITOT 1.5*  --   --   PROT 6.6  --   --   ALBUMIN 3.9 3.1* 3.3*   No results for input(s): LIPASE, AMYLASE in the last 168 hours. No results for input(s): AMMONIA in the last 168 hours. Coagulation Profile: No results for input(s): INR, PROTIME in the last 168 hours. Cardiac Enzymes: No results for input(s): CKTOTAL, CKMB, CKMBINDEX, TROPONINI in the last 168 hours. BNP (last 3 results) No results for input(s): PROBNP in the last 8760 hours. HbA1C: No results for input(s): HGBA1C in the last 72 hours.  CBG: Recent Labs  Lab 07/16/18 0741 07/16/18 1135 07/16/18 1651 07/16/18 2125 07/17/18 0753  GLUCAP 103* 119* 93 164* 105*   Lipid Profile: No results for input(s): CHOL, HDL, LDLCALC, TRIG, CHOLHDL, LDLDIRECT in the last 72 hours. Thyroid Function Tests: No results for input(s): TSH, T4TOTAL, FREET4, T3FREE, THYROIDAB in the last 72 hours. Anemia Panel: No results for input(s): VITAMINB12, FOLATE, FERRITIN, TIBC, IRON, RETICCTPCT in the last 72 hours. Sepsis Labs: No results for input(s): PROCALCITON, LATICACIDVEN in the last 168 hours.  Recent Results (from the past 240 hour(s))  Surgical pcr screen     Status: None   Collection Time: 07/14/18  8:41 AM  Result Value Ref Range Status   MRSA, PCR NEGATIVE NEGATIVE Final   Staphylococcus aureus NEGATIVE NEGATIVE Final    Comment: (NOTE) The Xpert SA Assay (FDA approved for NASAL specimens in patients 85 years of age and older), is one component of a comprehensive surveillance program. It is not intended to diagnose infection nor to guide or monitor treatment. Performed at [facility], [address] [place], [place] [address]          Radiology Studies: No results found.      Scheduled Meds: . acetaminophen  650 mg Oral Q6H  . amLODipine  5 mg Oral Daily  . atorvastatin  40 mg Oral Daily  . budesonide  0.25 mg Nebulization BID  . docusate sodium  100 mg Oral BID  . enoxaparin (LOVENOX) injection  40 mg Subcutaneous Q24H  . furosemide  40 mg Oral Daily  . gabapentin  300 mg Oral TID  . hydrALAZINE  100 mg Oral BID  . insulin aspart  0-5 Units Subcutaneous QHS  . insulin aspart  0-9 Units Subcutaneous TID WC  . isosorbide mononitrate  120 mg Oral Daily  . metFORMIN  500 mg Oral BID WC  . multivitamin with minerals  1 tablet Oral Daily  . mupirocin cream   Topical TID  . nebivolol  20 mg Oral Daily  . nicotine  21 mg Transdermal Daily  . omega-3 acid ethyl esters  1 g Oral Daily  . pantoprazole   40 mg Oral Daily  . spironolactone  50 mg Oral Daily   Continuous Infusions: . lactated ringers 10 mL/hr at 07/14/18 1129  . methocarbamol (ROBAXIN) IV       LOS: 5 days        Glade Lloyd, MD [facility] 07/17/2018, 9:17 AM

## 2018-07-17 NOTE — Progress Notes (Signed)
Pt was ambulating with PT to the hall today, maint TDWB to RLE. Right hip dressing dry and intact. Discharge instructions given to pt, verbalized understanding. Discharged to home accompanied by his mother.

## 2018-07-17 NOTE — Discharge Instructions (Signed)
Orthopaedic Trauma Service Discharge Instructions   General Discharge Instructions  WEIGHT BEARING STATUS: Touchdown weight bearing right lower extremity  RANGE OF MOTION/ACTIVITY: Posterior hip precautions  Wound Care: Dressing can be removed on POD #3 (07/17/18). If there is no drainage from the incisions, patient can shower. Incision can be left open to air if there is no drainage or can be covered as needed. Wound care instructions listed below  DVT/PE prophylaxis: Lovenox 40 mg daily  Diet: as you were eating previously.  Can use over the counter stool softeners and bowel preparations, such as Miralax, to help with bowel movements.  Narcotics can be constipating.  Be sure to drink plenty of fluids  PAIN MEDICATION USE AND EXPECTATIONS  You have likely been given narcotic medications to help control your pain.  After a traumatic event that results in an fracture (broken bone) with or without surgery, it is ok to use narcotic pain medications to help control one's pain.  We understand that everyone responds to pain differently and each individual patient will be evaluated on a regular basis for the continued need for narcotic medications. Ideally, narcotic medication use should last no more than 6-8 weeks (coinciding with fracture healing).   As a patient it is your responsibility as well to monitor narcotic medication use and report the amount and frequency you use these medications when you come to your office visit.   We would also advise that if you are using narcotic medications, you should take a dose prior to therapy to maximize you participation.  IF YOU ARE ON NARCOTIC MEDICATIONS IT IS NOT PERMISSIBLE TO OPERATE A MOTOR VEHICLE (MOTORCYCLE/CAR/TRUCK/MOPED) OR HEAVY MACHINERY DO NOT MIX NARCOTICS WITH OTHER CNS (CENTRAL NERVOUS SYSTEM) DEPRESSANTS SUCH AS ALCOHOL   STOP SMOKING OR USING NICOTINE PRODUCTS!!!!  As discussed nicotine severely impairs your body's ability to heal  surgical and traumatic wounds but also impairs bone healing.  Wounds and bone heal by forming microscopic blood vessels (angiogenesis) and nicotine is a vasoconstrictor (essentially, shrinks blood vessels).  Therefore, if vasoconstriction occurs to these microscopic blood vessels they essentially disappear and are unable to deliver necessary nutrients to the healing tissue.  This is one modifiable factor that you can do to dramatically increase your chances of healing your injury.    (This means no smoking, no nicotine gum, patches, etc)  DO NOT USE NONSTEROIDAL ANTI-INFLAMMATORY DRUGS (NSAID'S)  Using products such as Advil (ibuprofen), Aleve (naproxen), Motrin (ibuprofen) for additional pain control during fracture healing can delay and/or prevent the healing response.  If you would like to take over the counter (OTC) medication, Tylenol (acetaminophen) is ok.  However, some narcotic medications that are given for pain control contain acetaminophen as well. Therefore, you should not exceed more than 4000 mg of tylenol in a day if you do not have liver disease.  Also note that there are may OTC medicines, such as cold medicines and allergy medicines that my contain tylenol as well.  If you have any questions about medications and/or interactions please ask your doctor/PA or your pharmacist.      ICE AND ELEVATE INJURED/OPERATIVE EXTREMITY  Using ice and elevating the injured extremity above your heart can help with swelling and pain control.  Icing in a pulsatile fashion, such as 20 minutes on and 20 minutes off, can be followed.    Do not place ice directly on skin. Make sure there is a barrier between to skin and the ice pack.  Using frozen items such as frozen peas works well as the conform nicely to the are that needs to be iced.  USE AN ACE WRAP OR TED HOSE FOR SWELLING CONTROL  In addition to icing and elevation, Ace wraps or TED hose are used to help limit and resolve swelling.  It is  recommended to use Ace wraps or TED hose until you are informed to stop.    When using Ace Wraps start the wrapping distally (farthest away from the body) and wrap proximally (closer to the body)   Example: If you had surgery on your leg or thing and you do not have a splint on, start the ace wrap at the toes and work your way up to the thigh        If you had surgery on your upper extremity and do not have a splint on, start the ace wrap at your fingers and work your way up to the upper arm     CALL THE OFFICE WITH ANY QUESTIONS OR CONCERNS: [phone]   VISIT OUR WEBSITE FOR ADDITIONAL INFORMATION: [email or web address]      Discharge Wound Care Instructions  Do NOT apply any ointments, solutions or lotions to pin sites or surgical wounds.  These prevent needed drainage and even though solutions like hydrogen peroxide kill bacteria, they also damage cells lining the pin sites that help fight infection.  Applying lotions or ointments can keep the wounds moist and can cause them to breakdown and open up as well. This can increase the risk for infection. When in doubt call the office.  Surgical incisions should be dressed daily.  If any drainage is noted, use one layer of adaptic, then gauze, Kerlix, and an ace wrap.  Once the incision is completely dry and without drainage, it may be left open to air out.  Showering may begin 36-48 hours later.  Cleaning gently with soap and water.  Traumatic wounds should be dressed daily as well.    One layer of adaptic, gauze, Kerlix, then ace wrap.  The adaptic can be discontinued once the draining has ceased    If you have a wet to dry dressing: wet the gauze with saline the squeeze as much saline out so the gauze is moist (not soaking wet), place moistened gauze over wound, then place a dry gauze over the moist one, followed by Kerlix wrap, then ace wrap.

## 2018-07-17 NOTE — Discharge Summary (Signed)
Orthopaedic Trauma Service (OTS) Discharge Summary   Patient ID: Joseph Shannon MRN: [id number] DOB/AGE: 38-Mar-1982 38 y.o.  Admit date: 07/12/2018 Discharge date: 07/17/2018  Admission Diagnoses: Right [place] IV femoral head fracture dislocation   Discharge Diagnoses:  Principal Problem:   Closed right hip fracture ([place]) Active Problems:   Hypertension   DOE (dyspnea on exertion)   DM (diabetes mellitus) (HCC)   Acute on chronic systolic heart failure (HCC)   Hypertensive heart disease   Tobacco use   Hyperlipidemia   Hip fracture (HCC)   Fracture of femoral head (HCC)   Closed posterior wall fracture of right acetabulum (HCC)   Closed dislocation of right hip ([place])   Past Medical History:  Diagnosis Date  . Asthma   . Chronic combined systolic and diastolic CHF (congestive heart failure) ([place])   . Diabetes mellitus without complication ([place])   . Hypertension   . NICM (nonischemic cardiomyopathy) ([place])   . Normal coronary arteries   . OSA (obstructive sleep apnea)   . TIA (transient ischemic attack) 2014     Procedures Performed: 1. ORIF right posterior wall acetabular fracture 2. Nonoperative treatment of right femoral head fracture 3. Removal of traction pin right femur    Discharged Condition: good/stable  Hospital Course: Patient presented to [facility] ED on 07/12/18 after falling off a roof while cleaning gutter, landing on his right side. He had immediate pain in the right leg. He was diagnosed with a right proximal femur fracture dislocation with associated posterior wall acetabulum fracture in the emergency department and orthopaedics was consulted. He was taken to the operating room by Dr. Lucia Gaskins that day for urgent closed reduction of right femoral head and placement of distal femoral traction pin with 10 lb of traction. He tolerated this well. Due to the complexity of his injury, Dr. Lucia Gaskins felt this would best be managed by an orthopaedic  traumatologist. He was taken back to the operating room on 07/14/18 by Dr. Doreatha Martin for removal of traction pin, ORIF of right posterior wall acetabular fracture, and nonoperative treatment of right femoral head fracture. He tolerated this procedure well. He was made TDWB on the RLE after surgery. Was placed on Lovenox for DVT prophylaxis. He began working with therapies starting on POD#1. On 07/17/2018, the patient was tolerating diet, working well with therapies, pain well controlled, vital signs stable, dressings clean, dry, intact and felt stable for discharge to home. Patient will follow up as below and knows to call with questions or concerns.     Consults: None  Significant Diagnostic Studies: None  Treatments: surgery: 1. ORIF right posterior wall acetabular fracture 2. Nonoperative treatment of right femoral head fracture 3. Removal of traction pin right femur   Discharge Exam: General - resting comfortably in bed, NAD Cardiac - heart regular rate and rhtyhm Respiratory - CTA anterior lung fields bilaterally RLE - dressing clean, dry, intact. Minimal pain to palpation of hip, knee, or leg. Minimal swelling or bruising noted. Full ankle ROM. Improved passive knee ROM without significant discomfort. Sensory and motor function intact. Neurovascularly intact  Disposition: Home/self-care   Allergies as of 07/17/2018      Reactions   Naproxen Sodium Nausea And Vomiting      Medication List    TAKE these medications   acetaminophen 500 MG tablet Commonly known as:  TYLENOL Take 1 tablet (500 mg total) by mouth every 12 (twelve) hours.   albuterol (2.5 MG/3ML) 0.083% nebulizer solution Commonly known as:  PROVENTIL Take 2.5 mg by nebulization every 4 (four) hours as needed for wheezing or shortness of breath.   albuterol 108 (90 Base) MCG/ACT inhaler Commonly known as:  PROVENTIL HFA;VENTOLIN HFA Inhale 2 puffs into the lungs daily as needed for wheezing or shortness of breath.    amLODipine 5 MG tablet Commonly known as:  NORVASC Take 1 tablet (5 mg total) by mouth daily.   aspirin EC 81 MG tablet Take 1 tablet (81 mg total) daily by mouth.   atorvastatin 40 MG tablet Commonly known as:  LIPITOR Take 40 mg by mouth daily.   cyclobenzaprine 5 MG tablet Commonly known as:  FLEXERIL Take 5 mg by mouth as needed for muscle spasms.   enoxaparin 40 MG/0.4ML injection Commonly known as:  LOVENOX Inject 0.4 mLs (40 mg total) into the skin daily for 30 days.   Fish Oil 1000 MG Caps Take 1,000 mg by mouth every evening.   fluticasone 110 MCG/ACT inhaler Commonly known as:  FLOVENT HFA Inhale 3 puffs into the lungs 3 (three) times daily.   furosemide 40 MG tablet Commonly known as:  LASIX Take 1 tablet (40 mg total) by mouth daily. Take 40 mg at 8 am and 40 mg at 2 pm. What changed:  when to take this   gabapentin 300 MG capsule Commonly known as:  NEURONTIN Take 300 mg by mouth 2 (two) times daily.   glucose monitoring kit monitoring kit 1 each by Does not apply route as needed for other. Dispense any model that is covered- dispense testing supplies for Q AC/ HS accuchecks- 1 month supply with one refil.   hydrALAZINE 100 MG tablet Commonly known as:  APRESOLINE Take 100 mg by mouth 2 (two) times daily.   isosorbide mononitrate 120 MG 24 hr tablet Commonly known as:  IMDUR Take 1 tablet (120 mg total) by mouth daily.   lisinopril 40 MG tablet Commonly known as:  PRINIVIL,ZESTRIL Take 1 tablet (40 mg total) by mouth daily.   metFORMIN 500 MG tablet Commonly known as:  GLUCOPHAGE Take 1 tablet (500 mg total) by mouth 2 (two) times daily with a meal.   methocarbamol 500 MG tablet Commonly known as:  ROBAXIN Take 1 tablet (500 mg total) by mouth every 6 (six) hours as needed for muscle spasms. What changed:    when to take this  reasons to take this   multivitamin tablet Take 1 tablet by mouth daily.   Nebivolol HCl 20 MG Tabs Commonly  known as:  Bystolic TAKE 1 TABLET BY MOUTH DAILY AT BEDTIME   nitroGLYCERIN 0.4 MG SL tablet Commonly known as:  NITROSTAT Place 1 tablet (0.4 mg total) under the tongue every 5 (five) minutes as needed for chest pain.   omeprazole 20 MG capsule Commonly known as:  PRILOSEC Take 1 capsule (20 mg total) by mouth daily.   oxyCODONE 5 MG immediate release tablet Commonly known as:  Oxy IR/ROXICODONE Take 1 tablet (5 mg total) by mouth every 4 (four) hours as needed for moderate pain.   spironolactone 50 MG tablet Commonly known as:  ALDACTONE Take 1 tablet (50 mg total) by mouth daily.            Durable Medical Equipment  (From admission, onward)         Start     Ordered   07/16/18 0847  For home use only DME 3 n 1  Once     07/16/18 0847   07/16/18 1410  For home use only DME Walker rolling  Once    Question:  Patient needs a walker to treat with the following condition  Answer:  Femur fracture, right ([place])   07/16/18 0847         Follow-up Information    [facility] [place] Follow up.   Specialty:  [place] Why:  A representative from [facility] will contact you to arrange start date and time for your therapy. Contact information: [place] [address] [phone]        Shona Needles, MD. Schedule an appointment as soon as possible for a visit in 2 week(s).   Specialty:  Orthopedic Surgery Why:  suture removal, repeat x-rays Contact information: [place] [address] [phone]           Discharge Instructions and Plan: Patient will be discharged to home today. He will remain touchdown weightbearing on the right lower extremity. He will continue with Lovenox 40 mg daily for 30 days for DVT prophylaxis. He has been educated on posterior hip precautions to follow. He has been provided with the appropriate DME equipment for home use at discharge. He will begin working with home health physical  therapy. He will follow up with Dr. Doreatha Martin in 2 weeks for repeat x-rays and suture removal.   Signed:  Leary Roca. Carmie Kanner ?([phone]? (phone) 07/17/2018, 8:30 AM  [facility] [place] [address] [phone] (F)

## 2018-07-17 NOTE — Progress Notes (Signed)
Physical Therapy Treatment Patient Details Name: Joseph Shannon MRN: [id number] DOB: [birth year] Today's Date: 07/17/2018    History of Present Illness Joseph Shannon is an 38 y.o. male with history of hypertensive cardiomyopathy with diastolic and systolic dysfunction, diabetes, hyperlipidemia, asthma, previous TIAs who was apparently Cleaning the gutters of a second story building at home when he fell off the ladder and fell on his right side. Immediate pain R hip; now s/p Closed reduction of right femoral head fracture dislocation under anesthesia with manipulation    PT Comments    Patient seen for mobility progression. Pt requires supervision/min guard for OOB mobility and will have assistance from mother at d/c. Current plan remains appropriate.    Follow Up Recommendations  Home health PT     Equipment Recommendations  Rolling walker with 5" wheels;3in1 (PT)(equipment delivered to room already)    Recommendations for Other Services       Precautions / Restrictions Precautions Precautions: Posterior Hip Precaution Comments: posterior hip precautions reviewed with pt; continues to need re education on precautions; pt has handout  Restrictions Weight Bearing Restrictions: Yes RLE Weight Bearing: Touchdown weight bearing    Mobility  Bed Mobility Overal bed mobility: Modified Independent                Transfers Overall transfer level: Needs assistance Equipment used: Rolling walker (2 wheeled) Transfers: Sit to/from Stand Sit to Stand: Supervision         General transfer comment: cues for safety; pt standing without RW nearby; cues for weight bearing status  Ambulation/Gait Ambulation/Gait assistance: Min guard Gait Distance (Feet): 120 Feet Assistive device: Rolling walker (2 wheeled) Gait Pattern/deviations: Step-to pattern;Decreased stance time - right;Decreased step length - left;Decreased weight shift to right     General Gait Details: cues  for safety when turning; LOB X1 with directional change; cues to maintain TDWB at all times   Stairs         General stair comments: verbally reveiwed single step negotiation   Wheelchair Mobility    Modified Rankin (Stroke Patients Only)       Balance Overall balance assessment: Needs assistance Sitting-balance support: No upper extremity supported Sitting balance-Leahy Scale: Good     Standing balance support: Bilateral upper extremity supported Standing balance-Leahy Scale: Poor                              Cognition Arousal/Alertness: Awake/alert Behavior During Therapy: WFL for tasks assessed/performed Overall Cognitive Status: Within Functional Limits for tasks assessed                                        Exercises Total Joint Exercises Hip ABduction/ADduction: AROM;Right;10 reps;Standing Knee Flexion: AROM;Right;10 reps;Standing Marching in Standing: AROM;Right;10 reps;Standing Standing Hip Extension: AROM;Right;10 reps;Standing    General Comments        Pertinent Vitals/Pain Pain Assessment: Faces Faces Pain Scale: Hurts little more Pain Location: R hip Pain Descriptors / Indicators: Guarding;Sore Pain Intervention(s): Limited activity within patient's tolerance;Monitored during session;Premedicated before session;Repositioned    Home Living                      Prior Function            PT Goals (current goals can now be found in the care plan section) Progress towards PT  goals: Progressing toward goals    Frequency    Min 6X/week      PT Plan Current plan remains appropriate    Co-evaluation              AM-PAC PT "6 Clicks" Mobility   Outcome Measure  Help needed turning from your back to your side while in a flat bed without using bedrails?: A Little Help needed moving from lying on your back to sitting on the side of a flat bed without using bedrails?: A Little Help needed moving  to and from a bed to a chair (including a wheelchair)?: A Little Help needed standing up from a chair using your arms (e.g., wheelchair or bedside chair)?: A Little Help needed to walk in hospital room?: A Little Help needed climbing 3-5 steps with a railing? : A Little 6 Click Score: 18    End of Session Equipment Utilized During Treatment: Other (comment)(pt refuses use of gait belt) Activity Tolerance: Patient tolerated treatment well Patient left: in chair;with call bell/phone within reach Nurse Communication: Mobility status PT Visit Diagnosis: Unsteadiness on feet (R26.81);Other abnormalities of gait and mobility (R26.89);Pain Pain - Right/Left: Right Pain - part of body: Hip     Time: 6720-9470 PT Time Calculation (min) (ACUTE ONLY): 34 min  Charges:  $Gait Training: 8-22 mins $Therapeutic Exercise: 8-22 mins                     Erline Levine, PTA Acute Rehabilitation Services Pager: [phone] Office: [phone]     Carolynne Edouard 07/17/2018, 10:16 AM

## 2018-07-21 DIAGNOSIS — S72051D Unspecified fracture of head of right femur, subsequent encounter for closed fracture with routine healing: Secondary | ICD-10-CM | POA: Diagnosis not present

## 2018-07-21 DIAGNOSIS — I110 Hypertensive heart disease with heart failure: Secondary | ICD-10-CM | POA: Diagnosis not present

## 2018-07-21 DIAGNOSIS — I5042 Chronic combined systolic (congestive) and diastolic (congestive) heart failure: Secondary | ICD-10-CM | POA: Diagnosis not present

## 2018-07-21 DIAGNOSIS — G4733 Obstructive sleep apnea (adult) (pediatric): Secondary | ICD-10-CM | POA: Diagnosis not present

## 2018-07-21 DIAGNOSIS — W11XXXD Fall on and from ladder, subsequent encounter: Secondary | ICD-10-CM | POA: Diagnosis not present

## 2018-07-21 DIAGNOSIS — E119 Type 2 diabetes mellitus without complications: Secondary | ICD-10-CM | POA: Diagnosis not present

## 2018-07-21 DIAGNOSIS — S32421D Displaced fracture of posterior wall of right acetabulum, subsequent encounter for fracture with routine healing: Secondary | ICD-10-CM | POA: Diagnosis not present

## 2018-07-21 DIAGNOSIS — S73011D Posterior subluxation of right hip, subsequent encounter: Secondary | ICD-10-CM | POA: Diagnosis not present

## 2018-07-21 DIAGNOSIS — Z9181 History of falling: Secondary | ICD-10-CM | POA: Diagnosis not present

## 2018-07-21 DIAGNOSIS — J45909 Unspecified asthma, uncomplicated: Secondary | ICD-10-CM | POA: Diagnosis not present

## 2018-07-21 DIAGNOSIS — E785 Hyperlipidemia, unspecified: Secondary | ICD-10-CM | POA: Diagnosis not present

## 2018-07-22 ENCOUNTER — Telehealth: Payer: Self-pay

## 2018-07-22 DIAGNOSIS — S72059D Unspecified fracture of head of unspecified femur, subsequent encounter for closed fracture with routine healing: Secondary | ICD-10-CM | POA: Diagnosis not present

## 2018-07-22 DIAGNOSIS — S73004D Unspecified dislocation of right hip, subsequent encounter: Secondary | ICD-10-CM | POA: Diagnosis not present

## 2018-07-22 DIAGNOSIS — S32421D Displaced fracture of posterior wall of right acetabulum, subsequent encounter for fracture with routine healing: Secondary | ICD-10-CM | POA: Diagnosis not present

## 2018-07-22 NOTE — Telephone Encounter (Signed)
I did a bystolic PA through covermymeds. Key: [id number]  Following message was received: Leonette Nutting Key: [id number] - Rx #: [id number] Outcome Approved today Effective from 07/22/2018 through 07/20/2021.

## 2018-07-25 DIAGNOSIS — Z9181 History of falling: Secondary | ICD-10-CM | POA: Diagnosis not present

## 2018-07-25 DIAGNOSIS — E785 Hyperlipidemia, unspecified: Secondary | ICD-10-CM | POA: Diagnosis not present

## 2018-07-25 DIAGNOSIS — E119 Type 2 diabetes mellitus without complications: Secondary | ICD-10-CM | POA: Diagnosis not present

## 2018-07-25 DIAGNOSIS — I5042 Chronic combined systolic (congestive) and diastolic (congestive) heart failure: Secondary | ICD-10-CM | POA: Diagnosis not present

## 2018-07-25 DIAGNOSIS — G4733 Obstructive sleep apnea (adult) (pediatric): Secondary | ICD-10-CM | POA: Diagnosis not present

## 2018-07-25 DIAGNOSIS — W11XXXD Fall on and from ladder, subsequent encounter: Secondary | ICD-10-CM | POA: Diagnosis not present

## 2018-07-25 DIAGNOSIS — S32421D Displaced fracture of posterior wall of right acetabulum, subsequent encounter for fracture with routine healing: Secondary | ICD-10-CM | POA: Diagnosis not present

## 2018-07-25 DIAGNOSIS — S73011D Posterior subluxation of right hip, subsequent encounter: Secondary | ICD-10-CM | POA: Diagnosis not present

## 2018-07-25 DIAGNOSIS — J45909 Unspecified asthma, uncomplicated: Secondary | ICD-10-CM | POA: Diagnosis not present

## 2018-07-25 DIAGNOSIS — I110 Hypertensive heart disease with heart failure: Secondary | ICD-10-CM | POA: Diagnosis not present

## 2018-07-25 DIAGNOSIS — S72051D Unspecified fracture of head of right femur, subsequent encounter for closed fracture with routine healing: Secondary | ICD-10-CM | POA: Diagnosis not present

## 2018-07-26 DIAGNOSIS — E785 Hyperlipidemia, unspecified: Secondary | ICD-10-CM | POA: Diagnosis not present

## 2018-07-26 DIAGNOSIS — S32421D Displaced fracture of posterior wall of right acetabulum, subsequent encounter for fracture with routine healing: Secondary | ICD-10-CM | POA: Diagnosis not present

## 2018-07-26 DIAGNOSIS — Z9181 History of falling: Secondary | ICD-10-CM | POA: Diagnosis not present

## 2018-07-26 DIAGNOSIS — I110 Hypertensive heart disease with heart failure: Secondary | ICD-10-CM | POA: Diagnosis not present

## 2018-07-26 DIAGNOSIS — S72051D Unspecified fracture of head of right femur, subsequent encounter for closed fracture with routine healing: Secondary | ICD-10-CM | POA: Diagnosis not present

## 2018-07-26 DIAGNOSIS — E119 Type 2 diabetes mellitus without complications: Secondary | ICD-10-CM | POA: Diagnosis not present

## 2018-07-26 DIAGNOSIS — J45909 Unspecified asthma, uncomplicated: Secondary | ICD-10-CM | POA: Diagnosis not present

## 2018-07-26 DIAGNOSIS — I5042 Chronic combined systolic (congestive) and diastolic (congestive) heart failure: Secondary | ICD-10-CM | POA: Diagnosis not present

## 2018-07-26 DIAGNOSIS — W11XXXD Fall on and from ladder, subsequent encounter: Secondary | ICD-10-CM | POA: Diagnosis not present

## 2018-07-26 DIAGNOSIS — G4733 Obstructive sleep apnea (adult) (pediatric): Secondary | ICD-10-CM | POA: Diagnosis not present

## 2018-07-26 DIAGNOSIS — S73011D Posterior subluxation of right hip, subsequent encounter: Secondary | ICD-10-CM | POA: Diagnosis not present

## 2018-07-28 DIAGNOSIS — G4733 Obstructive sleep apnea (adult) (pediatric): Secondary | ICD-10-CM | POA: Diagnosis not present

## 2018-07-28 DIAGNOSIS — W11XXXD Fall on and from ladder, subsequent encounter: Secondary | ICD-10-CM | POA: Diagnosis not present

## 2018-07-28 DIAGNOSIS — I5042 Chronic combined systolic (congestive) and diastolic (congestive) heart failure: Secondary | ICD-10-CM | POA: Diagnosis not present

## 2018-07-28 DIAGNOSIS — S72051D Unspecified fracture of head of right femur, subsequent encounter for closed fracture with routine healing: Secondary | ICD-10-CM | POA: Diagnosis not present

## 2018-07-28 DIAGNOSIS — Z9181 History of falling: Secondary | ICD-10-CM | POA: Diagnosis not present

## 2018-07-28 DIAGNOSIS — E785 Hyperlipidemia, unspecified: Secondary | ICD-10-CM | POA: Diagnosis not present

## 2018-07-28 DIAGNOSIS — J45909 Unspecified asthma, uncomplicated: Secondary | ICD-10-CM | POA: Diagnosis not present

## 2018-07-28 DIAGNOSIS — E119 Type 2 diabetes mellitus without complications: Secondary | ICD-10-CM | POA: Diagnosis not present

## 2018-07-28 DIAGNOSIS — S32421D Displaced fracture of posterior wall of right acetabulum, subsequent encounter for fracture with routine healing: Secondary | ICD-10-CM | POA: Diagnosis not present

## 2018-07-28 DIAGNOSIS — I110 Hypertensive heart disease with heart failure: Secondary | ICD-10-CM | POA: Diagnosis not present

## 2018-07-28 DIAGNOSIS — S73011D Posterior subluxation of right hip, subsequent encounter: Secondary | ICD-10-CM | POA: Diagnosis not present

## 2018-07-29 DIAGNOSIS — S7291XA Unspecified fracture of right femur, initial encounter for closed fracture: Secondary | ICD-10-CM | POA: Diagnosis not present

## 2018-07-30 DIAGNOSIS — W11XXXD Fall on and from ladder, subsequent encounter: Secondary | ICD-10-CM | POA: Diagnosis not present

## 2018-07-30 DIAGNOSIS — S72051D Unspecified fracture of head of right femur, subsequent encounter for closed fracture with routine healing: Secondary | ICD-10-CM | POA: Diagnosis not present

## 2018-07-30 DIAGNOSIS — E119 Type 2 diabetes mellitus without complications: Secondary | ICD-10-CM | POA: Diagnosis not present

## 2018-07-30 DIAGNOSIS — Z9181 History of falling: Secondary | ICD-10-CM | POA: Diagnosis not present

## 2018-07-30 DIAGNOSIS — I110 Hypertensive heart disease with heart failure: Secondary | ICD-10-CM | POA: Diagnosis not present

## 2018-07-30 DIAGNOSIS — E785 Hyperlipidemia, unspecified: Secondary | ICD-10-CM | POA: Diagnosis not present

## 2018-07-30 DIAGNOSIS — S32421D Displaced fracture of posterior wall of right acetabulum, subsequent encounter for fracture with routine healing: Secondary | ICD-10-CM | POA: Diagnosis not present

## 2018-07-30 DIAGNOSIS — I5042 Chronic combined systolic (congestive) and diastolic (congestive) heart failure: Secondary | ICD-10-CM | POA: Diagnosis not present

## 2018-07-30 DIAGNOSIS — G4733 Obstructive sleep apnea (adult) (pediatric): Secondary | ICD-10-CM | POA: Diagnosis not present

## 2018-07-30 DIAGNOSIS — J45909 Unspecified asthma, uncomplicated: Secondary | ICD-10-CM | POA: Diagnosis not present

## 2018-07-30 DIAGNOSIS — S73011D Posterior subluxation of right hip, subsequent encounter: Secondary | ICD-10-CM | POA: Diagnosis not present

## 2018-07-31 DIAGNOSIS — E785 Hyperlipidemia, unspecified: Secondary | ICD-10-CM | POA: Diagnosis not present

## 2018-07-31 DIAGNOSIS — I110 Hypertensive heart disease with heart failure: Secondary | ICD-10-CM | POA: Diagnosis not present

## 2018-07-31 DIAGNOSIS — I5042 Chronic combined systolic (congestive) and diastolic (congestive) heart failure: Secondary | ICD-10-CM | POA: Diagnosis not present

## 2018-07-31 DIAGNOSIS — S72051D Unspecified fracture of head of right femur, subsequent encounter for closed fracture with routine healing: Secondary | ICD-10-CM | POA: Diagnosis not present

## 2018-07-31 DIAGNOSIS — S32421D Displaced fracture of posterior wall of right acetabulum, subsequent encounter for fracture with routine healing: Secondary | ICD-10-CM | POA: Diagnosis not present

## 2018-07-31 DIAGNOSIS — S73011D Posterior subluxation of right hip, subsequent encounter: Secondary | ICD-10-CM | POA: Diagnosis not present

## 2018-07-31 DIAGNOSIS — W11XXXD Fall on and from ladder, subsequent encounter: Secondary | ICD-10-CM | POA: Diagnosis not present

## 2018-07-31 DIAGNOSIS — Z9181 History of falling: Secondary | ICD-10-CM | POA: Diagnosis not present

## 2018-07-31 DIAGNOSIS — J45909 Unspecified asthma, uncomplicated: Secondary | ICD-10-CM | POA: Diagnosis not present

## 2018-07-31 DIAGNOSIS — E119 Type 2 diabetes mellitus without complications: Secondary | ICD-10-CM | POA: Diagnosis not present

## 2018-07-31 DIAGNOSIS — G4733 Obstructive sleep apnea (adult) (pediatric): Secondary | ICD-10-CM | POA: Diagnosis not present

## 2018-08-09 ENCOUNTER — Other Ambulatory Visit: Payer: Self-pay | Admitting: Cardiology

## 2018-08-09 DIAGNOSIS — R079 Chest pain, unspecified: Secondary | ICD-10-CM

## 2018-08-09 DIAGNOSIS — E119 Type 2 diabetes mellitus without complications: Secondary | ICD-10-CM

## 2018-08-12 DIAGNOSIS — S32421D Displaced fracture of posterior wall of right acetabulum, subsequent encounter for fracture with routine healing: Secondary | ICD-10-CM | POA: Diagnosis not present

## 2018-08-12 DIAGNOSIS — S73004D Unspecified dislocation of right hip, subsequent encounter: Secondary | ICD-10-CM | POA: Diagnosis not present

## 2018-08-12 DIAGNOSIS — S72059D Unspecified fracture of head of unspecified femur, subsequent encounter for closed fracture with routine healing: Secondary | ICD-10-CM | POA: Diagnosis not present

## 2018-08-15 DIAGNOSIS — S32421D Displaced fracture of posterior wall of right acetabulum, subsequent encounter for fracture with routine healing: Secondary | ICD-10-CM | POA: Diagnosis not present

## 2018-08-15 DIAGNOSIS — E785 Hyperlipidemia, unspecified: Secondary | ICD-10-CM | POA: Diagnosis not present

## 2018-08-15 DIAGNOSIS — J45909 Unspecified asthma, uncomplicated: Secondary | ICD-10-CM | POA: Diagnosis not present

## 2018-08-15 DIAGNOSIS — S73011D Posterior subluxation of right hip, subsequent encounter: Secondary | ICD-10-CM | POA: Diagnosis not present

## 2018-08-15 DIAGNOSIS — E119 Type 2 diabetes mellitus without complications: Secondary | ICD-10-CM | POA: Diagnosis not present

## 2018-08-15 DIAGNOSIS — G4733 Obstructive sleep apnea (adult) (pediatric): Secondary | ICD-10-CM | POA: Diagnosis not present

## 2018-08-15 DIAGNOSIS — Z9181 History of falling: Secondary | ICD-10-CM | POA: Diagnosis not present

## 2018-08-15 DIAGNOSIS — W11XXXD Fall on and from ladder, subsequent encounter: Secondary | ICD-10-CM | POA: Diagnosis not present

## 2018-08-15 DIAGNOSIS — S72051D Unspecified fracture of head of right femur, subsequent encounter for closed fracture with routine healing: Secondary | ICD-10-CM | POA: Diagnosis not present

## 2018-08-15 DIAGNOSIS — I5042 Chronic combined systolic (congestive) and diastolic (congestive) heart failure: Secondary | ICD-10-CM | POA: Diagnosis not present

## 2018-08-15 DIAGNOSIS — I110 Hypertensive heart disease with heart failure: Secondary | ICD-10-CM | POA: Diagnosis not present

## 2018-08-20 DIAGNOSIS — S72051D Unspecified fracture of head of right femur, subsequent encounter for closed fracture with routine healing: Secondary | ICD-10-CM | POA: Diagnosis not present

## 2018-08-20 DIAGNOSIS — S32421D Displaced fracture of posterior wall of right acetabulum, subsequent encounter for fracture with routine healing: Secondary | ICD-10-CM | POA: Diagnosis not present

## 2018-08-20 DIAGNOSIS — I5042 Chronic combined systolic (congestive) and diastolic (congestive) heart failure: Secondary | ICD-10-CM | POA: Diagnosis not present

## 2018-08-20 DIAGNOSIS — W11XXXD Fall on and from ladder, subsequent encounter: Secondary | ICD-10-CM | POA: Diagnosis not present

## 2018-08-20 DIAGNOSIS — J45909 Unspecified asthma, uncomplicated: Secondary | ICD-10-CM | POA: Diagnosis not present

## 2018-08-20 DIAGNOSIS — S73011D Posterior subluxation of right hip, subsequent encounter: Secondary | ICD-10-CM | POA: Diagnosis not present

## 2018-08-20 DIAGNOSIS — G4733 Obstructive sleep apnea (adult) (pediatric): Secondary | ICD-10-CM | POA: Diagnosis not present

## 2018-08-20 DIAGNOSIS — I110 Hypertensive heart disease with heart failure: Secondary | ICD-10-CM | POA: Diagnosis not present

## 2018-08-20 DIAGNOSIS — E119 Type 2 diabetes mellitus without complications: Secondary | ICD-10-CM | POA: Diagnosis not present

## 2018-08-20 DIAGNOSIS — E785 Hyperlipidemia, unspecified: Secondary | ICD-10-CM | POA: Diagnosis not present

## 2018-08-20 DIAGNOSIS — Z9181 History of falling: Secondary | ICD-10-CM | POA: Diagnosis not present

## 2018-08-22 DIAGNOSIS — E119 Type 2 diabetes mellitus without complications: Secondary | ICD-10-CM | POA: Diagnosis not present

## 2018-08-22 DIAGNOSIS — S72051D Unspecified fracture of head of right femur, subsequent encounter for closed fracture with routine healing: Secondary | ICD-10-CM | POA: Diagnosis not present

## 2018-08-22 DIAGNOSIS — I110 Hypertensive heart disease with heart failure: Secondary | ICD-10-CM | POA: Diagnosis not present

## 2018-08-22 DIAGNOSIS — E785 Hyperlipidemia, unspecified: Secondary | ICD-10-CM | POA: Diagnosis not present

## 2018-08-22 DIAGNOSIS — S73011D Posterior subluxation of right hip, subsequent encounter: Secondary | ICD-10-CM | POA: Diagnosis not present

## 2018-08-22 DIAGNOSIS — J45909 Unspecified asthma, uncomplicated: Secondary | ICD-10-CM | POA: Diagnosis not present

## 2018-08-22 DIAGNOSIS — W11XXXD Fall on and from ladder, subsequent encounter: Secondary | ICD-10-CM | POA: Diagnosis not present

## 2018-08-22 DIAGNOSIS — I5042 Chronic combined systolic (congestive) and diastolic (congestive) heart failure: Secondary | ICD-10-CM | POA: Diagnosis not present

## 2018-08-22 DIAGNOSIS — G4733 Obstructive sleep apnea (adult) (pediatric): Secondary | ICD-10-CM | POA: Diagnosis not present

## 2018-08-22 DIAGNOSIS — S32421D Displaced fracture of posterior wall of right acetabulum, subsequent encounter for fracture with routine healing: Secondary | ICD-10-CM | POA: Diagnosis not present

## 2018-08-22 DIAGNOSIS — Z9181 History of falling: Secondary | ICD-10-CM | POA: Diagnosis not present

## 2018-08-25 DIAGNOSIS — J45909 Unspecified asthma, uncomplicated: Secondary | ICD-10-CM | POA: Diagnosis not present

## 2018-08-25 DIAGNOSIS — I5042 Chronic combined systolic (congestive) and diastolic (congestive) heart failure: Secondary | ICD-10-CM | POA: Diagnosis not present

## 2018-08-25 DIAGNOSIS — E785 Hyperlipidemia, unspecified: Secondary | ICD-10-CM | POA: Diagnosis not present

## 2018-08-25 DIAGNOSIS — Z9181 History of falling: Secondary | ICD-10-CM | POA: Diagnosis not present

## 2018-08-25 DIAGNOSIS — I110 Hypertensive heart disease with heart failure: Secondary | ICD-10-CM | POA: Diagnosis not present

## 2018-08-25 DIAGNOSIS — E119 Type 2 diabetes mellitus without complications: Secondary | ICD-10-CM | POA: Diagnosis not present

## 2018-08-25 DIAGNOSIS — S72051D Unspecified fracture of head of right femur, subsequent encounter for closed fracture with routine healing: Secondary | ICD-10-CM | POA: Diagnosis not present

## 2018-08-25 DIAGNOSIS — G4733 Obstructive sleep apnea (adult) (pediatric): Secondary | ICD-10-CM | POA: Diagnosis not present

## 2018-08-25 DIAGNOSIS — S73011D Posterior subluxation of right hip, subsequent encounter: Secondary | ICD-10-CM | POA: Diagnosis not present

## 2018-08-25 DIAGNOSIS — W11XXXD Fall on and from ladder, subsequent encounter: Secondary | ICD-10-CM | POA: Diagnosis not present

## 2018-08-25 DIAGNOSIS — S32421D Displaced fracture of posterior wall of right acetabulum, subsequent encounter for fracture with routine healing: Secondary | ICD-10-CM | POA: Diagnosis not present

## 2018-09-05 DIAGNOSIS — I5042 Chronic combined systolic (congestive) and diastolic (congestive) heart failure: Secondary | ICD-10-CM | POA: Diagnosis not present

## 2018-09-05 DIAGNOSIS — S32421D Displaced fracture of posterior wall of right acetabulum, subsequent encounter for fracture with routine healing: Secondary | ICD-10-CM | POA: Diagnosis not present

## 2018-09-05 DIAGNOSIS — Z9181 History of falling: Secondary | ICD-10-CM | POA: Diagnosis not present

## 2018-09-05 DIAGNOSIS — E785 Hyperlipidemia, unspecified: Secondary | ICD-10-CM | POA: Diagnosis not present

## 2018-09-05 DIAGNOSIS — I110 Hypertensive heart disease with heart failure: Secondary | ICD-10-CM | POA: Diagnosis not present

## 2018-09-05 DIAGNOSIS — S72051D Unspecified fracture of head of right femur, subsequent encounter for closed fracture with routine healing: Secondary | ICD-10-CM | POA: Diagnosis not present

## 2018-09-05 DIAGNOSIS — S73011D Posterior subluxation of right hip, subsequent encounter: Secondary | ICD-10-CM | POA: Diagnosis not present

## 2018-09-05 DIAGNOSIS — W11XXXD Fall on and from ladder, subsequent encounter: Secondary | ICD-10-CM | POA: Diagnosis not present

## 2018-09-05 DIAGNOSIS — E119 Type 2 diabetes mellitus without complications: Secondary | ICD-10-CM | POA: Diagnosis not present

## 2018-09-05 DIAGNOSIS — J45909 Unspecified asthma, uncomplicated: Secondary | ICD-10-CM | POA: Diagnosis not present

## 2018-09-05 DIAGNOSIS — G4733 Obstructive sleep apnea (adult) (pediatric): Secondary | ICD-10-CM | POA: Diagnosis not present

## 2018-09-09 DIAGNOSIS — S73004D Unspecified dislocation of right hip, subsequent encounter: Secondary | ICD-10-CM | POA: Diagnosis not present

## 2018-09-09 DIAGNOSIS — S72059D Unspecified fracture of head of unspecified femur, subsequent encounter for closed fracture with routine healing: Secondary | ICD-10-CM | POA: Diagnosis not present

## 2018-10-06 ENCOUNTER — Other Ambulatory Visit: Payer: Self-pay | Admitting: Nurse Practitioner

## 2018-10-06 DIAGNOSIS — I10 Essential (primary) hypertension: Secondary | ICD-10-CM

## 2018-10-06 DIAGNOSIS — I5023 Acute on chronic systolic (congestive) heart failure: Secondary | ICD-10-CM

## 2018-10-06 DIAGNOSIS — R0609 Other forms of dyspnea: Secondary | ICD-10-CM

## 2018-10-06 DIAGNOSIS — I110 Hypertensive heart disease with heart failure: Secondary | ICD-10-CM

## 2018-10-06 MED ORDER — NEBIVOLOL HCL 20 MG PO TABS: ORAL_TABLET | ORAL | 1 refills | Status: DC

## 2018-10-07 DIAGNOSIS — S32421D Displaced fracture of posterior wall of right acetabulum, subsequent encounter for fracture with routine healing: Secondary | ICD-10-CM | POA: Diagnosis not present

## 2018-10-07 DIAGNOSIS — S72059D Unspecified fracture of head of unspecified femur, subsequent encounter for closed fracture with routine healing: Secondary | ICD-10-CM | POA: Diagnosis not present

## 2018-10-07 DIAGNOSIS — S73004D Unspecified dislocation of right hip, subsequent encounter: Secondary | ICD-10-CM | POA: Diagnosis not present

## 2018-10-12 ENCOUNTER — Other Ambulatory Visit: Payer: Self-pay | Admitting: Cardiology

## 2018-11-18 DIAGNOSIS — S72059D Unspecified fracture of head of unspecified femur, subsequent encounter for closed fracture with routine healing: Secondary | ICD-10-CM | POA: Diagnosis not present

## 2018-11-18 DIAGNOSIS — S32421D Displaced fracture of posterior wall of right acetabulum, subsequent encounter for fracture with routine healing: Secondary | ICD-10-CM | POA: Diagnosis not present

## 2018-11-18 DIAGNOSIS — S73004D Unspecified dislocation of right hip, subsequent encounter: Secondary | ICD-10-CM | POA: Diagnosis not present

## 2019-02-17 DIAGNOSIS — S73004D Unspecified dislocation of right hip, subsequent encounter: Secondary | ICD-10-CM | POA: Diagnosis not present

## 2019-02-17 DIAGNOSIS — M5431 Sciatica, right side: Secondary | ICD-10-CM | POA: Diagnosis not present

## 2019-02-17 DIAGNOSIS — S32421D Displaced fracture of posterior wall of right acetabulum, subsequent encounter for fracture with routine healing: Secondary | ICD-10-CM | POA: Diagnosis not present

## 2019-02-17 DIAGNOSIS — S72059D Unspecified fracture of head of unspecified femur, subsequent encounter for closed fracture with routine healing: Secondary | ICD-10-CM | POA: Diagnosis not present

## 2019-02-19 DIAGNOSIS — E1140 Type 2 diabetes mellitus with diabetic neuropathy, unspecified: Secondary | ICD-10-CM | POA: Diagnosis not present

## 2019-02-19 DIAGNOSIS — I10 Essential (primary) hypertension: Secondary | ICD-10-CM | POA: Diagnosis not present

## 2019-02-19 DIAGNOSIS — E785 Hyperlipidemia, unspecified: Secondary | ICD-10-CM | POA: Diagnosis not present

## 2019-02-19 DIAGNOSIS — D649 Anemia, unspecified: Secondary | ICD-10-CM | POA: Diagnosis not present

## 2019-02-20 DIAGNOSIS — I110 Hypertensive heart disease with heart failure: Secondary | ICD-10-CM | POA: Diagnosis not present

## 2019-02-20 DIAGNOSIS — G4730 Sleep apnea, unspecified: Secondary | ICD-10-CM | POA: Diagnosis not present

## 2019-02-20 DIAGNOSIS — I5023 Acute on chronic systolic (congestive) heart failure: Secondary | ICD-10-CM | POA: Diagnosis not present

## 2019-02-20 DIAGNOSIS — E1140 Type 2 diabetes mellitus with diabetic neuropathy, unspecified: Secondary | ICD-10-CM | POA: Diagnosis not present

## 2019-03-17 DIAGNOSIS — G4730 Sleep apnea, unspecified: Secondary | ICD-10-CM | POA: Diagnosis not present

## 2019-04-06 ENCOUNTER — Other Ambulatory Visit: Payer: Self-pay | Admitting: Student

## 2019-04-06 DIAGNOSIS — S73004D Unspecified dislocation of right hip, subsequent encounter: Secondary | ICD-10-CM

## 2019-04-06 DIAGNOSIS — S71001D Unspecified open wound, right hip, subsequent encounter: Secondary | ICD-10-CM

## 2019-04-07 ENCOUNTER — Other Ambulatory Visit: Payer: Self-pay | Admitting: Student

## 2019-04-07 DIAGNOSIS — S73004D Unspecified dislocation of right hip, subsequent encounter: Secondary | ICD-10-CM

## 2019-04-07 DIAGNOSIS — S71001D Unspecified open wound, right hip, subsequent encounter: Secondary | ICD-10-CM

## 2019-04-10 ENCOUNTER — Ambulatory Visit
Admission: RE | Admit: 2019-04-10 | Discharge: 2019-04-10 | Disposition: A | Discharge status: Home / Self Care | Payer: BC Managed Care – PPO | Source: Ambulatory Visit | Attending: Student | Admitting: Student

## 2019-04-10 DIAGNOSIS — S71001D Unspecified open wound, right hip, subsequent encounter: Secondary | ICD-10-CM

## 2019-04-10 DIAGNOSIS — S72051D Unspecified fracture of head of right femur, subsequent encounter for closed fracture with routine healing: Secondary | ICD-10-CM | POA: Diagnosis not present

## 2019-04-13 DIAGNOSIS — R195 Other fecal abnormalities: Secondary | ICD-10-CM | POA: Diagnosis not present

## 2019-04-13 DIAGNOSIS — D649 Anemia, unspecified: Secondary | ICD-10-CM | POA: Diagnosis not present

## 2019-04-13 DIAGNOSIS — Z800 Family history of malignant neoplasm of digestive organs: Secondary | ICD-10-CM | POA: Diagnosis not present

## 2019-04-17 DIAGNOSIS — G479 Sleep disorder, unspecified: Secondary | ICD-10-CM | POA: Diagnosis not present

## 2019-04-21 DIAGNOSIS — D649 Anemia, unspecified: Secondary | ICD-10-CM | POA: Diagnosis not present

## 2019-04-21 DIAGNOSIS — Z800 Family history of malignant neoplasm of digestive organs: Secondary | ICD-10-CM | POA: Diagnosis not present

## 2019-04-21 DIAGNOSIS — Z1211 Encounter for screening for malignant neoplasm of colon: Secondary | ICD-10-CM | POA: Diagnosis not present

## 2019-04-21 DIAGNOSIS — R195 Other fecal abnormalities: Secondary | ICD-10-CM | POA: Diagnosis not present

## 2019-04-27 DIAGNOSIS — G4730 Sleep apnea, unspecified: Secondary | ICD-10-CM | POA: Diagnosis not present

## 2019-04-28 ENCOUNTER — Other Ambulatory Visit: Payer: Self-pay

## 2019-04-28 MED ORDER — HYDRALAZINE HCL 100 MG PO TABS: 100.0000 mg | ORAL_TABLET | Freq: Three times a day (TID) | ORAL | 0 refills | Status: DC

## 2019-04-29 ENCOUNTER — Ambulatory Visit: Payer: BC Managed Care – PPO | Admitting: Cardiology

## 2019-04-29 DIAGNOSIS — R5383 Other fatigue: Secondary | ICD-10-CM | POA: Diagnosis not present

## 2019-04-29 DIAGNOSIS — D7589 Other specified diseases of blood and blood-forming organs: Secondary | ICD-10-CM | POA: Diagnosis not present

## 2019-05-14 ENCOUNTER — Other Ambulatory Visit: Payer: Self-pay

## 2019-05-14 ENCOUNTER — Ambulatory Visit (INDEPENDENT_AMBULATORY_CARE_PROVIDER_SITE_OTHER): Payer: Self-pay | Admitting: Physician Assistant

## 2019-05-14 ENCOUNTER — Encounter: Payer: Self-pay | Admitting: Physician Assistant

## 2019-05-14 VITALS — BP 154/88 | HR 82 | Wt 219.4 lb

## 2019-05-14 DIAGNOSIS — I5023 Acute on chronic systolic (congestive) heart failure: Secondary | ICD-10-CM

## 2019-05-14 DIAGNOSIS — I110 Hypertensive heart disease with heart failure: Secondary | ICD-10-CM

## 2019-05-14 DIAGNOSIS — R079 Chest pain, unspecified: Secondary | ICD-10-CM

## 2019-05-14 DIAGNOSIS — R0600 Dyspnea, unspecified: Secondary | ICD-10-CM

## 2019-05-14 DIAGNOSIS — E1140 Type 2 diabetes mellitus with diabetic neuropathy, unspecified: Secondary | ICD-10-CM | POA: Diagnosis not present

## 2019-05-14 DIAGNOSIS — I428 Other cardiomyopathies: Secondary | ICD-10-CM

## 2019-05-14 DIAGNOSIS — Z1159 Encounter for screening for other viral diseases: Secondary | ICD-10-CM | POA: Diagnosis not present

## 2019-05-14 DIAGNOSIS — I10 Essential (primary) hypertension: Secondary | ICD-10-CM

## 2019-05-14 DIAGNOSIS — R0609 Other forms of dyspnea: Secondary | ICD-10-CM

## 2019-05-14 DIAGNOSIS — Z79899 Other long term (current) drug therapy: Secondary | ICD-10-CM | POA: Diagnosis not present

## 2019-05-14 MED ORDER — FUROSEMIDE 40 MG PO TABS: 40.0000 mg | ORAL_TABLET | Freq: Two times a day (BID) | ORAL | 11 refills | Status: DC

## 2019-05-14 MED ORDER — AMLODIPINE BESYLATE 10 MG PO TABS: 10.0000 mg | ORAL_TABLET | Freq: Every day | ORAL | 3 refills | Status: DC

## 2019-05-14 MED ORDER — HYDRALAZINE HCL 100 MG PO TABS: 100.0000 mg | ORAL_TABLET | Freq: Two times a day (BID) | ORAL | 3 refills | Status: DC

## 2019-05-14 MED ORDER — NITROGLYCERIN 0.4 MG SL SUBL: 0.4000 mg | SUBLINGUAL_TABLET | SUBLINGUAL | 5 refills | Status: DC | PRN

## 2019-05-14 MED ORDER — ISOSORBIDE MONONITRATE ER 120 MG PO TB24: 120.0000 mg | ORAL_TABLET | Freq: Every day | ORAL | 11 refills | Status: DC

## 2019-05-14 MED ORDER — BYSTOLIC 20 MG PO TABS: ORAL_TABLET | ORAL | 3 refills | Status: DC

## 2019-05-14 MED ORDER — ATORVASTATIN CALCIUM 40 MG PO TABS: 40.0000 mg | ORAL_TABLET | Freq: Every day | ORAL | 3 refills | Status: DC

## 2019-05-14 MED ORDER — LISINOPRIL 40 MG PO TABS: 40.0000 mg | ORAL_TABLET | Freq: Every day | ORAL | 3 refills | Status: DC

## 2019-05-14 NOTE — Progress Notes (Signed)
Cardiology Office Note    Date:  05/14/2019   ID:  Joseph Shannon, DOB 12-18-1980, MRN [id number]  PCP:  Deborah Chalk, FNP  Cardiologist:  Dr. Meda Coffee   Chief Complaint: 12 Months follow up  History of Present Illness:   Joseph Shannon is a 39 y.o. male  history of NICM, chronic combined systolic and diastolic HF - prior EF of 45% - last EF normal by echo in 2018, HTN, TIA, DM, CKD, chronic chest pain and asthma seen for yearly follow up.   Hx of chronic chest pain. He has had prior normal perfusion imaging as well as normal coronary CT imaging in 2018. Echo 08/2016 showed LVEF of 55-60%, grade 1 DD.  Echo 04/2018 showed LVEF of 60-65%,  Grade 1 DD.   Admitted 06/2018 after fall and underwent ORIF of right posterior wall acetabular fracture and nonoperative treatment of right femoral head fracture.   Here today for follow up.  Here today for medication refills.  He ran out of his Imdur 2 weeks ago.  Reports systolic blood pressure in 160-170s at home.  Never near 140.  Recently seen by pulmonologist at [facility] sleep study did not showed evidence of sleep apnea.  Denies chest pain, shortness of breath, orthopnea, PND, syncope, lower extremity edema or melena.  He is a Tourist information centre manager.  Past Medical History:  Diagnosis Date  . Asthma   . Chronic combined systolic and diastolic CHF (congestive heart failure) ([place])   . Diabetes mellitus without complication ([place])   . Hypertension   . NICM (nonischemic cardiomyopathy) ([place])   . Normal coronary arteries   . OSA (obstructive sleep apnea)   . TIA (transient ischemic attack) 2014    Past Surgical History:  Procedure Laterality Date  . HIP CLOSED REDUCTION Right 07/12/2018   Procedure: CLOSED REDUCTION HIP;  Surgeon: Erle Crocker, MD;  Location: [facility];  Service: Orthopedics;  Laterality: Right;  . INSERTION OF TRACTION PIN Right 07/12/2018   Procedure: INSERTION OF TRACTION PIN;  Surgeon: Erle Crocker, MD;   Location: [facility];  Service: Orthopedics;  Laterality: Right;  . NO PAST SURGERIES    . OPEN REDUCTION INTERNAL FIXATION ACETABULUM POSTERIOR LATERAL Right 07/14/2018   Procedure: OPEN REDUCTION INTERNAL FIXATION ACETABULUM POSTERIOR LATERAL;  Surgeon: Shona Needles, MD;  Location: [facility];  Service: Orthopedics;  Laterality: Right;    Current Medications:  Prior to Admission medications   Medication Sig Start Date End Date Taking? Authorizing Provider  albuterol (PROVENTIL HFA;VENTOLIN HFA) 108 (90 BASE) MCG/ACT inhaler Inhale 2 puffs into the lungs daily as needed for wheezing or shortness of breath.    Yes [provider]  albuterol (PROVENTIL) (2.5 MG/3ML) 0.083% nebulizer solution Take 2.5 mg by nebulization every 4 (four) hours as needed for wheezing or shortness of breath.   Yes [provider]  aspirin EC 81 MG tablet Take 1 tablet (81 mg total) daily by mouth. 03/14/17  Yes Dunn, Dayna N, PA-C  atorvastatin (LIPITOR) 40 MG tablet Take 40 mg by mouth daily.   Yes [provider]  fluticasone (FLOVENT HFA) 110 MCG/ACT inhaler Inhale 3 puffs into the lungs 3 (three) times daily.   Yes [provider]  furosemide (LASIX) 40 MG tablet Take 40 mg by mouth 2 (two) times daily.   Yes [provider]  glucose monitoring kit (FREESTYLE) monitoring kit 1 each by Does not apply route as needed for other. Dispense any model that is covered-  dispense testing supplies for Q AC/ HS accuchecks- 1 month supply with one refil. 08/20/13  Yes Advani, Vernon Prey, MD  hydrALAZINE (APRESOLINE) 100 MG tablet Take 100 mg by mouth 2 (two) times daily.   Yes [provider]  isosorbide mononitrate (IMDUR) 120 MG 24 hr tablet Take 1 tablet by mouth once daily 08/11/18  Yes Dorothy Spark, MD  lisinopril (PRINIVIL,ZESTRIL) 40 MG tablet Take 1 tablet by mouth once daily 08/11/18  Yes Dorothy Spark, MD  metFORMIN (GLUCOPHAGE) 500 MG tablet TAKE 1 TABLET BY MOUTH  TWICE DAILY WITH A MEAL 08/11/18  Yes Dorothy Spark, MD  Multiple Vitamin (MULTIVITAMIN) tablet Take 1 tablet by mouth daily.   Yes [provider]  Nebivolol HCl (BYSTOLIC) 20 MG TABS TAKE 1 TABLET BY MOUTH DAILY AT BEDTIME 10/06/18  Yes Burtis Junes, NP  nitroGLYCERIN (NITROSTAT) 0.4 MG SL tablet Place 1 tablet (0.4 mg total) under the tongue every 5 (five) minutes as needed for chest pain. 02/20/13  Yes Dorothy Spark, MD  Omega-3 Fatty Acids (FISH OIL) 1000 MG CAPS Take 1,000 mg by mouth every evening.   Yes [provider]  omeprazole (PRILOSEC) 20 MG capsule Take 1 capsule (20 mg total) by mouth daily. 08/31/15  Yes Dorothy Spark, MD  amLODipine (NORVASC) 5 MG tablet Take 1 tablet (5 mg total) by mouth daily. 08/07/17 05/19/18  Dorothy Spark, MD     Allergies:   Naproxen sodium   Social History   Socioeconomic History  . Marital status: Single    Spouse name: Not on file  . Number of children: Not on file  . Years of education: Not on file  . Highest education level: Not on file  Occupational History  . Not on file  Tobacco Use  . Smoking status: Former Smoker    Packs/day: 0.25    Types: Cigarettes    Quit date: 06/18/2017    Years since quitting: 1.9  . Smokeless tobacco: Never Used  Substance and Sexual Activity  . Alcohol use: Yes    Comment: 3 beers and a shot per day- last use yesterday  . Drug use: No  . Sexual activity: Not on file  Other Topics Concern  . Not on file  Social History Narrative  . Not on file   Social Determinants of Health   Financial Resource Strain:   . Difficulty of Paying Living Expenses: Not on file  Food Insecurity:   . Worried About Charity fundraiser in the Last Year: Not on file  . Ran Out of Food in the Last Year: Not on file  Transportation Needs:   . Lack of Transportation (Medical): Not on file  . Lack of Transportation (Non-Medical): Not on file  Physical Activity:   . Days of Exercise per  Week: Not on file  . Minutes of Exercise per Session: Not on file  Stress:   . Feeling of Stress : Not on file  Social Connections:   . Frequency of Communication with Friends and Family: Not on file  . Frequency of Social Gatherings with Friends and Family: Not on file  . Attends Religious Services: Not on file  . Active Member of Clubs or Organizations: Not on file  . Attends Archivist Meetings: Not on file  . Marital Status: Not on file     Family History:  The patient's family history includes Heart disease in his father, maternal grandmother, mother, and paternal uncle;  Hypertension in his father and mother.   ROS:   Please see the history of present illness.    ROS All other systems reviewed and are negative.   PHYSICAL EXAM:   VS:  BP (!) 154/88   Pulse 82   Wt 219 lb 6.4 oz (99.5 kg)   SpO2 96%   BMI 27.42 kg/m    GEN: Well nourished, well developed, in no acute distress  HEENT: normal  Neck: no JVD, carotid bruits, or masses Cardiac:RRR; no murmurs, rubs, or gallops,no edema  Respiratory:  clear to auscultation bilaterally, normal work of breathing GI: soft, nontender, nondistended, + BS MS: no deformity or atrophy  Skin: warm and dry, no rash Neuro:  Alert and Oriented x 3, Strength and sensation are intact Psych: euthymic mood, full affect  Wt Readings from Last 3 Encounters:  05/14/19 219 lb 6.4 oz (99.5 kg)  07/14/18 235 lb (106.6 kg)  05/19/18 230 lb 12.8 oz (104.7 kg)      Studies/Labs Reviewed:   EKG:  EKG is ordered today.  The ekg ordered today demonstrates sinus rhythm at rate of 68 bpm, LVH  Recent Labs: 07/12/2018: ALT 21 07/15/2018: BUN 9; Creatinine, Ser 0.96; Hemoglobin 12.3; Platelets 203; Potassium 4.4; Sodium 139   Lipid Panel    Component Value Date/Time   CHOL 141 07/24/2016 0922   TRIG 77 07/24/2016 0922   HDL 54 07/24/2016 0922   CHOLHDL 2.6 07/24/2016 0922   CHOLHDL 3.4 08/31/2015 0920   VLDL 18 08/31/2015 0920    LDLCALC 72 07/24/2016 0922    Additional studies/ records that were reviewed today include:   Echocardiogram: 05/20/18 Left ventricle: The cavity size was normal. There was mild   concentric hypertrophy. Systolic function was normal. The   estimated ejection fraction was in the range of 60% to 65%. Wall   motion was normal; there were no regional wall motion   abnormalities. Doppler parameters are consistent with abnormal   left ventricular relaxation (grade 1 diastolic dysfunction). - Aortic valve: Transvalvular velocity was within the normal range.   There was no stenosis. There was no regurgitation. - Mitral valve: Transvalvular velocity was within the normal range.   There was no evidence for stenosis. There was no regurgitation. - Left atrium: The atrium was moderately dilated. - Right ventricle: The cavity size was normal. Wall thickness was   normal. Systolic function was normal. - Atrial septum: No defect or patent foramen ovale was identified   by color flow Doppler. - Tricuspid valve: There was mild regurgitation. - Pulmonary arteries: Systolic pressure was mildly increased. PA   peak pressure: 38 mm Hg (S).    ASSESSMENT & PLAN:    1. Hypertensive heart disease Blood pressure elevated.  Increase amlodipine to 10 mg daily.  Restart Imdur 120 mg daily.  Continue hydralazine 100 mg twice daily, lisinopril 40 mg daily and nebivolol 20 mg daily.   2.  Nonischemic cardiomyopathy -Appears euvolemic.  Medication as above.  3.  CKD -Patient reports seen by PCP this morning and had a blood work done. Medication Adjustments/Labs and Tests Ordered: Current medicines are reviewed at length with the patient today.  Concerns regarding medicines are outlined above.  Medication changes, Labs and Tests ordered today are listed in the Patient Instructions below. Patient Instructions  Medication Instructions:  Your physician has recommended you make the following change in your  medication:  INCREASE: NORVASC TO 10 MG DAILY.  *If you need a refill on your  cardiac medications before your next appointment, please call your pharmacy*  Lab Work: NONE If you have labs (blood work) drawn today and your tests are completely normal, you will receive your results only by: Marland Kitchen MyChart Message (if you have MyChart) OR . A paper copy in the mail If you have any lab test that is abnormal or we need to change your treatment, we will call you to review the results.  Testing/Procedures: NONE  Follow-Up: At [facility], you and your health needs are our priority.  As part of our continuing mission to provide you with exceptional heart care, we have created designated Provider Care Teams.  These Care Teams include your primary Cardiologist (physician) and Advanced Practice Providers (APPs -  Physician Assistants and Nurse Practitioners) who all work together to provide you with the care you need, when you need it.  Your next appointment:   2-3 month(s)  The format for your next appointment:   VIRTUAL  Provider:   DR. Thornell Sartorius, PA  05/14/2019 4:08 PM    [facility] [place], [place], [place]  [address] Phone: [phone]; Fax: [phone]

## 2019-05-14 NOTE — Patient Instructions (Signed)
Medication Instructions:  Your physician has recommended you make the following change in your medication:  INCREASE: NORVASC TO 10 MG DAILY.  *If you need a refill on your cardiac medications before your next appointment, please call your pharmacy*  Lab Work: NONE If you have labs (blood work) drawn today and your tests are completely normal, you will receive your results only by: Marland Kitchen MyChart Message (if you have MyChart) OR . A paper copy in the mail If you have any lab test that is abnormal or we need to change your treatment, we will call you to review the results.  Testing/Procedures: NONE  Follow-Up: At [facility], you and your health needs are our priority.  As part of our continuing mission to provide you with exceptional heart care, we have created designated Provider Care Teams.  These Care Teams include your primary Cardiologist (physician) and Advanced Practice Providers (APPs -  Physician Assistants and Nurse Practitioners) who all work together to provide you with the care you need, when you need it.  Your next appointment:   2-3 month(s)  The format for your next appointment:   VIRTUAL  Provider:   DR. Delton See

## 2019-08-04 ENCOUNTER — Ambulatory Visit: Payer: Self-pay | Admitting: Cardiology

## 2019-08-25 NOTE — Progress Notes (Signed)
Cardiology Office Note    Date:  08/26/2019   ID:  Joseph Shannon, DOB November 04, 1980, MRN [id number]  PCP:  Deborah Chalk, FNP  Cardiologist: Ena Dawley, MD EPS: None  Chief Complaint  Patient presents with  . Follow-up    History of Present Illness:  Joseph Shannon is a 39 y.o. male with history of nonischemic cardiomyopathy with combined systolic and diastolic CHF prior EF 35% normal on echo 2018, hypertension, DM, TIA, CKD, chronic chest pain, asthma.  Normal coronary CT imaging 2018, echo 04/2018 normal LVEF 60 to 65% grade 1 DD  Patient was seen in our office 05/14/2019 blood pressure was elevated.  Amlodipine increased to 10 mg daily and Imdur 120 mg daily was restarted.  Patient comes in for f/u. Surprised how good his BP is doing today. Has a wrist cuff at home and BP in 140-150's at home. Says his lisinopril was increased to 80 mg daily and not taking bystolic because it was going to cost over $400 until he met his deductible. Not using salt  Past Medical History:  Diagnosis Date  . Asthma   . Chronic combined systolic and diastolic CHF (congestive heart failure) ([place])   . Diabetes mellitus without complication ([place])   . Hypertension   . NICM (nonischemic cardiomyopathy) ([place])   . Normal coronary arteries   . OSA (obstructive sleep apnea)   . TIA (transient ischemic attack) 2014    Past Surgical History:  Procedure Laterality Date  . HIP CLOSED REDUCTION Right 07/12/2018   Procedure: CLOSED REDUCTION HIP;  Surgeon: Erle Crocker, MD;  Location: [place];  Service: Orthopedics;  Laterality: Right;  . INSERTION OF TRACTION PIN Right 07/12/2018   Procedure: INSERTION OF TRACTION PIN;  Surgeon: Erle Crocker, MD;  Location: [place];  Service: Orthopedics;  Laterality: Right;  . NO PAST SURGERIES    . OPEN REDUCTION INTERNAL FIXATION ACETABULUM POSTERIOR LATERAL Right 07/14/2018   Procedure: OPEN REDUCTION INTERNAL FIXATION ACETABULUM POSTERIOR LATERAL;   Surgeon: Shona Needles, MD;  Location: [place];  Service: Orthopedics;  Laterality: Right;    Current Medications: Current Meds  Medication Sig  . albuterol (PROVENTIL HFA;VENTOLIN HFA) 108 (90 BASE) MCG/ACT inhaler Inhale 2 puffs into the lungs daily as needed for wheezing or shortness of breath.   Marland Kitchen albuterol (PROVENTIL) (2.5 MG/3ML) 0.083% nebulizer solution Take 2.5 mg by nebulization every 4 (four) hours as needed for wheezing or shortness of breath.  Marland Kitchen amLODipine (NORVASC) 10 MG tablet Take 1 tablet (10 mg total) by mouth daily.  Marland Kitchen aspirin EC 81 MG tablet Take 1 tablet (81 mg total) daily by mouth.  Marland Kitchen atorvastatin (LIPITOR) 40 MG tablet Take 1 tablet (40 mg total) by mouth daily.  . fluticasone (FLOVENT HFA) 110 MCG/ACT inhaler Inhale 3 puffs into the lungs 3 (three) times daily.  . furosemide (LASIX) 40 MG tablet Take 1 tablet (40 mg total) by mouth 2 (two) times daily.  Marland Kitchen glucose monitoring kit (FREESTYLE) monitoring kit 1 each by Does not apply route as needed for other. Dispense any model that is covered- dispense testing supplies for Q AC/ HS accuchecks- 1 month supply with one refil.  . hydrALAZINE (APRESOLINE) 100 MG tablet Take 1 tablet (100 mg total) by mouth 2 (two) times daily.  . isosorbide mononitrate (IMDUR) 120 MG 24 hr tablet Take 1 tablet (120 mg total) by mouth daily.  Marland Kitchen lisinopril (ZESTRIL) 40 MG tablet Take 1 tablet (40 mg total) by mouth  daily.  . metFORMIN (GLUCOPHAGE) 500 MG tablet TAKE 1 TABLET BY MOUTH TWICE DAILY WITH A MEAL  . Multiple Vitamin (MULTIVITAMIN) tablet Take 1 tablet by mouth daily.  . Nebivolol HCl (BYSTOLIC) 20 MG TABS TAKE 1 TABLET BY MOUTH DAILY AT BEDTIME  . nitroGLYCERIN (NITROSTAT) 0.4 MG SL tablet Place 1 tablet (0.4 mg total) under the tongue every 5 (five) minutes as needed for chest pain.  . Omega-3 Fatty Acids (FISH OIL) 1000 MG CAPS Take 1,000 mg by mouth every evening.  Marland Kitchen omeprazole (PRILOSEC) 20 MG capsule Take 1 capsule (20 mg total)  by mouth daily.  . [DISCONTINUED] Nebivolol HCl (BYSTOLIC) 20 MG TABS TAKE 1 TABLET BY MOUTH DAILY AT BEDTIME     Allergies:   Naproxen sodium   Social History   Socioeconomic History  . Marital status: Single    Spouse name: Not on file  . Number of children: Not on file  . Years of education: Not on file  . Highest education level: Not on file  Occupational History  . Not on file  Tobacco Use  . Smoking status: Former Smoker    Packs/day: 0.25    Types: Cigarettes    Quit date: 06/18/2017    Years since quitting: 2.1  . Smokeless tobacco: Never Used  Substance and Sexual Activity  . Alcohol use: Yes    Comment: 3 beers and a shot per day- last use yesterday  . Drug use: No  . Sexual activity: Not on file  Other Topics Concern  . Not on file  Social History Narrative  . Not on file   Social Determinants of Health   Financial Resource Strain:   . Difficulty of Paying Living Expenses:   Food Insecurity:   . Worried About Charity fundraiser in the Last Year:   . Arboriculturist in the Last Year:   Transportation Needs:   . Film/video editor (Medical):   Marland Kitchen Lack of Transportation (Non-Medical):   Physical Activity:   . Days of Exercise per Week:   . Minutes of Exercise per Session:   Stress:   . Feeling of Stress :   Social Connections:   . Frequency of Communication with Friends and Family:   . Frequency of Social Gatherings with Friends and Family:   . Attends Religious Services:   . Active Member of Clubs or Organizations:   . Attends Archivist Meetings:   Marland Kitchen Marital Status:      Family History:  The patient's family history includes Heart disease in his father, maternal grandmother, mother, and paternal uncle; Hypertension in his father and mother.   ROS:   Please see the history of present illness.    ROS All other systems reviewed and are negative.   PHYSICAL EXAM:   VS:  BP 124/78   Pulse 100   Ht _0  (1.905 m)   Wt 221 lb 8 oz  (100.5 kg)   SpO2 95%   BMI 27.69 kg/m   Physical Exam  GEN: Well nourished, well developed, in no acute distress  Neck: no JVD, carotid bruits, or masses Cardiac:RRR; S4,no murmurs, rubs  Respiratory:  clear to auscultation bilaterally, normal work of breathing GI: soft, nontender, nondistended, + BS Ext: without cyanosis, clubbing, or edema, Good distal pulses bilaterally Neuro:  Alert and Oriented x 3 Psych: euthymic mood, full affect  Wt Readings from Last 3 Encounters:  08/26/19 221 lb 8 oz (100.5 kg)  05/14/19  219 lb 6.4 oz (99.5 kg)  07/14/18 235 lb (106.6 kg)      Studies/Labs Reviewed:   EKG:  EKG is not ordered today.    Recent Labs: No results found for requested labs within last 8760 hours.   Lipid Panel    Component Value Date/Time   CHOL 141 07/24/2016 0922   TRIG 77 07/24/2016 0922   HDL 54 07/24/2016 0922   CHOLHDL 2.6 07/24/2016 0922   CHOLHDL 3.4 08/31/2015 0920   VLDL 18 08/31/2015 0920   LDLCALC 72 07/24/2016 0922    Additional studies/ records that were reviewed today include:  Echo 1/21/2020Study Conclusions   - Left ventricle: The cavity size was normal. There was mild    concentric hypertrophy. Systolic function was normal. The    estimated ejection fraction was in the range of 60% to 65%. Wall    motion was normal; there were no regional wall motion    abnormalities. Doppler parameters are consistent with abnormal    left ventricular relaxation (grade 1 diastolic dysfunction).  - Aortic valve: Transvalvular velocity was within the normal range.    There was no stenosis. There was no regurgitation.  - Mitral valve: Transvalvular velocity was within the normal range.    There was no evidence for stenosis. There was no regurgitation.  - Left atrium: The atrium was moderately dilated.  - Right ventricle: The cavity size was normal. Wall thickness was    normal. Systolic function was normal.  - Atrial septum: No defect or patent foramen ovale  was identified    by color flow Doppler.  - Tricuspid valve: There was mild regurgitation.  - Pulmonary arteries: Systolic pressure was mildly increased. PA    peak pressure: 38 mm Hg (S).   Coronary CTA 4/2018IMPRESSION: 1. Coronary calcium score of 0. This was 0 percentile for age and sex matched control.   2. Normal coronary origin with right dominance.   3. No evidence of CAD.   4. Mildly dilated pulmonary artery suggestive of pulmonary hypertension.   Ena Dawley     Electronically Signed   By: Ena Dawley   On: 08/04/2016 14:58      ASSESSMENT:    1. NICM (nonischemic cardiomyopathy) ([place])   2. Chronic combined systolic and diastolic CHF (congestive heart failure) ([place])   3. Essential hypertension   4. History of TIA (transient ischemic attack)   5. DOE (dyspnea on exertion)   6. Acute on chronic systolic CHF (congestive heart failure) ([facility])      PLAN:  In order of problems listed above:  History of nonischemic cardiomyopathy LVEF previously 45% normalized on echo 05/20/2018.  Coronary CTA calcium score 0 no evidence of CAD on coronary CT 07/2016-no chest pain  Chronic combined systolic and diastolic CHF compensated  Essential hypertension was elevated in January amlodipine increased to 10 mg and Imdur restarted 120 mg daily. Patient tells me he was always on amlodipine 10 mg daily and he started taking lisinopril 40 mg 2 tablets daily. Our notes don't indicate this and he hasn't seen anyone else. Plan to decrease lisinopril 40 mg once daily(we called pharmacy to verify), restart bystolic per his request(I offered to switched BB to lower costing one but he declined and said he wants to stay on it).   History of TIA  CKD-recheck today    Medication Adjustments/Labs and Tests Ordered: Current medicines are reviewed at length with the patient today.  Concerns regarding medicines are outlined above.  Medication changes, Labs and Tests ordered today are  listed in the Patient Instructions below. Patient Instructions  Medication Instructions:  Your physician recommends that you continue on your current medications as directed. Please refer to the Current Medication list given to you today.   RESTART: nebivolol (bystolic) 20 mg tablet: Take 1 tablet by mouth once a day  *If you need a refill on your cardiac medications before your next appointment, please call your pharmacy*   Lab Work: TODAY: BMET  If you have labs (blood work) drawn today and your tests are completely normal, you will receive your results only by: Marland Kitchen MyChart Message (if you have MyChart) OR . A paper copy in the mail If you have any lab test that is abnormal or we need to change your treatment, we will call you to review the results.   Testing/Procedures: None ordered   Follow-Up: Follow up with Dr. Meda Coffee on 10/27/19 at 1:20 PM  Other Instructions      Signed, Ermalinda Barrios, PA-C  08/26/2019 2:32 PM    [address], [place], [place]  [address] Phone: [phone]; Fax: [phone]

## 2019-08-26 ENCOUNTER — Ambulatory Visit (INDEPENDENT_AMBULATORY_CARE_PROVIDER_SITE_OTHER): Payer: BC Managed Care – PPO | Admitting: Physician Assistant

## 2019-08-26 ENCOUNTER — Other Ambulatory Visit: Payer: Self-pay

## 2019-08-26 ENCOUNTER — Encounter: Payer: Self-pay | Admitting: Physician Assistant

## 2019-08-26 VITALS — BP 124/78 | HR 100 | Ht 75.0 in | Wt 221.5 lb

## 2019-08-26 DIAGNOSIS — I5023 Acute on chronic systolic (congestive) heart failure: Secondary | ICD-10-CM

## 2019-08-26 DIAGNOSIS — R0600 Dyspnea, unspecified: Secondary | ICD-10-CM

## 2019-08-26 DIAGNOSIS — I5042 Chronic combined systolic (congestive) and diastolic (congestive) heart failure: Secondary | ICD-10-CM | POA: Diagnosis not present

## 2019-08-26 DIAGNOSIS — R0609 Other forms of dyspnea: Secondary | ICD-10-CM

## 2019-08-26 DIAGNOSIS — Z8673 Personal history of transient ischemic attack (TIA), and cerebral infarction without residual deficits: Secondary | ICD-10-CM | POA: Diagnosis not present

## 2019-08-26 DIAGNOSIS — I10 Essential (primary) hypertension: Secondary | ICD-10-CM

## 2019-08-26 DIAGNOSIS — I428 Other cardiomyopathies: Secondary | ICD-10-CM | POA: Diagnosis not present

## 2019-08-26 MED ORDER — BYSTOLIC 20 MG PO TABS: ORAL_TABLET | ORAL | 3 refills | Status: DC

## 2019-08-26 NOTE — Patient Instructions (Signed)
Medication Instructions:  Your physician recommends that you continue on your current medications as directed. Please refer to the Current Medication list given to you today.   RESTART: nebivolol (bystolic) 20 mg tablet: Take 1 tablet by mouth once a day  *If you need a refill on your cardiac medications before your next appointment, please call your pharmacy*   Lab Work: TODAY: BMET  If you have labs (blood work) drawn today and your tests are completely normal, you will receive your results only by: Marland Kitchen MyChart Message (if you have MyChart) OR . A paper copy in the mail If you have any lab test that is abnormal or we need to change your treatment, we will call you to review the results.   Testing/Procedures: None ordered   Follow-Up: Follow up with Dr. Delton See on 10/27/19 at 1:20 PM  Other Instructions

## 2019-08-27 LAB — BASIC METABOLIC PANEL WITH GFR
BUN/Creatinine Ratio: 19 (ref 9–20)
BUN: 18 mg/dL (ref 6–20)
CO2: 23 mmol/L (ref 20–29)
Calcium: 9.3 mg/dL (ref 8.7–10.2)
Chloride: 105 mmol/L (ref 96–106)
Creatinine, Ser: 0.97 mg/dL (ref 0.76–1.27)
GFR calc Af Amer: 114 mL/min/1.73
GFR calc non Af Amer: 99 mL/min/1.73
Glucose: 93 mg/dL (ref 65–99)
Potassium: 4.2 mmol/L (ref 3.5–5.2)
Sodium: 141 mmol/L (ref 134–144)

## 2019-08-28 ENCOUNTER — Telehealth: Payer: Self-pay

## 2019-08-28 NOTE — Telephone Encounter (Signed)
-----   Message from Loa Socks, LPN sent at 3/46/2194  7:24 AM EDT -----  ----- Message ----- From: Lars Masson, MD Sent: 08/27/2019  10:40 AM EDT To: Loa Socks, LPN  Normal  BMP

## 2019-08-28 NOTE — Telephone Encounter (Signed)
LMTCB with questions regarding normal lab results

## 2019-09-03 ENCOUNTER — Encounter: Payer: Self-pay | Admitting: *Deleted

## 2019-09-21 ENCOUNTER — Other Ambulatory Visit: Payer: Self-pay | Admitting: Cardiology

## 2019-09-21 DIAGNOSIS — E119 Type 2 diabetes mellitus without complications: Secondary | ICD-10-CM

## 2019-09-21 NOTE — Telephone Encounter (Signed)
[facility] is requesting a refill on metformin. Would Dr. Delton See like to refill this medication? Please address

## 2019-10-27 ENCOUNTER — Ambulatory Visit: Payer: BC Managed Care – PPO | Admitting: Cardiology

## 2020-01-20 ENCOUNTER — Encounter: Payer: Self-pay | Admitting: Cardiology

## 2020-01-20 ENCOUNTER — Other Ambulatory Visit: Payer: Self-pay

## 2020-01-20 ENCOUNTER — Ambulatory Visit (INDEPENDENT_AMBULATORY_CARE_PROVIDER_SITE_OTHER): Payer: 59 | Admitting: Cardiology

## 2020-01-20 ENCOUNTER — Encounter: Payer: Self-pay | Admitting: *Deleted

## 2020-01-20 VITALS — BP 140/82 | HR 80 | Ht 74.0 in | Wt 230.2 lb

## 2020-01-20 DIAGNOSIS — I10 Essential (primary) hypertension: Secondary | ICD-10-CM | POA: Diagnosis not present

## 2020-01-20 DIAGNOSIS — E7849 Other hyperlipidemia: Secondary | ICD-10-CM

## 2020-01-20 DIAGNOSIS — I5042 Chronic combined systolic (congestive) and diastolic (congestive) heart failure: Secondary | ICD-10-CM

## 2020-01-20 DIAGNOSIS — E1169 Type 2 diabetes mellitus with other specified complication: Secondary | ICD-10-CM

## 2020-01-20 DIAGNOSIS — I428 Other cardiomyopathies: Secondary | ICD-10-CM | POA: Diagnosis not present

## 2020-01-20 DIAGNOSIS — I110 Hypertensive heart disease with heart failure: Secondary | ICD-10-CM

## 2020-01-20 MED ORDER — ATORVASTATIN CALCIUM 40 MG PO TABS
40.0000 mg | ORAL_TABLET | Freq: Every day | ORAL | 3 refills | Status: DC
Start: 2020-01-20 — End: 2020-07-19

## 2020-01-20 NOTE — Patient Instructions (Signed)
Medication Instructions:   Your physician recommends that you continue on your current medications as directed. Please refer to the Current Medication list given to you today.  *If you need a refill on your cardiac medications before your next appointment, please call your pharmacy*  Lab Work:  TODAY--CMET, CBC, TSH, PRO-BNP, LIPIDS, AND HEMOGLOBIN A1C  If you have labs (blood work) drawn today and your tests are completely normal, you will receive your results only by: Marland Kitchen MyChart Message (if you have MyChart) OR . A paper copy in the mail If you have any lab test that is abnormal or we need to change your treatment, we will call you to review the results.   Follow-Up: At [facility], you and your health needs are our priority.  As part of our continuing mission to provide you with exceptional heart care, we have created designated Provider Care Teams.  These Care Teams include your primary Cardiologist (physician) and Advanced Practice Providers (APPs -  Physician Assistants and Nurse Practitioners) who all work together to provide you with the care you need, when you need it.  We recommend signing up for the patient portal called "MyChart".  Sign up information is provided on this After Visit Summary.  MyChart is used to connect with patients for Virtual Visits (Telemedicine).  Patients are able to view lab/test results, encounter notes, upcoming appointments, etc.  Non-urgent messages can be sent to your provider as well.   To learn more about what you can do with MyChart, go to [email or web address].    Your next appointment:   6 month(s)  The format for your next appointment:   In Person  Provider:   Tobias Alexander, MD

## 2020-01-20 NOTE — Progress Notes (Signed)
Cardiology Office Note    Date:  01/20/2020   ID:  Joseph Shannon, DOB 31-Dec-1980, MRN [id number]  PCP:  Deborah Chalk, FNP  Cardiologist: Ena Dawley, MD EPS: None  Reason for visit: 1 year follow-up  History of Present Illness:  Joseph Shannon is a 39 y.o. male with history of nonischemic cardiomyopathy with combined systolic and diastolic CHF prior EF 90% normal on echo 2018, hypertension, DM, TIA, CKD, chronic chest pain, asthma.  Normal coronary CT imaging 2018, echo 04/2018 normal LVEF 60 to 65% grade 1 DD, no significant valvular abnormality.  Patient was seen in our office 05/14/2019 blood pressure was elevated.  Amlodipine increased to 10 mg daily and Imdur 120 mg daily was restarted.  The patient is coming after year, he has been doing great, he works full-time, and has no symptoms of chest pain or dyspnea on exertion, he is functional class NYHA I, he has no lower extremity edema orthopnea or proximal nocturnal dyspnea.  No palpitation dizziness or falls.  He has been compliant with his medications.  Past Medical History:  Diagnosis Date  . Asthma   . Chronic combined systolic and diastolic CHF (congestive heart failure) ([place])   . Diabetes mellitus without complication ([place])   . Hypertension   . NICM (nonischemic cardiomyopathy) ([place])   . Normal coronary arteries   . OSA (obstructive sleep apnea)   . TIA (transient ischemic attack) 2014    Past Surgical History:  Procedure Laterality Date  . HIP CLOSED REDUCTION Right 07/12/2018   Procedure: CLOSED REDUCTION HIP;  Surgeon: Erle Crocker, MD;  Location: [facility];  Service: Orthopedics;  Laterality: Right;  . INSERTION OF TRACTION PIN Right 07/12/2018   Procedure: INSERTION OF TRACTION PIN;  Surgeon: Erle Crocker, MD;  Location: [facility];  Service: Orthopedics;  Laterality: Right;  . NO PAST SURGERIES    . OPEN REDUCTION INTERNAL FIXATION ACETABULUM POSTERIOR LATERAL Right 07/14/2018   Procedure: OPEN  REDUCTION INTERNAL FIXATION ACETABULUM POSTERIOR LATERAL;  Surgeon: Shona Needles, MD;  Location: [facility];  Service: Orthopedics;  Laterality: Right;   Current Medications: Current Meds  Medication Sig  . albuterol (PROVENTIL HFA;VENTOLIN HFA) 108 (90 BASE) MCG/ACT inhaler Inhale 2 puffs into the lungs daily as needed for wheezing or shortness of breath.   Marland Kitchen albuterol (PROVENTIL) (2.5 MG/3ML) 0.083% nebulizer solution Take 2.5 mg by nebulization every 4 (four) hours as needed for wheezing or shortness of breath.  Marland Kitchen aspirin EC 81 MG tablet Take 1 tablet (81 mg total) daily by mouth.  . fluticasone (FLOVENT HFA) 110 MCG/ACT inhaler Inhale 3 puffs into the lungs 3 (three) times daily.  . furosemide (LASIX) 40 MG tablet Take 1 tablet (40 mg total) by mouth 2 (two) times daily.  Marland Kitchen glucose monitoring kit (FREESTYLE) monitoring kit 1 each by Does not apply route as needed for other. Dispense any model that is covered- dispense testing supplies for Q AC/ HS accuchecks- 1 month supply with one refil.  . hydrALAZINE (APRESOLINE) 100 MG tablet Take 1 tablet (100 mg total) by mouth 2 (two) times daily.  . isosorbide mononitrate (IMDUR) 120 MG 24 hr tablet Take 1 tablet (120 mg total) by mouth daily.  Marland Kitchen lisinopril (ZESTRIL) 40 MG tablet Take 1 tablet (40 mg total) by mouth daily.  . metFORMIN (GLUCOPHAGE) 500 MG tablet TAKE 1 TABLET BY MOUTH TWICE DAILY WITH A MEAL  . Multiple Vitamin (MULTIVITAMIN) tablet Take 1 tablet by mouth daily.  Marland Kitchen  Nebivolol HCl (BYSTOLIC) 20 MG TABS TAKE 1 TABLET BY MOUTH DAILY AT BEDTIME  . nitroGLYCERIN (NITROSTAT) 0.4 MG SL tablet Place 1 tablet (0.4 mg total) under the tongue every 5 (five) minutes as needed for chest pain.  . Omega-3 Fatty Acids (FISH OIL) 1000 MG CAPS Take 1,000 mg by mouth every evening.    Allergies:   Naproxen sodium   Social History   Socioeconomic History  . Marital status: Single    Spouse name: Not on file  . Number of children: Not on file  .  Years of education: Not on file  . Highest education level: Not on file  Occupational History  . Not on file  Tobacco Use  . Smoking status: Former Smoker    Packs/day: 0.25    Types: Cigarettes    Quit date: 06/18/2017    Years since quitting: 2.5  . Smokeless tobacco: Never Used  Vaping Use  . Vaping Use: Never used  Substance and Sexual Activity  . Alcohol use: Yes    Comment: 3 beers and a shot per day- last use yesterday  . Drug use: No  . Sexual activity: Not on file  Other Topics Concern  . Not on file  Social History Narrative  . Not on file   Social Determinants of Health   Financial Resource Strain:   . Difficulty of Paying Living Expenses: Not on file  Food Insecurity:   . Worried About Charity fundraiser in the Last Year: Not on file  . Ran Out of Food in the Last Year: Not on file  Transportation Needs:   . Lack of Transportation (Medical): Not on file  . Lack of Transportation (Non-Medical): Not on file  Physical Activity:   . Days of Exercise per Week: Not on file  . Minutes of Exercise per Session: Not on file  Stress:   . Feeling of Stress : Not on file  Social Connections:   . Frequency of Communication with Friends and Family: Not on file  . Frequency of Social Gatherings with Friends and Family: Not on file  . Attends Religious Services: Not on file  . Active Member of Clubs or Organizations: Not on file  . Attends Archivist Meetings: Not on file  . Marital Status: Not on file    Family History:  The patient's family history includes Heart disease in his father, maternal grandmother, mother, and paternal uncle; Hypertension in his father and mother.   ROS:   Please see the history of present illness.    ROS All other systems reviewed and are negative.  PHYSICAL EXAM:   VS:  BP 140/82   Pulse 80   Ht _0  (1.88 m)   Wt 230 lb 3.2 oz (104.4 kg)   SpO2 95%   BMI 29.56 kg/m   Physical Exam  GEN: Well nourished, well developed,  in no acute distress  Neck: no JVD, carotid bruits, or masses Cardiac:RRR; S4,no murmurs, rubs  Respiratory:  clear to auscultation bilaterally, normal work of breathing GI: soft, nontender, nondistended, + BS Ext: without cyanosis, clubbing, or edema, Good distal pulses bilaterally Neuro:  Alert and Oriented x 3 Psych: euthymic mood, full affect  Wt Readings from Last 3 Encounters:  01/20/20 230 lb 3.2 oz (104.4 kg)  08/26/19 221 lb 8 oz (100.5 kg)  05/14/19 219 lb 6.4 oz (99.5 kg)    Studies/Labs Reviewed:   EKG:  EKG is not ordered today.  Recent Labs: 08/26/2019: BUN 18; Creatinine, Ser 0.97; Potassium 4.2; Sodium 141   Lipid Panel    Component Value Date/Time   CHOL 141 07/24/2016 0922   TRIG 77 07/24/2016 0922   HDL 54 07/24/2016 0922   CHOLHDL 2.6 07/24/2016 0922   CHOLHDL 3.4 08/31/2015 0920   VLDL 18 08/31/2015 0920   LDLCALC 72 07/24/2016 0922   Additional studies/ records that were reviewed today include:  Echo 1/21/2020Study Conclusions   - Left ventricle: The cavity size was normal. There was mild    concentric hypertrophy. Systolic function was normal. The    estimated ejection fraction was in the range of 60% to 65%. Wall    motion was normal; there were no regional wall motion    abnormalities. Doppler parameters are consistent with abnormal    left ventricular relaxation (grade 1 diastolic dysfunction).  - Aortic valve: Transvalvular velocity was within the normal range.    There was no stenosis. There was no regurgitation.  - Mitral valve: Transvalvular velocity was within the normal range.    There was no evidence for stenosis. There was no regurgitation.  - Left atrium: The atrium was moderately dilated.  - Right ventricle: The cavity size was normal. Wall thickness was    normal. Systolic function was normal.  - Atrial septum: No defect or patent foramen ovale was identified    by color flow Doppler.  - Tricuspid valve: There was mild  regurgitation.  - Pulmonary arteries: Systolic pressure was mildly increased. PA    peak pressure: 38 mm Hg (S).   Coronary CTA 4/2018IMPRESSION: 1. Coronary calcium score of 0. This was 0 percentile for age and sex matched control.   2. Normal coronary origin with right dominance.   3. No evidence of CAD.   4. Mildly dilated pulmonary artery suggestive of pulmonary hypertension.   Ena Dawley    ASSESSMENT:    1. NICM (nonischemic cardiomyopathy) ([place])   2. Chronic combined systolic and diastolic CHF (congestive heart failure) ([place])   3. Essential hypertension   4. Hypertensive heart disease with heart failure ([place])   5. Other hyperlipidemia   6. Type 2 diabetes mellitus with other specified complication, without long-term current use of insulin (H[facility])    PLAN:  In order of problems listed above:  History of nonischemic cardiomyopathy LVEF previously 45% normalized on echo 05/20/2018.  Coronary CTA calcium score 0 no evidence of CAD on coronary CT 07/2016-no chest pain, he is NYHA class I completely asymptomatic.  Chronic combined systolic and diastolic CHF compensated on current regimen, will continue same dose of Lasix.  Essential hypertension -blood pressure borderline, he is on maximum tolerated doses of multiple medications, he is compliant with low-sodium diet, he is advised to start regular exercise.  History of TIA  Medication Adjustments/Labs and Tests Ordered: Current medicines are reviewed at length with the patient today.  Concerns regarding medicines are outlined above.  Medication changes, Labs and Tests ordered today are listed in the Patient Instructions below. Patient Instructions  Medication Instructions:   Your physician recommends that you continue on your current medications as directed. Please refer to the Current Medication list given to you today.  *If you need a refill on your cardiac medications before your next appointment, please call your  pharmacy*  Lab Work:  TODAY--CMET, CBC, TSH, PRO-BNP, LIPIDS, AND HEMOGLOBIN A1C  If you have labs (blood work) drawn today and your tests are completely normal, you will receive your results only  by: . MyChart Message (if you have MyChart) OR . A paper copy in the mail If you have any lab test that is abnormal or we need to change your treatment, we will call you to review the results.   Follow-Up: At [facility], you and your health needs are our priority.  As part of our continuing mission to provide you with exceptional heart care, we have created designated Provider Care Teams.  These Care Teams include your primary Cardiologist (physician) and Advanced Practice Providers (APPs -  Physician Assistants and Nurse Practitioners) who all work together to provide you with the care you need, when you need it.  We recommend signing up for the patient portal called "MyChart".  Sign up information is provided on this After Visit Summary.  MyChart is used to connect with patients for Virtual Visits (Telemedicine).  Patients are able to view lab/test results, encounter notes, upcoming appointments, etc.  Non-urgent messages can be sent to your provider as well.   To learn more about what you can do with MyChart, go to [email or web address].    Your next appointment:   6 month(s)  The format for your next appointment:   In Person  Provider:   Ena Dawley, MD        Signed, Ena Dawley, MD  01/20/2020 11:04 AM    [address], [place], [place]  [address] Phone: [phone]; Fax: [phone]

## 2020-01-21 LAB — CBC
Hematocrit: 42.6 % (ref 37.5–51.0)
Hemoglobin: 14.4 g/dL (ref 13.0–17.7)
MCH: 31.5 pg (ref 26.6–33.0)
MCHC: 33.8 g/dL (ref 31.5–35.7)
MCV: 93 fL (ref 79–97)
Platelets: 231 10*3/uL (ref 150–450)
RBC: 4.57 x10E6/uL (ref 4.14–5.80)
RDW: 11.4 % — ABNORMAL LOW (ref 11.6–15.4)
WBC: 4.7 10*3/uL (ref 3.4–10.8)

## 2020-01-21 LAB — COMPREHENSIVE METABOLIC PANEL
ALT: 13 IU/L (ref 0–44)
AST: 12 IU/L (ref 0–40)
Albumin/Globulin Ratio: 1.7 (ref 1.2–2.2)
Albumin: 4.6 g/dL (ref 4.0–5.0)
Alkaline Phosphatase: 77 IU/L (ref 44–121)
BUN/Creatinine Ratio: 17 (ref 9–20)
BUN: 15 mg/dL (ref 6–20)
Bilirubin Total: 0.3 mg/dL (ref 0.0–1.2)
CO2: 22 mmol/L (ref 20–29)
Calcium: 9.3 mg/dL (ref 8.7–10.2)
Chloride: 103 mmol/L (ref 96–106)
Creatinine, Ser: 0.88 mg/dL (ref 0.76–1.27)
GFR calc Af Amer: 125 mL/min/{1.73_m2} (ref >59)
GFR calc non Af Amer: 108 mL/min/{1.73_m2} (ref >59)
Globulin, Total: 2.7 g/dL (ref 1.5–4.5)
Glucose: 106 mg/dL — ABNORMAL HIGH (ref 65–99)
Potassium: 4 mmol/L (ref 3.5–5.2)
Sodium: 139 mmol/L (ref 134–144)
Total Protein: 7.3 g/dL (ref 6.0–8.5)

## 2020-01-21 LAB — HEMOGLOBIN A1C
Est. average glucose Bld gHb Est-mCnc: 114 mg/dL
Hgb A1c MFr Bld: 5.6 % (ref 4.8–5.6)

## 2020-01-21 LAB — TSH: TSH: 0.421 u[IU]/mL — ABNORMAL LOW (ref 0.450–4.500)

## 2020-01-21 LAB — LIPID PANEL
Chol/HDL Ratio: 3 ratio (ref 0.0–5.0)
Cholesterol, Total: 191 mg/dL (ref 100–199)
HDL: 64 mg/dL (ref >39)
LDL Chol Calc (NIH): 111 mg/dL — ABNORMAL HIGH (ref 0–99)
Triglycerides: 86 mg/dL (ref 0–149)
VLDL Cholesterol Cal: 16 mg/dL (ref 5–40)

## 2020-01-21 LAB — PRO B NATRIURETIC PEPTIDE: NT-Pro BNP: 44 pg/mL (ref 0–86)

## 2020-01-22 ENCOUNTER — Telehealth: Payer: Self-pay | Admitting: *Deleted

## 2020-01-22 DIAGNOSIS — R7989 Other specified abnormal findings of blood chemistry: Secondary | ICD-10-CM

## 2020-01-22 NOTE — Telephone Encounter (Signed)
-----   Message from Lars Masson, MD sent at 01/21/2020  4:30 PM EDT ----- All labs normal except for borderline TSH, can you send fT3 and fT4?

## 2020-01-22 NOTE — Telephone Encounter (Signed)
Called [facility] and added on Free T3 and Free T4 on this pts existing labs, for borderline TSH level.  Lab was able to successfully add these labs on, and will be faxing the authorization to our office, for Dr. Delton See to sign and fax back.  Will await for these results to come back, before calling the pt back with these results.

## 2020-01-28 ENCOUNTER — Encounter: Payer: Self-pay | Admitting: *Deleted

## 2020-02-04 LAB — SPECIMEN STATUS REPORT

## 2020-02-04 LAB — T4, FREE: Free T4: 1.45 ng/dL (ref 0.82–1.77)

## 2020-02-04 LAB — T3, FREE: T3, Free: 3.8 pg/mL (ref 2.0–4.4)

## 2020-02-21 ENCOUNTER — Other Ambulatory Visit: Payer: Self-pay | Admitting: Cardiology

## 2020-02-21 DIAGNOSIS — E119 Type 2 diabetes mellitus without complications: Secondary | ICD-10-CM

## 2020-02-22 NOTE — Telephone Encounter (Signed)
Pt's pharmacy is requesting a refill on metformin. Would Dr. Nelson like to refill this medication? Please address 

## 2020-02-26 ENCOUNTER — Ambulatory Visit: Payer: 59 | Attending: Internal Medicine

## 2020-02-26 DIAGNOSIS — Z23 Encounter for immunization: Secondary | ICD-10-CM

## 2020-02-26 NOTE — Progress Notes (Signed)
   Covid-19 Vaccination Clinic  Name:  Joseph Shannon    MRN: [id number] DOB: Mar 29, 1981  02/26/2020  [name] was observed post Covid-19 immunization for 15 minutes without incident. He was provided with Vaccine Information Sheet and instruction to access the V-Safe system.   [name] was instructed to call 911 with any severe reactions post vaccine: Marland Kitchen Difficulty breathing  . Swelling of face and throat  . A fast heartbeat  . A bad rash all over body  . Dizziness and weakness   Immunizations Administered    Name Date Dose VIS Date Route   JANSSEN COVID-19 VACCINE 02/26/2020  9:40 AM 0.5 mL 02/17/2020 Intramuscular   Manufacturer: [employer]   Lot: [id number]   NDC: 12248-250-03
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
## 2020-04-17 ENCOUNTER — Encounter (HOSPITAL_COMMUNITY): Payer: Self-pay | Admitting: Emergency Medicine

## 2020-04-17 ENCOUNTER — Other Ambulatory Visit: Payer: Self-pay

## 2020-04-17 ENCOUNTER — Emergency Department (HOSPITAL_COMMUNITY): Payer: 59

## 2020-04-17 ENCOUNTER — Emergency Department (HOSPITAL_COMMUNITY)
Admission: EM | Admit: 2020-04-17 | Discharge: 2020-04-17 | Disposition: A | Discharge status: Home / Self Care | Payer: 59 | Attending: Emergency Medicine | Admitting: Emergency Medicine

## 2020-04-17 DIAGNOSIS — Y92410 Unspecified street and highway as the place of occurrence of the external cause: Secondary | ICD-10-CM | POA: Insufficient documentation

## 2020-04-17 DIAGNOSIS — M791 Myalgia, unspecified site: Secondary | ICD-10-CM | POA: Insufficient documentation

## 2020-04-17 DIAGNOSIS — Z7984 Long term (current) use of oral hypoglycemic drugs: Secondary | ICD-10-CM | POA: Diagnosis not present

## 2020-04-17 DIAGNOSIS — J4521 Mild intermittent asthma with (acute) exacerbation: Secondary | ICD-10-CM | POA: Diagnosis not present

## 2020-04-17 DIAGNOSIS — Z87891 Personal history of nicotine dependence: Secondary | ICD-10-CM | POA: Insufficient documentation

## 2020-04-17 DIAGNOSIS — M7918 Myalgia, other site: Secondary | ICD-10-CM

## 2020-04-17 DIAGNOSIS — M25551 Pain in right hip: Secondary | ICD-10-CM | POA: Insufficient documentation

## 2020-04-17 DIAGNOSIS — Z7982 Long term (current) use of aspirin: Secondary | ICD-10-CM | POA: Insufficient documentation

## 2020-04-17 DIAGNOSIS — S5012XA Contusion of left forearm, initial encounter: Secondary | ICD-10-CM | POA: Diagnosis present

## 2020-04-17 DIAGNOSIS — Z79899 Other long term (current) drug therapy: Secondary | ICD-10-CM | POA: Insufficient documentation

## 2020-04-17 DIAGNOSIS — I110 Hypertensive heart disease with heart failure: Secondary | ICD-10-CM | POA: Diagnosis not present

## 2020-04-17 DIAGNOSIS — E119 Type 2 diabetes mellitus without complications: Secondary | ICD-10-CM | POA: Diagnosis not present

## 2020-04-17 DIAGNOSIS — I5042 Chronic combined systolic (congestive) and diastolic (congestive) heart failure: Secondary | ICD-10-CM | POA: Insufficient documentation

## 2020-04-17 MED ORDER — METHOCARBAMOL 500 MG PO TABS: 1000.0000 mg | ORAL_TABLET | Freq: Four times a day (QID) | ORAL | 0 refills | Status: DC
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
## 2020-04-17 NOTE — ED Notes (Signed)
Patient verbalizes understanding of discharge instructions. Opportunity for questioning and answers were provided. Armband removed by staff, pt discharged from ED.  
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
## 2020-06-25 ENCOUNTER — Other Ambulatory Visit: Payer: Self-pay | Admitting: Cardiology

## 2020-06-25 DIAGNOSIS — E119 Type 2 diabetes mellitus without complications: Secondary | ICD-10-CM

## 2020-06-28 ENCOUNTER — Other Ambulatory Visit: Payer: Self-pay

## 2020-06-28 DIAGNOSIS — E119 Type 2 diabetes mellitus without complications: Secondary | ICD-10-CM

## 2020-06-28 NOTE — Telephone Encounter (Signed)
Pt's pharmacy is requesting a refill on metformin. Would Dr. Delton See like to refill this medication? Please address

## 2020-06-28 NOTE — Telephone Encounter (Signed)
Pt will need to get Metformin from PCP, due to Dr. Delton See leaving. Thanks!

## 2020-07-19 ENCOUNTER — Ambulatory Visit (INDEPENDENT_AMBULATORY_CARE_PROVIDER_SITE_OTHER): Payer: 59 | Admitting: Cardiology

## 2020-07-19 ENCOUNTER — Other Ambulatory Visit: Payer: Self-pay

## 2020-07-19 ENCOUNTER — Encounter: Payer: Self-pay | Admitting: Cardiology

## 2020-07-19 ENCOUNTER — Encounter: Payer: Self-pay | Admitting: *Deleted

## 2020-07-19 VITALS — BP 148/82 | HR 99 | Ht 74.5 in | Wt 234.2 lb

## 2020-07-19 DIAGNOSIS — I10 Essential (primary) hypertension: Secondary | ICD-10-CM | POA: Diagnosis not present

## 2020-07-19 DIAGNOSIS — I5023 Acute on chronic systolic (congestive) heart failure: Secondary | ICD-10-CM

## 2020-07-19 DIAGNOSIS — R0600 Dyspnea, unspecified: Secondary | ICD-10-CM | POA: Diagnosis not present

## 2020-07-19 DIAGNOSIS — E7849 Other hyperlipidemia: Secondary | ICD-10-CM | POA: Diagnosis not present

## 2020-07-19 DIAGNOSIS — R0609 Other forms of dyspnea: Secondary | ICD-10-CM

## 2020-07-19 DIAGNOSIS — I428 Other cardiomyopathies: Secondary | ICD-10-CM

## 2020-07-19 MED ORDER — NEBIVOLOL HCL 20 MG PO TABS: ORAL_TABLET | ORAL | 3 refills | Status: DC

## 2020-07-19 MED ORDER — ATORVASTATIN CALCIUM 40 MG PO TABS: 40.0000 mg | ORAL_TABLET | Freq: Every day | ORAL | 3 refills | Status: DC

## 2020-07-19 NOTE — Patient Instructions (Signed)
Medication Instructions:   STOP TAKING ASPIRIN NOW   *If you need a refill on your cardiac medications before your next appointment, please call your pharmacy*    Follow-Up: At [facility], you and your health needs are our priority.  As part of our continuing mission to provide you with exceptional heart care, we have created designated Provider Care Teams.  These Care Teams include your primary Cardiologist (physician) and Advanced Practice Providers (APPs -  Physician Assistants and Nurse Practitioners) who all work together to provide you with the care you need, when you need it.  We recommend signing up for the patient portal called "MyChart".  Sign up information is provided on this After Visit Summary.  MyChart is used to connect with patients for Virtual Visits (Telemedicine).  Patients are able to view lab/test results, encounter notes, upcoming appointments, etc.  Non-urgent messages can be sent to your provider as well.   To learn more about what you can do with MyChart, go to [email or web address].    Your next appointment:   8 month(s)  The format for your next appointment:   In Person  Provider:   Laurance Flatten, MD

## 2020-07-19 NOTE — Progress Notes (Signed)
Cardiology Office Note    Date:  07/19/2020   ID:  Joseph Shannon, DOB 11/04/1980, MRN [id number]  PCP:  Joseph Chalk, FNP  Cardiologist: Joseph Dawley, MD EPS: None  Reason for visit: 6 months follow-up  History of Present Illness:  Joseph Shannon is a 40 y.o. male with history of nonischemic cardiomyopathy with combined systolic and diastolic CHF prior EF 88% normal on echo 2018, hypertension, DM, TIA, CKD, chronic chest pain, asthma.  Normal coronary CT imaging 2018, echo 04/2018 normal LVEF 60 to 65% grade 1 DD, no significant valvular abnormality.  The patient is coming after 6 months, he has been doing great he continues to work full-time without any limitations to his activities, he denies any chest pain shortness of breath no lower extremity edema orthopnea or proximal nocturnal dyspnea.  No palpitation dizziness or syncope.  His blood pressure has been elevated since he ran out of his meds couple weeks ago.  Past Medical History:  Diagnosis Date  . Asthma   . Chronic combined systolic and diastolic CHF (congestive heart failure) ([facility])   . Diabetes mellitus without complication ([place])   . Hypertension   . NICM (nonischemic cardiomyopathy) ([place])   . Normal coronary arteries   . OSA (obstructive sleep apnea)   . TIA (transient ischemic attack) 2014    Past Surgical History:  Procedure Laterality Date  . HIP CLOSED REDUCTION Right 07/12/2018   Procedure: CLOSED REDUCTION HIP;  Surgeon: Erle Crocker, MD;  Location: [facility];  Service: Orthopedics;  Laterality: Right;  . INSERTION OF TRACTION PIN Right 07/12/2018   Procedure: INSERTION OF TRACTION PIN;  Surgeon: Erle Crocker, MD;  Location: [place];  Service: Orthopedics;  Laterality: Right;  . NO PAST SURGERIES    . OPEN REDUCTION INTERNAL FIXATION ACETABULUM POSTERIOR LATERAL Right 07/14/2018   Procedure: OPEN REDUCTION INTERNAL FIXATION ACETABULUM POSTERIOR LATERAL;  Surgeon: Shona Needles, MD;   Location: [facility];  Service: Orthopedics;  Laterality: Right;   Current Medications: Current Meds  Medication Sig  . albuterol (PROVENTIL HFA;VENTOLIN HFA) 108 (90 BASE) MCG/ACT inhaler Inhale 2 puffs into the lungs daily as needed for wheezing or shortness of breath.   Marland Kitchen albuterol (PROVENTIL) (2.5 MG/3ML) 0.083% nebulizer solution Take 2.5 mg by nebulization every 4 (four) hours as needed for wheezing or shortness of breath.  Marland Kitchen amLODipine (NORVASC) 10 MG tablet Take 1 tablet by mouth daily  . fluticasone (FLOVENT HFA) 110 MCG/ACT inhaler Inhale 3 puffs into the lungs 3 (three) times daily.  . furosemide (LASIX) 40 MG tablet Take 1 tablet (40 mg total) by mouth 2 (two) times daily.  Marland Kitchen glucose monitoring kit (FREESTYLE) monitoring kit 1 each by Does not apply route as needed for other. Dispense any model that is covered- dispense testing supplies for Q AC/ HS accuchecks- 1 month supply with one refil.  . hydrALAZINE (APRESOLINE) 100 MG tablet Take 1 tablet (100 mg total) by mouth 2 (two) times daily.  . isosorbide mononitrate (IMDUR) 120 MG 24 hr tablet Take 1 tablet (120 mg total) by mouth daily.  Marland Kitchen lisinopril (ZESTRIL) 40 MG tablet Take 1 tablet by mouth daily  . metFORMIN (GLUCOPHAGE) 500 MG tablet TAKE 1 TABLET BY MOUTH TWICE DAILY WITH A MEAL  . Multiple Vitamin (MULTIVITAMIN) tablet Take 1 tablet by mouth daily.  . nitroGLYCERIN (NITROSTAT) 0.4 MG SL tablet Place 1 tablet (0.4 mg total) under the tongue every 5 (five) minutes as needed for chest pain.  Marland Kitchen  Omega-3 Fatty Acids (FISH OIL) 1000 MG CAPS Take 1,000 mg by mouth every evening.  . [DISCONTINUED] aspirin EC 81 MG tablet Take 1 tablet (81 mg total) daily by mouth.  . [DISCONTINUED] atorvastatin (LIPITOR) 40 MG tablet Take 1 tablet (40 mg total) by mouth daily.  . [DISCONTINUED] Nebivolol HCl (BYSTOLIC) 20 MG TABS TAKE 1 TABLET BY MOUTH DAILY AT BEDTIME    Allergies:   Naproxen sodium   Social History   Socioeconomic History  .  Marital status: Single    Spouse name: Not on file  . Number of children: Not on file  . Years of education: Not on file  . Highest education level: Not on file  Occupational History  . Not on file  Tobacco Use  . Smoking status: Former Smoker    Packs/day: 0.25    Types: Cigarettes    Quit date: 06/18/2017    Years since quitting: 3.0  . Smokeless tobacco: Never Used  Vaping Use  . Vaping Use: Never used  Substance and Sexual Activity  . Alcohol use: Yes    Comment: 3 beers and a shot per day- last use yesterday  . Drug use: No  . Sexual activity: Not on file  Other Topics Concern  . Not on file  Social History Narrative  . Not on file   Social Determinants of Health   Financial Resource Strain: Not on file  Food Insecurity: Not on file  Transportation Needs: Not on file  Physical Activity: Not on file  Stress: Not on file  Social Connections: Not on file    Family History:  The patient's family history includes Heart disease in his father, maternal grandmother, mother, and paternal uncle; Hypertension in his father and mother.   ROS:   Please see the history of present illness.    ROS All other systems reviewed and are negative.  PHYSICAL EXAM:   VS:  BP (!) 148/82   Pulse 99   Ht 6' 2.5" (1.892 m)   Wt 234 lb 3.2 oz (106.2 kg)   SpO2 95%   BMI 29.67 kg/m   Physical Exam  GEN: Well nourished, well developed, in no acute distress  Neck: no JVD, carotid bruits, or masses Cardiac:RRR; S4,no murmurs, rubs  Respiratory:  clear to auscultation bilaterally, normal work of breathing GI: soft, nontender, nondistended, + BS Ext: without cyanosis, clubbing, or edema, Good distal pulses bilaterally Neuro:  Alert and Oriented x 3 Psych: euthymic mood, full affect  Wt Readings from Last 3 Encounters:  07/19/20 234 lb 3.2 oz (106.2 kg)  04/17/20 232 lb (105.2 kg)  01/20/20 230 lb 3.2 oz (104.4 kg)    Studies/Labs Reviewed:   EKG:  EKG is not ordered today.     Recent Labs: 01/20/2020: ALT 13; BUN 15; Creatinine, Ser 0.88; Hemoglobin 14.4; NT-Pro BNP 44; Platelets 231; Potassium 4.0; Sodium 139; TSH 0.421   Lipid Panel    Component Value Date/Time   CHOL 191 01/20/2020 0948   TRIG 86 01/20/2020 0948   HDL 64 01/20/2020 0948   CHOLHDL 3.0 01/20/2020 0948   CHOLHDL 3.4 08/31/2015 0920   VLDL 18 08/31/2015 0920   LDLCALC 111 (H) 01/20/2020 0948   Additional studies/ records that were reviewed today include:  Echo 1/21/2020Study Conclusions   - Left ventricle: The cavity size was normal. There was mild    concentric hypertrophy. Systolic function was normal. The    estimated ejection fraction was in the range of 60% to  65%. Wall    motion was normal; there were no regional wall motion    abnormalities. Doppler parameters are consistent with abnormal    left ventricular relaxation (grade 1 diastolic dysfunction).  - Aortic valve: Transvalvular velocity was within the normal range.    There was no stenosis. There was no regurgitation.  - Mitral valve: Transvalvular velocity was within the normal range.    There was no evidence for stenosis. There was no regurgitation.  - Left atrium: The atrium was moderately dilated.  - Right ventricle: The cavity size was normal. Wall thickness was    normal. Systolic function was normal.  - Atrial septum: No defect or patent foramen ovale was identified    by color flow Doppler.  - Tricuspid valve: There was mild regurgitation.  - Pulmonary arteries: Systolic pressure was mildly increased. PA    peak pressure: 38 mm Hg (S).   Coronary CTA 4/2018IMPRESSION: 1. Coronary calcium score of 0. This was 0 percentile for age and sex matched control.   2. Normal coronary origin with right dominance.   3. No evidence of CAD.   4. Mildly dilated pulmonary artery suggestive of pulmonary hypertension.   Joseph Shannon    ASSESSMENT:    1. Essential hypertension   2. DOE (dyspnea on exertion)   3.  Acute on chronic systolic CHF (congestive heart failure) (HCC)    PLAN:  In order of problems listed above:  History of nonischemic cardiomyopathy LVEF previously 45% normalized on echo 05/20/2018.  Coronary CTA calcium score 0 no evidence of CAD on coronary CT 07/2016-no chest pain, he is NYHA class I completely asymptomatic.  His medication as he ran out, I will discontinue aspirin as he has no evidence of coronary artery disease.  Chronic combined systolic and diastolic CHF compensated on current regimen, she is euvolemic.  Essential hypertension -blood pressure is elevated as he ran out of his medications, will restart.  Hyperlipidemia -elevated lipids with LDL of 111 and triglycerides 86 HDL 64 in September started on Lipitor 40 mg daily that he is tolerating well.  History of TIA  Medication Adjustments/Labs and Tests Ordered: Current medicines are reviewed at length with the patient today.  Concerns regarding medicines are outlined above.  Medication changes, Labs and Tests ordered today are listed in the Patient Instructions below. Patient Instructions  Medication Instructions:   STOP TAKING ASPIRIN NOW   *If you need a refill on your cardiac medications before your next appointment, please call your pharmacy*    Follow-Up: At [facility], you and your health needs are our priority.  As part of our continuing mission to provide you with exceptional heart care, we have created designated Provider Care Teams.  These Care Teams include your primary Cardiologist (physician) and Advanced Practice Providers (APPs -  Physician Assistants and Nurse Practitioners) who all work together to provide you with the care you need, when you need it.  We recommend signing up for the patient portal called "MyChart".  Sign up information is provided on this After Visit Summary.  MyChart is used to connect with patients for Virtual Visits (Telemedicine).  Patients are able to view lab/test results,  encounter notes, upcoming appointments, etc.  Non-urgent messages can be sent to your provider as well.   To learn more about what you can do with MyChart, go to [email or web address].    Your next appointment:   8 month(s)  The format for your next appointment:   In Person  Provider:   Gwyndolyn Kaufman, MD       Signed, Joseph Dawley, MD  07/19/2020 4:52 PM    [address], [place], [place]  [address] Phone: [phone]; Fax: [phone]

## 2020-07-22 ENCOUNTER — Other Ambulatory Visit: Payer: Self-pay | Admitting: Cardiology

## 2020-07-22 DIAGNOSIS — R0600 Dyspnea, unspecified: Secondary | ICD-10-CM

## 2020-07-22 DIAGNOSIS — R0609 Other forms of dyspnea: Secondary | ICD-10-CM

## 2020-07-22 DIAGNOSIS — E119 Type 2 diabetes mellitus without complications: Secondary | ICD-10-CM

## 2020-07-22 DIAGNOSIS — R079 Chest pain, unspecified: Secondary | ICD-10-CM

## 2020-07-22 DIAGNOSIS — I5023 Acute on chronic systolic (congestive) heart failure: Secondary | ICD-10-CM

## 2020-07-22 DIAGNOSIS — I10 Essential (primary) hypertension: Secondary | ICD-10-CM

## 2020-07-22 MED ORDER — ISOSORBIDE MONONITRATE ER 120 MG PO TB24: 120.0000 mg | ORAL_TABLET | Freq: Every day | ORAL | 3 refills | Status: DC

## 2020-07-22 MED ORDER — FUROSEMIDE 40 MG PO TABS: 40.0000 mg | ORAL_TABLET | Freq: Two times a day (BID) | ORAL | 3 refills | Status: DC

## 2020-07-22 NOTE — Telephone Encounter (Signed)
*  STAT* If patient is at the pharmacy, call can be transferred to refill team.   1. Which medications need to be refilled? (please list name of each medication and dose if known)  metFORMIN (GLUCOPHAGE) 500 MG tablet furosemide (LASIX) 40 MG tablet Nebivolol HCl (BYSTOLIC) 20 MG TABS isosorbide mononitrate (IMDUR) 120 MG 24 hr tablet  2. Which pharmacy/location (including street and city if local pharmacy) is medication to be sent to? [facility] - [place] - [address]  3. Do they need a 30 day or 90 day supply? 30   Patient states his rx are expired.

## 2020-07-22 NOTE — Telephone Encounter (Signed)
Pt's pharmacy is requesting a refill on metformin. Would Dr. Delton See like to refill this medication? Please address

## 2020-07-22 NOTE — Telephone Encounter (Signed)
Pt's is requesting a refill on metformin. Would Dr. Delton See like to refill this medication? Please address

## 2020-07-25 NOTE — Telephone Encounter (Signed)
Pts Metformin should come from PCP.

## 2020-07-25 NOTE — Telephone Encounter (Signed)
Metformin should come from pts PCP.

## 2020-10-28 ENCOUNTER — Encounter (HOSPITAL_COMMUNITY): Payer: Self-pay | Admitting: Emergency Medicine

## 2020-10-28 ENCOUNTER — Other Ambulatory Visit: Payer: Self-pay

## 2020-10-28 ENCOUNTER — Ambulatory Visit (HOSPITAL_COMMUNITY)
Admission: EM | Admit: 2020-10-28 | Discharge: 2020-10-28 | Disposition: A | Discharge status: Home / Self Care | Payer: 59 | Attending: Medical Oncology | Admitting: Medical Oncology

## 2020-10-28 DIAGNOSIS — S61211A Laceration without foreign body of left index finger without damage to nail, initial encounter: Secondary | ICD-10-CM

## 2020-10-28 MED ORDER — DOXYCYCLINE HYCLATE 100 MG PO CAPS: 100.0000 mg | ORAL_CAPSULE | Freq: Two times a day (BID) | ORAL | 0 refills | Status: DC

## 2020-10-28 NOTE — ED Triage Notes (Signed)
Cut left pointer finger with a saw last night at 8pm

## 2020-10-28 NOTE — ED Provider Notes (Signed)
[facility]    CSN: [id number] Arrival date & time: 10/28/20  1141      History   Chief Complaint Chief Complaint  Patient presents with   Laceration    Left pointer finger    HPI Joseph Shannon is a 40 y.o. male.   HPI  Laceration: Pt presents with a laceration of his pointer finger which he reports that he sustained last night at 8 PM. He was sawing when this occurred. He is UTD on his Tdap (2017). No numbness/tingling or color change of the finger. No pain of the finger bone concerning for fracture per patient.   Past Medical History:  Diagnosis Date   Asthma    Chronic combined systolic and diastolic CHF (congestive heart failure) (HCC)    Diabetes mellitus without complication ([place])    Hypertension    NICM (nonischemic cardiomyopathy) ([place])    Normal coronary arteries    OSA (obstructive sleep apnea)    TIA (transient ischemic attack) 2014    Patient Active Problem List   Diagnosis Date Noted   Fracture of femoral head ([place]) 07/14/2018   Closed posterior wall fracture of right acetabulum ([place]) 07/14/2018   Closed dislocation of right hip ([place]) 07/14/2018   Closed right hip fracture ([place]) 07/12/2018   Hip fracture ([place]) 07/12/2018   Hyperlipidemia 07/24/2016   History of TIA (transient ischemic attack) 07/13/2016   Fever blister 06/12/2016   Gastroenteritis 06/12/2016   Mild intermittent asthma with exacerbation 06/12/2016   Tobacco use 03/26/2016   Diverticulitis of intestine without perforation or abscess without bleeding 12/20/2015   Family history of malignant neoplasm of colon in first degree relative diagnosed when younger than 40 years of age 63/22/2017   Hypertensive heart disease 08/31/2015   Sweating 08/31/2015   Sinus tarsitis of left foot 07/28/2015   Acute on chronic systolic heart failure ([place]) 09/09/2014   Sleep apnea 09/09/2014   Diabetes mellitus ([place]) 03/08/2014   DM (diabetes mellitus) ([place]) 08/20/2013   Chest pain  08/19/2013   Paresthesias 08/19/2013   DOE (dyspnea on exertion) 08/19/2013   Hypertensive urgency 09/19/2012   Pineal gland cyst 09/19/2012   Temporary cerebral vascular dysfunction 09/18/2012   Hypertension    Asthma     Past Surgical History:  Procedure Laterality Date   HIP CLOSED REDUCTION Right 07/12/2018   Procedure: CLOSED REDUCTION HIP;  Surgeon: Erle Crocker, MD;  Location: [facility];  Service: Orthopedics;  Laterality: Right;   INSERTION OF TRACTION PIN Right 07/12/2018   Procedure: INSERTION OF TRACTION PIN;  Surgeon: Erle Crocker, MD;  Location: [facility];  Service: Orthopedics;  Laterality: Right;   NO PAST SURGERIES     OPEN REDUCTION INTERNAL FIXATION ACETABULUM POSTERIOR LATERAL Right 07/14/2018   Procedure: OPEN REDUCTION INTERNAL FIXATION ACETABULUM POSTERIOR LATERAL;  Surgeon: Shona Needles, MD;  Location: [facility];  Service: Orthopedics;  Laterality: Right;       Home Medications    Prior to Admission medications   Medication Sig Start Date End Date Taking? Authorizing Provider  albuterol (PROVENTIL HFA;VENTOLIN HFA) 108 (90 BASE) MCG/ACT inhaler Inhale 2 puffs into the lungs daily as needed for wheezing or shortness of breath.     [provider]  albuterol (PROVENTIL) (2.5 MG/3ML) 0.083% nebulizer solution Take 2.5 mg by nebulization every 4 (four) hours as needed for wheezing or shortness of breath.    [provider]  amLODipine (NORVASC) 10 MG tablet Take 1 tablet by mouth daily 06/28/20  Dorothy Spark, MD  atorvastatin (LIPITOR) 40 MG tablet Take 1 tablet (40 mg total) by mouth daily. 07/19/20   Dorothy Spark, MD  fluticasone (FLOVENT HFA) 110 MCG/ACT inhaler Inhale 3 puffs into the lungs 3 (three) times daily.    [provider]  furosemide (LASIX) 40 MG tablet Take 1 tablet (40 mg total) by mouth 2 (two) times daily. 07/22/20   Dorothy Spark, MD  glucose monitoring kit (FREESTYLE) monitoring kit 1 each by Does  not apply route as needed for other. Dispense any model that is covered- dispense testing supplies for Q AC/ HS accuchecks- 1 month supply with one refil. 08/20/13   Lorayne Marek, MD  hydrALAZINE (APRESOLINE) 100 MG tablet Take 1 tablet by mouth twice daily 07/22/20   Dorothy Spark, MD  isosorbide mononitrate (IMDUR) 120 MG 24 hr tablet Take 1 tablet (120 mg total) by mouth daily. 07/22/20   Dorothy Spark, MD  lisinopril (ZESTRIL) 40 MG tablet Take 1 tablet by mouth daily 06/28/20   Dorothy Spark, MD  metFORMIN (GLUCOPHAGE) 500 MG tablet TAKE 1 TABLET BY MOUTH TWICE DAILY WITH A MEAL 02/23/20   Dorothy Spark, MD  Multiple Vitamin (MULTIVITAMIN) tablet Take 1 tablet by mouth daily.    [provider]  Nebivolol HCl (BYSTOLIC) 20 MG TABS TAKE 1 TABLET BY MOUTH DAILY AT BEDTIME 07/19/20   Dorothy Spark, MD  nitroGLYCERIN (NITROSTAT) 0.4 MG SL tablet Place 1 tablet (0.4 mg total) under the tongue every 5 (five) minutes as needed for chest pain. 05/14/19   Dorothy Spark, MD  Omega-3 Fatty Acids (FISH OIL) 1000 MG CAPS Take 1,000 mg by mouth every evening.    [provider]    Family History Family History  Problem Relation Age of Onset   Hypertension Mother    Heart disease Mother    Hypertension Father    Heart disease Father    Heart disease Paternal Uncle    Heart disease Maternal Grandmother     Social History Social History   Tobacco Use   Smoking status: Former    Packs/day: 0.25    Pack years: 0.00    Types: Cigarettes    Quit date: 06/18/2017    Years since quitting: 3.3   Smokeless tobacco: Never  Vaping Use   Vaping Use: Never used  Substance Use Topics   Alcohol use: Yes    Comment: 3 beers and a shot per day- last use yesterday   Drug use: No     Allergies   Naproxen sodium   Review of Systems Review of Systems  As stated above in HPI Physical Exam Triage Vital Signs ED Triage Vitals  Enc Vitals Group     BP  10/28/20 1237 113/72     Pulse Rate 10/28/20 1237 82     Resp 10/28/20 1237 16     Temp 10/28/20 1237 98 F (36.7 C)     Temp Source 10/28/20 1237 Oral     SpO2 10/28/20 1237 97 %     Weight --      Height --      Head Circumference --      Peak Flow --      Pain Score 10/28/20 1233 5     Pain Loc --      Pain Edu? --      Excl. in [place]? --    No data found.  Updated Vital Signs BP 113/72  Pulse 82   Temp 98 F (36.7 C) (Oral)   Resp 16   SpO2 97%   Physical Exam Vitals and nursing note reviewed.  Constitutional:      Appearance: Normal appearance.  Musculoskeletal:        General: No tenderness. Normal range of motion.  Skin:    Comments: There is a 1.5 inch laceration of the left medial left 2nd digit distal to the PIP joint  Neurological:     Mental Status: He is alert.     UC Treatments / Results  Labs (all labs ordered are listed, but only abnormal results are displayed) Labs Reviewed - No data to display  EKG   Radiology No results found.  Procedures Procedures (including critical care time)  Medications Ordered in UC Medications - No data to display  Initial Impression / Assessment and Plan / UC Course  I have reviewed the triage vital signs and the nursing notes.  Pertinent labs & imaging results that were available during my care of the patient were reviewed by me and considered in my medical decision making (see chart for details).     New.  I discussed with patient that he has had this area open for too long for [place] to close it safely.  The area has already begun to start healing.  We discussed the risks associated with closing the wound now.  For now I have cleaned with Hibiclens, applied Steri-Strips, wrapped the wound and discussed wound care.  We are going to have him start antibiotics given the nature of the wound and location.  Discussed red flag signs and symptoms.   Final Clinical Impressions(s) / UC Diagnoses   Final diagnoses:   None   Discharge Instructions   None    ED Prescriptions   None    PDMP not reviewed this encounter.   Hughie Closs, [place] 10/28/20 1322

## 2021-02-06 ENCOUNTER — Other Ambulatory Visit: Payer: Self-pay

## 2021-02-06 MED ORDER — LISINOPRIL 40 MG PO TABS: 40.0000 mg | ORAL_TABLET | Freq: Every day | ORAL | 0 refills | Status: DC

## 2021-02-06 NOTE — Telephone Encounter (Signed)
Pt's medication was sent to pt's pharmacy as requested. Confirmation received.  °

## 2021-04-07 ENCOUNTER — Other Ambulatory Visit: Payer: Self-pay

## 2021-04-07 MED ORDER — LISINOPRIL 40 MG PO TABS: 40.0000 mg | ORAL_TABLET | Freq: Every day | ORAL | 0 refills | Status: DC

## 2021-04-07 MED ORDER — AMLODIPINE BESYLATE 10 MG PO TABS: 10.0000 mg | ORAL_TABLET | Freq: Every day | ORAL | 0 refills | Status: DC

## 2021-06-09 ENCOUNTER — Telehealth: Payer: Self-pay | Admitting: Cardiology

## 2021-06-09 DIAGNOSIS — I10 Essential (primary) hypertension: Secondary | ICD-10-CM

## 2021-06-09 DIAGNOSIS — I5023 Acute on chronic systolic (congestive) heart failure: Secondary | ICD-10-CM

## 2021-06-09 DIAGNOSIS — R079 Chest pain, unspecified: Secondary | ICD-10-CM

## 2021-06-09 DIAGNOSIS — R0609 Other forms of dyspnea: Secondary | ICD-10-CM

## 2021-06-09 MED ORDER — NEBIVOLOL HCL 20 MG PO TABS: ORAL_TABLET | ORAL | 0 refills | Status: DC

## 2021-06-09 MED ORDER — AMLODIPINE BESYLATE 10 MG PO TABS: 10.0000 mg | ORAL_TABLET | Freq: Every day | ORAL | 0 refills | Status: DC

## 2021-06-09 MED ORDER — ISOSORBIDE MONONITRATE ER 120 MG PO TB24: 120.0000 mg | ORAL_TABLET | Freq: Every day | ORAL | 0 refills | Status: DC

## 2021-06-09 MED ORDER — LISINOPRIL 40 MG PO TABS: 40.0000 mg | ORAL_TABLET | Freq: Every day | ORAL | 0 refills | Status: DC

## 2021-06-09 NOTE — Telephone Encounter (Signed)
Pt scheduled to see Dr. Johney Frame, 06/16/21, sent in refills for #30 to [place].

## 2021-06-09 NOTE — Telephone Encounter (Signed)
°*  STAT* If patient is at the pharmacy, call can be transferred to refill team.   1. Which medications need to be refilled? (please list name of each medication and dose if known) Amlodipine, Lisinopril ,Bystolic and Isosorbide  2. Which pharmacy/location (including street and city if local pharmacy) is medication to be sent to?[facility] [address] [place]  3. Do they need a 30 day or 90 day supply? Completely out, need enough until his appointment on 06-16-21- been working out of town

## 2021-06-14 NOTE — Progress Notes (Deleted)
Cardiology Office Note:    Date:  06/14/2021   ID:  Joseph Shannon, DOB 04/11/1981, MRN [id number]  PCP:  Deborah Chalk, [facility] Providers Cardiologist:  Ena Dawley, MD {     Referring MD: Deborah Chalk, FNP    History of Present Illness:    Brisen Gaffey is a 41 y.o. male with a hx of history of nonischemic cardiomyopathy with combined systolic and diastolic CHF prior EF [id number] normal on echo 2018, hypertension, DM, TIA, CKD, chronic chest pain, asthma who was previously followed by Dr. Meda Coffee who now presents to clinic for follow-up.  Had normal coronary CT imaging 2018, echo 04/2018 normal LVEF 60 to 65% grade 1 DD, no significant valvular abnormality.  Last seen in clinic 06/2020 where he was doing well. No HF symptoms.   Past Medical History:  Diagnosis Date   Asthma    Chronic combined systolic and diastolic CHF (congestive heart failure) (HCC)    Diabetes mellitus without complication ([place])    Hypertension    NICM (nonischemic cardiomyopathy) (HCC)    Normal coronary arteries    OSA (obstructive sleep apnea)    TIA (transient ischemic attack) 2014    Past Surgical History:  Procedure Laterality Date   HIP CLOSED REDUCTION Right 07/12/2018   Procedure: CLOSED REDUCTION HIP;  Surgeon: Erle Crocker, MD;  Location: [place];  Service: Orthopedics;  Laterality: Right;   INSERTION OF TRACTION PIN Right 07/12/2018   Procedure: INSERTION OF TRACTION PIN;  Surgeon: Erle Crocker, MD;  Location: [place];  Service: Orthopedics;  Laterality: Right;   NO PAST SURGERIES     OPEN REDUCTION INTERNAL FIXATION ACETABULUM POSTERIOR LATERAL Right 07/14/2018   Procedure: OPEN REDUCTION INTERNAL FIXATION ACETABULUM POSTERIOR LATERAL;  Surgeon: Shona Needles, MD;  Location: [facility];  Service: Orthopedics;  Laterality: Right;    Current Medications: No outpatient medications have been marked as taking for the 06/16/21 encounter (Appointment) with Freada Bergeron, MD.     Allergies:   Naproxen sodium   Social History   Socioeconomic History   Marital status: Single    Spouse name: Not on file   Number of children: Not on file   Years of education: Not on file   Highest education level: Not on file  Occupational History   Not on file  Tobacco Use   Smoking status: Former    Packs/day: 0.25    Types: Cigarettes    Quit date: 06/18/2017    Years since quitting: 3.9   Smokeless tobacco: Never  Vaping Use   Vaping Use: Never used  Substance and Sexual Activity   Alcohol use: Yes    Comment: 3 beers and a shot per day- last use yesterday   Drug use: No   Sexual activity: Not on file  Other Topics Concern   Not on file  Social History Narrative   Not on file   Social Determinants of Health   Financial Resource Strain: Not on file  Food Insecurity: Not on file  Transportation Needs: Not on file  Physical Activity: Not on file  Stress: Not on file  Social Connections: Not on file     Family History: The patient's ***family history includes Heart disease in his father, maternal grandmother, mother, and paternal uncle; Hypertension in his father and mother.  ROS:   Please see the history of present illness.    *** All other systems reviewed and are negative.  EKGs/Labs/Other  Studies Reviewed:    The following studies were reviewed today: Echo 1/21/2020Study Conclusions   - Left ventricle: The cavity size was normal. There was mild    concentric hypertrophy. Systolic function was normal. The    estimated ejection fraction was in the range of 60% to 65%. Wall    motion was normal; there were no regional wall motion    abnormalities. Doppler parameters are consistent with abnormal    left ventricular relaxation (grade 1 diastolic dysfunction).  - Aortic valve: Transvalvular velocity was within the normal range.    There was no stenosis. There was no regurgitation.  - Mitral valve: Transvalvular velocity was within  the normal range.    There was no evidence for stenosis. There was no regurgitation.  - Left atrium: The atrium was moderately dilated.  - Right ventricle: The cavity size was normal. Wall thickness was    normal. Systolic function was normal.  - Atrial septum: No defect or patent foramen ovale was identified    by color flow Doppler.  - Tricuspid valve: There was mild regurgitation.  - Pulmonary arteries: Systolic pressure was mildly increased. PA    peak pressure: 38 mm Hg (S).    Coronary CTA 4/2018IMPRESSION: 1. Coronary calcium score of 0. This was 0 percentile for age and sex matched control.   2. Normal coronary origin with right dominance.   3. No evidence of CAD.   4. Mildly dilated pulmonary artery suggestive of pulmonary hypertension.    EKG:  EKG is *** ordered today.  The ekg ordered today demonstrates ***  Recent Labs: No results found for requested labs within last 8760 hours.  Recent Lipid Panel    Component Value Date/Time   CHOL 191 01/20/2020 0948   TRIG 86 01/20/2020 0948   HDL 64 01/20/2020 0948   CHOLHDL 3.0 01/20/2020 0948   CHOLHDL 3.4 08/31/2015 0920   VLDL 18 08/31/2015 0920   LDLCALC 111 (H) 01/20/2020 0948     Risk Assessment/Calculations:   {Does this patient have ATRIAL FIBRILLATION?:[phone]}       Physical Exam:    VS:  There were no vitals taken for this visit.    Wt Readings from Last 3 Encounters:  07/19/20 234 lb 3.2 oz (106.2 kg)  04/17/20 232 lb (105.2 kg)  01/20/20 230 lb 3.2 oz (104.4 kg)     GEN: *** Well nourished, well developed in no acute distress HEENT: Normal NECK: No JVD; No carotid bruits LYMPHATICS: No lymphadenopathy CARDIAC: ***RRR, no murmurs, rubs, gallops RESPIRATORY:  Clear to auscultation without rales, wheezing or rhonchi  ABDOMEN: Soft, non-tender, non-distended MUSCULOSKELETAL:  No edema; No deformity  SKIN: Warm and dry NEUROLOGIC:  Alert and oriented x 3 PSYCHIATRIC:  Normal affect    ASSESSMENT:    No diagnosis found. PLAN:    In order of problems listed above:  #Chronic Combined Systolic and Diastolic HF: #History of NICM: LVEF previously 45% normalized on echo 05/20/2018. Coronary CTA calcium score 0 no evidence of CAD on coronary CT 07/2016-no chest pain, he is NYHA class I completely asymptomatic.   #HTN: -Continue imdur 120mg  daily -Continue lisinopril 40mg  daily -Continue bystolic 20mg  qHS -Continue hydralazine 100mg  TID -Continue amlodipine 10mg  daily  #HLD: -Continue lipitor 40mg  daily  #History of TIA: -Continue lipitor 40mg  daily     {Are you ordering a CV Procedure (e.g. stress test, cath, DCCV, TEE, etc)?   Press F2        :[id number]  Medication Adjustments/Labs and Tests Ordered: Current medicines are reviewed at length with the patient today.  Concerns regarding medicines are outlined above.  No orders of the defined types were placed in this encounter.  No orders of the defined types were placed in this encounter.   There are no Patient Instructions on file for this visit.   Signed, Freada Bergeron, MD  06/14/2021 6:51 AM    [place]

## 2021-06-16 ENCOUNTER — Other Ambulatory Visit: Payer: Self-pay

## 2021-06-16 ENCOUNTER — Encounter: Payer: Self-pay | Admitting: Cardiology

## 2021-06-16 ENCOUNTER — Encounter: Payer: Self-pay | Admitting: *Deleted

## 2021-06-16 ENCOUNTER — Ambulatory Visit (INDEPENDENT_AMBULATORY_CARE_PROVIDER_SITE_OTHER): Payer: 59 | Admitting: Cardiology

## 2021-06-16 VITALS — BP 138/84 | HR 82 | Ht 74.5 in | Wt 229.6 lb

## 2021-06-16 DIAGNOSIS — I428 Other cardiomyopathies: Secondary | ICD-10-CM

## 2021-06-16 DIAGNOSIS — I5023 Acute on chronic systolic (congestive) heart failure: Secondary | ICD-10-CM

## 2021-06-16 DIAGNOSIS — R0609 Other forms of dyspnea: Secondary | ICD-10-CM | POA: Diagnosis not present

## 2021-06-16 DIAGNOSIS — E1169 Type 2 diabetes mellitus with other specified complication: Secondary | ICD-10-CM

## 2021-06-16 DIAGNOSIS — Z8673 Personal history of transient ischemic attack (TIA), and cerebral infarction without residual deficits: Secondary | ICD-10-CM

## 2021-06-16 DIAGNOSIS — R079 Chest pain, unspecified: Secondary | ICD-10-CM

## 2021-06-16 DIAGNOSIS — I5042 Chronic combined systolic (congestive) and diastolic (congestive) heart failure: Secondary | ICD-10-CM

## 2021-06-16 DIAGNOSIS — I10 Essential (primary) hypertension: Secondary | ICD-10-CM

## 2021-06-16 MED ORDER — FLUTICASONE PROPIONATE HFA 110 MCG/ACT IN AERO: 3.0000 | INHALATION_SPRAY | Freq: Three times a day (TID) | RESPIRATORY_TRACT | 3 refills | Status: AC

## 2021-06-16 MED ORDER — ALBUTEROL SULFATE HFA 108 (90 BASE) MCG/ACT IN AERS: 2.0000 | INHALATION_SPRAY | Freq: Every day | RESPIRATORY_TRACT | 3 refills | Status: AC | PRN

## 2021-06-16 MED ORDER — NITROGLYCERIN 0.4 MG SL SUBL: 0.4000 mg | SUBLINGUAL_TABLET | SUBLINGUAL | 5 refills | Status: AC | PRN

## 2021-06-16 MED ORDER — ATORVASTATIN CALCIUM 40 MG PO TABS: 40.0000 mg | ORAL_TABLET | Freq: Every day | ORAL | 3 refills | Status: DC

## 2021-06-16 MED ORDER — SPIRONOLACTONE 25 MG PO TABS: 25.0000 mg | ORAL_TABLET | Freq: Every day | ORAL | 3 refills | Status: DC

## 2021-06-16 MED ORDER — NEBIVOLOL HCL 20 MG PO TABS: ORAL_TABLET | ORAL | 3 refills | Status: DC

## 2021-06-16 MED ORDER — LISINOPRIL 40 MG PO TABS: 40.0000 mg | ORAL_TABLET | Freq: Every day | ORAL | 3 refills | Status: DC

## 2021-06-16 MED ORDER — ISOSORBIDE MONONITRATE ER 120 MG PO TB24: 120.0000 mg | ORAL_TABLET | Freq: Every day | ORAL | 3 refills | Status: DC

## 2021-06-16 MED ORDER — FUROSEMIDE 40 MG PO TABS: 40.0000 mg | ORAL_TABLET | Freq: Two times a day (BID) | ORAL | 4 refills | Status: DC | PRN

## 2021-06-16 MED ORDER — AMLODIPINE BESYLATE 10 MG PO TABS: 10.0000 mg | ORAL_TABLET | Freq: Every day | ORAL | 3 refills | Status: DC

## 2021-06-16 MED ORDER — HYDRALAZINE HCL 100 MG PO TABS: 100.0000 mg | ORAL_TABLET | Freq: Two times a day (BID) | ORAL | 3 refills | Status: DC

## 2021-06-16 NOTE — Patient Instructions (Signed)
Medication Instructions:   DECREASE YOUR LASIX TO TAKING 40 MG BY MOUTH TWICE DAILY AS NEEDED FOR LOWER EXTREMITY EDEMA   START TAKING SPIRONOLACTONE 25 MG BY MOUTH DAILY  *If you need a refill on your cardiac medications before your next appointment, please call your pharmacy*   Lab Work:  1 WEEK HERE IN THE OFFICE--CHECK BMET  If you have labs (blood work) drawn today and your tests are completely normal, you will receive your results only by: MyChart Message (if you have MyChart) OR A paper copy in the mail If you have any lab test that is abnormal or we need to change your treatment, we will call you to review the results.   Follow-Up: At [facility], you and your health needs are our priority.  As part of our continuing mission to provide you with exceptional heart care, we have created designated Provider Care Teams.  These Care Teams include your primary Cardiologist (physician) and Advanced Practice Providers (APPs -  Physician Assistants and Nurse Practitioners) who all work together to provide you with the care you need, when you need it.  We recommend signing up for the patient portal called "MyChart".  Sign up information is provided on this After Visit Summary.  MyChart is used to connect with patients for Virtual Visits (Telemedicine).  Patients are able to view lab/test results, encounter notes, upcoming appointments, etc.  Non-urgent messages can be sent to your provider as well.   To learn more about what you can do with MyChart, go to [email or web address].    Your next appointment:   1 year(s)  The format for your next appointment:   In Person  Provider:   DR. Shari Prows

## 2021-06-16 NOTE — Progress Notes (Signed)
Cardiology Office Note:    Date:  06/16/2021   ID:  Joseph Shannon, DOB 25-Jan-1981, MRN [id number]  PCP:  Deborah Chalk, [facility] Providers Cardiologist:  Ena Dawley, MD {    Referring MD: Deborah Chalk, FNP   History of Present Illness:    Joseph Shannon is a 41 y.o. male with a hx of history of nonischemic cardiomyopathy with combined systolic and diastolic CHF prior EF 53% normal on echo 2018, hypertension, DM, TIA, CKD, chronic chest pain, asthma who was previously followed by Dr. Meda Coffee who now presents to clinic for follow-up.  Had normal coronary CT imaging 2018, echo 04/2018 normal LVEF 60 to 65% grade 1 DD, no significant valvular abnormality.  Last seen in clinic 06/2020 where he was doing well. No HF symptoms.   Today, the patient states that he is feeling good overall. At home his blood pressure has been typically similar to in clinic today (138/84). He reports at one time last week it was 190/80 at another clinic, but this is atypical. Usually he takes hydralazine, amlodipine, and lisinopril in the mornings, and then Imdur, hydralazine and Nebivolol at night. Denies any HF or anginal symptoms.  Lately he has not needed to take Lasix. He has also not been taking his cholesterol medication. He remains compliant otherwise.  Generally he stays active through work. He plans to increase his exercise and work on his diet more. He continues to avoid smoking.  He denies any palpitations, chest pain, shortness of breath, or peripheral edema. No lightheadedness, headaches, syncope, orthopnea, or PND.   Past Medical History:  Diagnosis Date   Asthma    Chronic combined systolic and diastolic CHF (congestive heart failure) (HCC)    Diabetes mellitus without complication ([place])    Hypertension    NICM (nonischemic cardiomyopathy) (HCC)    Normal coronary arteries    OSA (obstructive sleep apnea)    TIA (transient ischemic attack) 2014    Past Surgical  History:  Procedure Laterality Date   HIP CLOSED REDUCTION Right 07/12/2018   Procedure: CLOSED REDUCTION HIP;  Surgeon: Erle Crocker, MD;  Location: [place];  Service: Orthopedics;  Laterality: Right;   INSERTION OF TRACTION PIN Right 07/12/2018   Procedure: INSERTION OF TRACTION PIN;  Surgeon: Erle Crocker, MD;  Location: [place];  Service: Orthopedics;  Laterality: Right;   NO PAST SURGERIES     OPEN REDUCTION INTERNAL FIXATION ACETABULUM POSTERIOR LATERAL Right 07/14/2018   Procedure: OPEN REDUCTION INTERNAL FIXATION ACETABULUM POSTERIOR LATERAL;  Surgeon: Shona Needles, MD;  Location: [facility];  Service: Orthopedics;  Laterality: Right;    Current Medications: Current Meds  Medication Sig   albuterol (PROVENTIL HFA;VENTOLIN HFA) 108 (90 BASE) MCG/ACT inhaler Inhale 2 puffs into the lungs daily as needed for wheezing or shortness of breath.    albuterol (PROVENTIL) (2.5 MG/3ML) 0.083% nebulizer solution Take 2.5 mg by nebulization every 4 (four) hours as needed for wheezing or shortness of breath.   amLODipine (NORVASC) 10 MG tablet Take 1 tablet (10 mg total) by mouth daily.   atorvastatin (LIPITOR) 40 MG tablet Take 1 tablet (40 mg total) by mouth daily.   fluticasone (FLOVENT HFA) 110 MCG/ACT inhaler Inhale 3 puffs into the lungs 3 (three) times daily.   glucose monitoring kit (FREESTYLE) monitoring kit 1 each by Does not apply route as needed for other. Dispense any model that is covered- dispense testing supplies for Q AC/ HS accuchecks- 1 month  supply with one refil.   hydrALAZINE (APRESOLINE) 100 MG tablet Take 1 tablet by mouth twice daily   isosorbide mononitrate (IMDUR) 120 MG 24 hr tablet Take 1 tablet (120 mg total) by mouth daily.   lisinopril (ZESTRIL) 40 MG tablet Take 1 tablet (40 mg total) by mouth daily. Please make appt with Dr. Johney Frame (new Cardiologist) before anymore refills. Thank you 2nd attempt   Multiple Vitamin (MULTIVITAMIN) tablet Take 1 tablet by  mouth daily.   Nebivolol HCl (BYSTOLIC) 20 MG TABS TAKE 1 TABLET BY MOUTH DAILY AT BEDTIME   nitroGLYCERIN (NITROSTAT) 0.4 MG SL tablet Place 1 tablet (0.4 mg total) under the tongue every 5 (five) minutes as needed for chest pain.   Omega-3 Fatty Acids (FISH OIL) 1000 MG CAPS Take 1,000 mg by mouth every evening.   [DISCONTINUED] furosemide (LASIX) 40 MG tablet Take 1 tablet (40 mg total) by mouth 2 (two) times daily.     Allergies:   Naproxen sodium   Social History   Socioeconomic History   Marital status: Single    Spouse name: Not on file   Number of children: Not on file   Years of education: Not on file   Highest education level: Not on file  Occupational History   Not on file  Tobacco Use   Smoking status: Former    Packs/day: 0.25    Types: Cigarettes    Quit date: 06/18/2017    Years since quitting: 3.9   Smokeless tobacco: Never  Vaping Use   Vaping Use: Never used  Substance and Sexual Activity   Alcohol use: Yes    Comment: 3 beers and a shot per day- last use yesterday   Drug use: No   Sexual activity: Not on file  Other Topics Concern   Not on file  Social History Narrative   Not on file   Social Determinants of Health   Financial Resource Strain: Not on file  Food Insecurity: Not on file  Transportation Needs: Not on file  Physical Activity: Not on file  Stress: Not on file  Social Connections: Not on file     Family History: The patient's family history includes Heart disease in his father, maternal grandmother, mother, and paternal uncle; Hypertension in his father and mother.  ROS:   Review of Systems  Constitutional:  Negative for chills and diaphoresis.  HENT:  Negative for nosebleeds and tinnitus.   Eyes:  Negative for photophobia and pain.  Respiratory:  Negative for hemoptysis and sputum production.   Cardiovascular:  Negative for chest pain, palpitations, orthopnea, claudication, leg swelling and PND.  Gastrointestinal:  Negative for  diarrhea and heartburn.  Genitourinary:  Negative for dysuria and hematuria.  Musculoskeletal:  Negative for joint pain and myalgias.  Neurological:  Negative for dizziness and seizures.  Endo/Heme/Allergies:  Negative for environmental allergies.  Psychiatric/Behavioral:  Negative for hallucinations. The patient does not have insomnia.     EKGs/Labs/Other Studies Reviewed:    The following studies were reviewed today:  Echo 05/20/2018: Study Conclusions   - Left ventricle: The cavity size was normal. There was mild    concentric hypertrophy. Systolic function was normal. The    estimated ejection fraction was in the range of 60% to 65%. Wall    motion was normal; there were no regional wall motion    abnormalities. Doppler parameters are consistent with abnormal    left ventricular relaxation (grade 1 diastolic dysfunction).  - Aortic valve: Transvalvular velocity was within  the normal range.    There was no stenosis. There was no regurgitation.  - Mitral valve: Transvalvular velocity was within the normal range.    There was no evidence for stenosis. There was no regurgitation.  - Left atrium: The atrium was moderately dilated.  - Right ventricle: The cavity size was normal. Wall thickness was    normal. Systolic function was normal.  - Atrial septum: No defect or patent foramen ovale was identified    by color flow Doppler.  - Tricuspid valve: There was mild regurgitation.  - Pulmonary arteries: Systolic pressure was mildly increased. PA    peak pressure: 38 mm Hg (S).   Monitor 04/2017: Sinus bradycardia to sinus rhythm.   Normal 30 day event monitor. Sinus bradycardia predominantly at night with max pause 2.4 seconds.    Coronary CTA 07/2016 IMPRESSION: 1. Coronary calcium score of 0. This was 0 percentile for age and sex matched control.   2. Normal coronary origin with right dominance.   3. No evidence of CAD.   4. Mildly dilated pulmonary artery suggestive of  pulmonary hypertension.    EKG:  EKG is personally reviewed. 06/16/2021 : Sinus rhythm. Rate 82 bpm.  Recent Labs: No results found for requested labs within last 8760 hours.   Recent Lipid Panel    Component Value Date/Time   CHOL 191 01/20/2020 0948   TRIG 86 01/20/2020 0948   HDL 64 01/20/2020 0948   CHOLHDL 3.0 01/20/2020 0948   CHOLHDL 3.4 08/31/2015 0920   VLDL 18 08/31/2015 0920   LDLCALC 111 (H) 01/20/2020 0948     Risk Assessment/Calculations:           Physical Exam:    VS:  BP 138/84    Pulse 82    Ht 6' 2.5" (1.892 m)    Wt 229 lb 9.6 oz (104.1 kg)    SpO2 96%    BMI 29.08 kg/m     Wt Readings from Last 3 Encounters:  06/16/21 229 lb 9.6 oz (104.1 kg)  07/19/20 234 lb 3.2 oz (106.2 kg)  04/17/20 232 lb (105.2 kg)     GEN: Well nourished, well developed in no acute distress HEENT: Normal NECK: No JVD; No carotid bruits CARDIAC: RRR, no murmurs, rubs, gallops RESPIRATORY:  Clear to auscultation without rales, wheezing or rhonchi  ABDOMEN: Soft, non-tender, non-distended MUSCULOSKELETAL:  No edema; No deformity  SKIN: Warm and dry NEUROLOGIC:  Alert and oriented x 3 PSYCHIATRIC:  Normal affect   ASSESSMENT:    1. NICM (nonischemic cardiomyopathy) ([place])   2. Essential hypertension   3. DOE (dyspnea on exertion)   4. Acute on chronic systolic CHF (congestive heart failure) (HCC)   5. Chest pain, unspecified type   6. Chronic combined systolic and diastolic CHF (congestive heart failure) ([place])   7. History of TIA (transient ischemic attack)   8. Type 2 diabetes mellitus with other specified complication, without long-term current use of insulin (HCC)    PLAN:    In order of problems listed above:  #Chronic Combined Systolic and Diastolic HF: #History of NICM: LVEF previously 45% normalized to 60-65% on echo 05/20/2018. Coronary CTA calcium score 0 no evidence of CAD on coronary CT 07/2016-no chest pain, he is NYHA class I completely asymptomatic.  Euvolemic today.  -Continue imdur 163m daily -Continue lisinopril 441mdaily -Continue bystolic 2007WKHS -Continue hydralazine 10024mID -Lasix prn -Start spironolactone 75m52mily -BMET next week -Low Na diet  #HTN: Still elevated  in clinic today. Mainly 130-140s.  -Continue imdur 138m daily -Continue lisinopril 441mdaily -Continue bystolic 2070WCHS -Continue hydralazine 1001mID -Continue amlodipine 57m89mily -Start spironolactone 25mg3mly -Check BMET next week  #HLD: LDL 102. Admits to not being compliant with lipitor. -Continue lipitor 40mg 63my; encouraged compliance  #History of TIA: -Continue lipitor 40mg d66m; encouraged compliance  #Asthma: -Will renew inhalers today        Follow-up in 1 year.  Medication Adjustments/Labs and Tests Ordered: Current medicines are reviewed at length with the patient today.  Concerns regarding medicines are outlined above.   No orders of the defined types were placed in this encounter.  No orders of the defined types were placed in this encounter.  There are no Patient Instructions on file for this visit.   I,Mathew Stumpf,acting as a scribe Education administratoratherFreada Bergeronave documented all relevant documentation on the behalf of HeatherFreada Bergeron directed by  HeatherFreada Bergeronile in the presence of HeatherFreada BergeronI, HeatherFreada Bergeronave reviewed all documentation for this visit. The documentation on 06/16/21 for the exam, diagnosis, procedures, and orders are all accurate and complete.   Signed, HeatherFreada Bergeron/17/2023 10:40 AM    [place]

## 2021-06-23 ENCOUNTER — Other Ambulatory Visit: Payer: Self-pay

## 2021-06-23 ENCOUNTER — Other Ambulatory Visit: Payer: 59 | Admitting: *Deleted

## 2021-06-23 DIAGNOSIS — I5042 Chronic combined systolic (congestive) and diastolic (congestive) heart failure: Secondary | ICD-10-CM

## 2021-06-23 DIAGNOSIS — Z8673 Personal history of transient ischemic attack (TIA), and cerebral infarction without residual deficits: Secondary | ICD-10-CM

## 2021-06-23 DIAGNOSIS — I10 Essential (primary) hypertension: Secondary | ICD-10-CM

## 2021-06-23 DIAGNOSIS — R079 Chest pain, unspecified: Secondary | ICD-10-CM

## 2021-06-23 DIAGNOSIS — R0609 Other forms of dyspnea: Secondary | ICD-10-CM

## 2021-06-23 DIAGNOSIS — E1169 Type 2 diabetes mellitus with other specified complication: Secondary | ICD-10-CM

## 2021-06-23 DIAGNOSIS — I428 Other cardiomyopathies: Secondary | ICD-10-CM

## 2021-06-23 DIAGNOSIS — I5023 Acute on chronic systolic (congestive) heart failure: Secondary | ICD-10-CM

## 2021-06-24 LAB — BASIC METABOLIC PANEL
BUN/Creatinine Ratio: 14 (ref 9–20)
BUN: 16 mg/dL (ref 6–24)
CO2: 24 mmol/L (ref 20–29)
Calcium: 9.4 mg/dL (ref 8.7–10.2)
Chloride: 104 mmol/L (ref 96–106)
Creatinine, Ser: 1.15 mg/dL (ref 0.76–1.27)
Glucose: 96 mg/dL (ref 70–99)
Potassium: 4.3 mmol/L (ref 3.5–5.2)
Sodium: 141 mmol/L (ref 134–144)
eGFR: 83 mL/min/{1.73_m2} (ref >59)

## 2022-08-20 ENCOUNTER — Telehealth: Payer: Self-pay | Admitting: Cardiology

## 2022-08-20 ENCOUNTER — Other Ambulatory Visit: Payer: Self-pay

## 2022-08-20 ENCOUNTER — Other Ambulatory Visit: Payer: Self-pay | Admitting: *Deleted

## 2022-08-20 DIAGNOSIS — R079 Chest pain, unspecified: Secondary | ICD-10-CM

## 2022-08-20 DIAGNOSIS — I10 Essential (primary) hypertension: Secondary | ICD-10-CM | POA: Diagnosis not present

## 2022-08-20 DIAGNOSIS — R635 Abnormal weight gain: Secondary | ICD-10-CM | POA: Diagnosis not present

## 2022-08-20 DIAGNOSIS — R0609 Other forms of dyspnea: Secondary | ICD-10-CM

## 2022-08-20 DIAGNOSIS — I428 Other cardiomyopathies: Secondary | ICD-10-CM

## 2022-08-20 DIAGNOSIS — I5042 Chronic combined systolic (congestive) and diastolic (congestive) heart failure: Secondary | ICD-10-CM

## 2022-08-20 DIAGNOSIS — E1169 Type 2 diabetes mellitus with other specified complication: Secondary | ICD-10-CM

## 2022-08-20 DIAGNOSIS — I5023 Acute on chronic systolic (congestive) heart failure: Secondary | ICD-10-CM

## 2022-08-20 DIAGNOSIS — L729 Follicular cyst of the skin and subcutaneous tissue, unspecified: Secondary | ICD-10-CM | POA: Diagnosis not present

## 2022-08-20 DIAGNOSIS — Z8673 Personal history of transient ischemic attack (TIA), and cerebral infarction without residual deficits: Secondary | ICD-10-CM

## 2022-08-20 DIAGNOSIS — L089 Local infection of the skin and subcutaneous tissue, unspecified: Secondary | ICD-10-CM | POA: Diagnosis not present

## 2022-08-20 MED ORDER — LISINOPRIL 40 MG PO TABS
40.0000 mg | ORAL_TABLET | Freq: Every day | ORAL | 0 refills | Status: DC
Start: 2022-08-20 — End: 2023-01-01

## 2022-08-20 MED ORDER — NEBIVOLOL HCL 20 MG PO TABS
ORAL_TABLET | ORAL | 0 refills | Status: DC
Start: 2022-08-20 — End: 2022-08-20

## 2022-08-20 MED ORDER — LISINOPRIL 40 MG PO TABS: 40.0000 mg | ORAL_TABLET | Freq: Every day | ORAL | 0 refills | Status: DC

## 2022-08-20 MED ORDER — NEBIVOLOL HCL 20 MG PO TABS
ORAL_TABLET | ORAL | 0 refills | Status: DC
Start: 2022-08-20 — End: 2023-01-01

## 2022-08-20 MED ORDER — AMLODIPINE BESYLATE 10 MG PO TABS: 10.0000 mg | ORAL_TABLET | Freq: Every day | ORAL | 0 refills | Status: DC

## 2022-08-20 MED ORDER — HYDRALAZINE HCL 100 MG PO TABS: 100.0000 mg | ORAL_TABLET | Freq: Two times a day (BID) | ORAL | 0 refills | Status: DC

## 2022-08-20 MED ORDER — NEBIVOLOL HCL 20 MG PO TABS: ORAL_TABLET | ORAL | 0 refills | Status: DC

## 2022-08-20 NOTE — Telephone Encounter (Signed)
*  STAT* If patient is at the pharmacy, call can be transferred to refill team.   1. Which medications need to be refilled? (please list name of each medication and dose if known)  amLODipine (NORVASC) 10 MG tablet Nebivolol HCl (BYSTOLIC) 20 MG TABS  2. Which pharmacy/location (including street and city if local pharmacy) is medication to be sent to? [facility] - [place] ([place]),  - [address]    3. Do they need a 30 day or 90 day supply?  90 day supply  Patient states he is completely out of medication

## 2022-08-20 NOTE — Telephone Encounter (Signed)
Pt's medications were sent to pt's pharmacy as requested. Confirmation received.  

## 2022-08-21 NOTE — Progress Notes (Addendum)
Office Visit    Patient Name: Joseph Shannon Date of Encounter: 08/21/2022  Primary Care Provider:  Eather Colas, FNP Primary Cardiologist:  Tobias Alexander, MD Primary Electrophysiologist: None   Past Medical History    Past Medical History:  Diagnosis Date   Asthma    Chronic combined systolic and diastolic CHF (congestive heart failure) (HCC)    Diabetes mellitus without complication (HCC)    Hypertension    NICM (nonischemic cardiomyopathy) (HCC)    Normal coronary arteries    OSA (obstructive sleep apnea)    TIA (transient ischemic attack) 2014   Past Surgical History:  Procedure Laterality Date   HIP CLOSED REDUCTION Right 07/12/2018   Procedure: CLOSED REDUCTION HIP;  Surgeon: Terance Hart, MD;  Location: [facility] OR;  Service: Orthopedics;  Laterality: Right;   INSERTION OF TRACTION PIN Right 07/12/2018   Procedure: INSERTION OF TRACTION PIN;  Surgeon: Terance Hart, MD;  Location: [facility] OR;  Service: Orthopedics;  Laterality: Right;   NO PAST SURGERIES     OPEN REDUCTION INTERNAL FIXATION ACETABULUM POSTERIOR LATERAL Right 07/14/2018   Procedure: OPEN REDUCTION INTERNAL FIXATION ACETABULUM POSTERIOR LATERAL;  Surgeon: Roby Lofts, MD;  Location: [facility] OR;  Service: Orthopedics;  Laterality: Right;    Allergies  Allergies  Allergen Reactions   Naproxen Sodium Nausea And Vomiting     History of Present Illness    Joseph Shannon  is a 42 year old male with a PMH of chronic combined systolic and diastolic CHF, NICM, HTN, TIA 2013, DM type II, CKD, asthma, OSA (on CPAP) who presents today for 1 year follow-up.  Most recent 2D echo completed 04/2018 with EF of 60 to 65% and grade 1 DD.  Cardiac CTA completed 07/2016 with no evidence of CAD and calcium score of 0.  He was last seen by Dr. Shari Prows on 05/2021 and reported feeling well overall.  He was found to have stable blood pressure and was euvolemic on examination.  [name] presents today  for annual follow-up.  Since last being seen in the office patient reports that he is doing well from a cardiac perspective with no new complaints since his previous visit.  His blood pressure today is well-controlled at 112/74 and heart rate is 71 bpm.  He is euvolemic today on examination and he has been staying active with his work fairly he works as an [employer] and does lots of walking at least 12,000 steps per day and climbs up and down ladders throughout the day.  He recently completed a physical with his PCP positive guaiac and is scheduled to follow-up with gastroenterology next week.  During our visit today we discussed the importance of primary secondary prevention and decreasing progression of coronary artery disease.  He is very motivated to follow a heart healthy diet and has been doing well with meal prep and avoiding foods high in saturated fat.  Patient denies chest pain, palpitations, dyspnea, PND, orthopnea, nausea, vomiting, dizziness, syncope, edema, weight gain, or early satiety.  Home Medications    Current Outpatient Medications  Medication Sig Dispense Refill   albuterol (PROVENTIL) (2.5 MG/3ML) 0.083% nebulizer solution Take 2.5 mg by nebulization every 4 (four) hours as needed for wheezing or shortness of breath.     albuterol (VENTOLIN HFA) 108 (90 Base) MCG/ACT inhaler Inhale 2 puffs into the lungs daily as needed for wheezing or shortness of breath. 3 each 3   amLODipine (NORVASC) 10 MG tablet Take 1  tablet (10 mg total) by mouth daily. 30 tablet 0   atorvastatin (LIPITOR) 40 MG tablet Take 1 tablet (40 mg total) by mouth daily. 90 tablet 3   doxycycline (VIBRAMYCIN) 100 MG capsule Take 1 capsule (100 mg total) by mouth 2 (two) times daily. (Patient not taking: Reported on 06/16/2021) 20 capsule 0   fluticasone (FLOVENT HFA) 110 MCG/ACT inhaler Inhale 3 puffs into the lungs 3 (three) times daily. 1 each 3   furosemide (LASIX) 40 MG tablet Take 1 tablet (40 mg total)  by mouth 2 (two) times daily as needed (for lower extremity swelling). 60 tablet 4   glucose monitoring kit (FREESTYLE) monitoring kit 1 each by Does not apply route as needed for other. Dispense any model that is covered- dispense testing supplies for Q AC/ HS accuchecks- 1 month supply with one refil. 1 each 1   hydrALAZINE (APRESOLINE) 100 MG tablet Take 1 tablet (100 mg total) by mouth 2 (two) times daily. 60 tablet 0   isosorbide mononitrate (IMDUR) 120 MG 24 hr tablet Take 1 tablet (120 mg total) by mouth daily. 90 tablet 3   lisinopril (ZESTRIL) 40 MG tablet Take 1 tablet (40 mg total) by mouth daily. 15 tablet 0   metFORMIN (GLUCOPHAGE) 500 MG tablet TAKE 1 TABLET BY MOUTH TWICE DAILY WITH A MEAL (Patient not taking: Reported on 06/16/2021) 180 tablet 0   Multiple Vitamin (MULTIVITAMIN) tablet Take 1 tablet by mouth daily.     Nebivolol HCl (BYSTOLIC) 20 MG TABS TAKE 1 TABLET BY MOUTH DAILY AT BEDTIME 90 tablet 0   nitroGLYCERIN (NITROSTAT) 0.4 MG SL tablet Place 1 tablet (0.4 mg total) under the tongue every 5 (five) minutes as needed for chest pain. 25 tablet 5   Omega-3 Fatty Acids (FISH OIL) 1000 MG CAPS Take 1,000 mg by mouth every evening.     spironolactone (ALDACTONE) 25 MG tablet Take 1 tablet (25 mg total) by mouth daily. 90 tablet 3   No current facility-administered medications for this visit.     Review of Systems  Please see the history of present illness.    (+) Blood in stool  All other systems reviewed and are otherwise negative except as noted above.  Physical Exam    Wt Readings from Last 3 Encounters:  06/16/21 229 lb 9.6 oz (104.1 kg)  07/19/20 234 lb 3.2 oz (106.2 kg)  04/17/20 232 lb (105.2 kg)   MV:HQION were no vitals filed for this visit.,There is no height or weight on file to calculate BMI.  Constitutional:      Appearance: Healthy appearance. Not in distress.  Neck:     Vascular: JVD normal.  Pulmonary:     Effort: Pulmonary effort is normal.      Breath sounds: No wheezing. No rales. Diminished in the bases Cardiovascular:     Normal rate. Regular rhythm. Normal S1. Normal S2.      Murmurs: There is no murmur.  Edema:    Peripheral edema absent.  Abdominal:     Palpations: Abdomen is soft non tender. There is no hepatomegaly.  Skin:    General: Skin is warm and dry.  Neurological:     General: No focal deficit present.     Mental Status: Alert and oriented to person, place and time.     Cranial Nerves: Cranial nerves are intact.  EKG/LABS/ Recent Cardiac Studies    ECG personally reviewed by me today -sinus rhythm with LVH and rate of 71 bpm  with no acute changes consistent with previous EKG  Cardiac Studies & Procedures       ECHOCARDIOGRAM  ECHOCARDIOGRAM COMPLETE 05/20/2018  Narrative *[facility]* [address] [place], [place] [address] [phone]  ------------------------------------------------------------------- Transthoracic Echocardiography  Patient:    Cleland, Gehris MR #:       [id number] Study Date: 05/20/2018 Gender:     M Age:        37 Height:     193 cm Weight:     104.7 kg BSA:        2.38 m^2 Pt. Status: Room:  Asencion Partridge, Joyce Copa PERFORMING   [facility], Outpatient SONOGRAPHER  [facility], RDCS ATTENDING    Chilton Si, MD REFERRING    Laureen Ochs A  cc:  ------------------------------------------------------------------- LV EF: 60% -   65%  ------------------------------------------------------------------- Indications:      Cardiomyopathy (I42.9).  ------------------------------------------------------------------- History:   PMH:  Asthma, ETOH Use, Non-Ischemic Cardiomyopathy, Obstructive Sleep Apnea.  Congestive heart failure.  Transient ischemic attack.  Risk factors:  Family history of coronary artery disease. Hypertension. Diabetes  mellitus.  ------------------------------------------------------------------- Study Conclusions  - Left ventricle: The cavity size was normal. There was mild concentric hypertrophy. Systolic function was normal. The estimated ejection fraction was in the range of 60% to 65%. Wall motion was normal; there were no regional wall motion abnormalities. Doppler parameters are consistent with abnormal left ventricular relaxation (grade 1 diastolic dysfunction). - Aortic valve: Transvalvular velocity was within the normal range. There was no stenosis. There was no regurgitation. - Mitral valve: Transvalvular velocity was within the normal range. There was no evidence for stenosis. There was no regurgitation. - Left atrium: The atrium was moderately dilated. - Right ventricle: The cavity size was normal. Wall thickness was normal. Systolic function was normal. - Atrial septum: No defect or patent foramen ovale was identified by color flow Doppler. - Tricuspid valve: There was mild regurgitation. - Pulmonary arteries: Systolic pressure was mildly increased. PA peak pressure: 38 mm Hg (S).  ------------------------------------------------------------------- Study data:  Comparison was made to the study of 09/05/2016.  Study status:  Routine.  Procedure:  Transthoracic echocardiography. Image quality was adequate.          Transthoracic echocardiography.  M-mode, complete 2D, 3D, spectral Doppler, and color Doppler.  Birthdate:  Patient birthdate: [birth year].  Age: Patient is 42 yr old.  Sex:  Gender: male.    BMI: 28.1 kg/m^2. Blood pressure:     110/62  Patient status:  Outpatient.  Study date:  Study date: 05/20/2018. Study time: 03:27 PM.  Location: [facility]  -------------------------------------------------------------------  ------------------------------------------------------------------- Left ventricle:  The cavity size was normal. There was mild concentric hypertrophy.  Systolic function was normal. The estimated ejection fraction was in the range of 60% to 65%. Wall motion was normal; there were no regional wall motion abnormalities. Doppler parameters are consistent with abnormal left ventricular relaxation (grade 1 diastolic dysfunction).  ------------------------------------------------------------------- Aortic valve:   Trileaflet; normal thickness leaflets. Mobility was not restricted.  Doppler:  Transvalvular velocity was within the normal range. There was no stenosis. There was no regurgitation.  ------------------------------------------------------------------- Aorta:  Aortic root: The aortic root was normal in size.  ------------------------------------------------------------------- Mitral valve:   Structurally normal valve.   Mobility was not restricted.  Doppler:  Transvalvular velocity was within the normal range. There was no evidence for stenosis. There was no regurgitation.  ------------------------------------------------------------------- Left  atrium:  The atrium was moderately dilated.  ------------------------------------------------------------------- Atrial septum:  No defect or patent foramen ovale was identified by color flow Doppler.  ------------------------------------------------------------------- Right ventricle:  The cavity size was normal. Wall thickness was normal. Systolic function was normal.  ------------------------------------------------------------------- Pulmonic valve:    Structurally normal valve.   Cusp separation was normal.  Doppler:  Transvalvular velocity was within the normal range. There was no evidence for stenosis. There was no regurgitation.  ------------------------------------------------------------------- Tricuspid valve:   Structurally normal valve.    Doppler: Transvalvular velocity was within the normal range. There was  mild regurgitation.  ------------------------------------------------------------------- Pulmonary artery:   The main pulmonary artery was normal-sized. Systolic pressure was mildly increased.  ------------------------------------------------------------------- Right atrium:  The atrium was normal in size.  ------------------------------------------------------------------- Pericardium:  There was no pericardial effusion.  ------------------------------------------------------------------- Systemic veins: Inferior vena cava: The vessel was normal in size. The respirophasic diameter changes were blunted (< 50%), consistent with elevated central venous pressure.  ------------------------------------------------------------------- Measurements  Left ventricle                            Value         Reference LV ID, ED, PLAX chordal           (H)     52.7   mm     43 - 52 LV ID, ES, PLAX chordal                   30.8   mm     23 - 38 LV fx shortening, PLAX chordal            42     %      >=29 LV PW thickness, ED                       10.8   mm     ---------- IVS/LV PW ratio, ED                       0.98          <=1.3 Stroke volume, 2D                         124    ml     ---------- Stroke volume/bsa, 2D                     52     ml/m^2 ---------- LV e&', lateral                            11.7   cm/s   ---------- LV E/e&', lateral                          5.25          ---------- LV e&', medial                             7.29   cm/s   ---------- LV E/e&', medial                           8.42          ---------- LV e&', average  9.5    cm/s   ---------- LV E/e&', average                          6.47          ----------  Ventricular septum                        Value         Reference IVS thickness, ED                         10.6   mm     ----------  LVOT                                      Value         Reference LVOT ID, S                                 26     mm     ---------- LVOT area                                 5.31   cm^2   ---------- LVOT peak velocity, S                     108    cm/s   ---------- LVOT mean velocity, S                     72.4   cm/s   ---------- LVOT VTI, S                               23.3   cm     ----------  Aorta                                     Value         Reference Aortic root ID, ED                        40     mm     ---------- Ascending aorta ID, A-P, S                33     mm     ----------  Left atrium                               Value         Reference LA ID, A-P, ES                            48     mm     ---------- LA ID/bsa, A-P                            2.01   cm/m^2 <=2.2 LA volume, S  78.8   ml     ---------- LA volume/bsa, S                          33.1   ml/m^2 ---------- LA volume, ES, 1-p A4C                    85.8   ml     ---------- LA volume/bsa, ES, 1-p A4C                36     ml/m^2 ---------- LA volume, ES, 1-p A2C                    67.7   ml     ---------- LA volume/bsa, ES, 1-p A2C                28.4   ml/m^2 ----------  Mitral valve                              Value         Reference Mitral E-wave peak velocity               61.4   cm/s   ---------- Mitral A-wave peak velocity               57.9   cm/s   ---------- Mitral deceleration time          (H)     289    ms     150 - 230 Mitral E/A ratio, peak                    1.1           ----------  Pulmonary arteries                        Value         Reference PA pressure, S, DP                (H)     38     mm Hg  <=30  Tricuspid valve                           Value         Reference Tricuspid regurg peak velocity            272.68 cm/s   ---------- Tricuspid peak RV-RA gradient             30     mm Hg  ---------- Tricuspid maximal regurg                  272.68 cm/s   ---------- velocity, PISA  Right atrium                              Value          Reference RA ID, S-I, ES, A4C               (H)     57.7   mm     34 - 49 RA area, ES, A4C  19.1   cm^2   8.3 - 19.5 RA volume, ES, A/L                        51.6   ml     ---------- RA volume/bsa, ES, A/L                    21.6   ml/m^2 ----------  Systemic veins                            Value         Reference Estimated CVP                             8      mm Hg  ----------  Right ventricle                           Value         Reference RV pressure, S, DP                (H)     38     mm Hg  <=30  Legend: (L)  and  (H)  mark values outside specified reference range.  ------------------------------------------------------------------- Prepared and Electronically Authenticated by  Chilton Si, MD 2020-01-21T18:32:16    MONITORS  CARDIAC EVENT MONITOR 05/07/2017  Narrative  Sinus bradycardia to sinus rhythm.  Normal 30 day event monitor. Sinus bradycardia predominantly at night with max pause 2.4 seconds.   CT SCANS  CT CORONARY MORPH W/CTA COR W/SCORE 08/04/2016  Addendum 08/04/2016  3:00 PM ADDENDUM REPORT: 08/04/2016 14:58  CLINICAL DATA:  42 year old male with atypical chest pain.  EXAM: Cardiac/Coronary  CT  TECHNIQUE: The patient was scanned on a Philips 256 scanner.  FINDINGS: A 120 kV prospective scan was triggered in the descending thoracic aorta at 111 HU's. Axial non-contrast 3 mm slices were carried out through the heart. The data set was analyzed on a dedicated work station and scored using the Agatson method. Gantry rotation speed was 270 msecs and collimation was .9 mm. 10 mg of iv Metoprolol and 0.8 mg of sl NTG was given. The 3D data set was reconstructed in 5% intervals of the 67-82 % of the R-R cycle. Diastolic phases were analyzed on a dedicated work station using MPR, MIP and VRT modes. The patient received 80 cc of contrast.  Aorta:  Normal size.  No calcifications.  No dissection.  Aortic Valve:   Trileaflet.  No calcifications.  Coronary Arteries:  Normal coronary origin.  Right dominance.  RCA is a large dominant artery that gives rise to PDA and PLVB. There is no plaque.  Left main is a large artery that gives rise to LAD and LCX arteries.  LAD is a large vessel that gives rise to two diagonal branches and has no plaque.  LCX is a non-dominant artery that gives rise to one large OM1 branch. There is no plaque.  Other findings:  Normal pulmonary vein drainage into the left atrium.  Normal let atrial appendage without a thrombus.  Mildly dilated pulmonary artery measuring 30 x 25 mm.  IMPRESSION: 1. Coronary calcium score of 0. This was 0 percentile for age and sex matched control.  2. Normal coronary origin with right dominance.  3. No evidence of CAD.  4. Mildly dilated pulmonary artery suggestive of pulmonary hypertension.  Tobias Alexander   Electronically Signed By: Tobias Alexander On: 08/04/2016 14:58  Narrative EXAM: OVER-READ INTERPRETATION  CT CHEST  The following report is an over-read performed by radiologist Dr. Nadara Eaton [facility] on 08/03/2016. This over-read does not include interpretation of cardiac or coronary anatomy or pathology. The coronary calcium score/coronary CTA interpretation by the cardiologist is attached.  COMPARISON:  None.  FINDINGS: Cardiovascular: Normal caliber of the thoracic aorta without dissection. Main pulmonary arteries are patent. Heart size is within normal limits. No significant pericardial effusion.  Mediastinum/Nodes: Soft tissue in the subcarinal region is prominent measuring up to 1.1 cm. Left hilar tissue is also prominent measuring 0.7 cm in short axis on sequence 504, image 19. Small amount of tissue in the anterior mediastinum probably represents residual thymic tissue. Enlarged right hilar node measures 1.0 cm in short axis on sequence 504, image 32.  Lungs/Pleura: There  appears to be a small calcified granuloma along the pleural surface of the right upper lobe on sequence 206, image 6 measuring 0.2 cm. There is a moderate-sized focus of airspace disease in the left upper lobe. Findings are suggestive for pneumonia. Pleural-based nodular structure in the anterior right upper lobe on sequence 204, image 9 may have a calcification and measures up to 0.4 cm. There are scattered small pleural-based densities and nodules. There is probably a small focus of atelectasis along the anterior right middle lobe. Cannot exclude a small nodule in the anterior right middle lobe measuring up to 0.4 cm best seen on the sagittal images, sequence 506, image 5. There is a small ground-glass focus in the superior segment of the right upper lobe on sequence 204, image 20. This could represent airspace disease. Pleural-based nodule in the left lower lobe measures 0.4 cm on sequence 204, image 47.  Upper Abdomen: Unremarkable.  Musculoskeletal: No acute bone abnormality.  IMPRESSION: Moderate-sized area of airspace disease involving the left upper lobe. There is a small focus of ground-glass disease in the superior segment of the right lower lobe. Findings are suggestive for multifocal pneumonia.  Mild mediastinal and bilateral hilar lymphadenopathy which could be reactive but nonspecific.  Multiple small pleural-based nodules, some of which are calcified. These small nodules are indeterminate. No follow-up needed if patient is low-risk (and has no known or suspected primary neoplasm). Non-contrast chest CT can be considered in 12 months if patient is high-risk. This recommendation follows the consensus statement: Guidelines for Management of Incidental Pulmonary Nodules Detected on CT Images: From the Fleischner Society 2017; Radiology 2017; 284:228-243.  These results will be called to the ordering clinician or representative by the Radiologist Assistant, and  communication documented in the PACS or zVision Dashboard.  Electronically Signed: By: Richarda Overlie M.D. On: 08/03/2016 14:32         Lab Results  Component Value Date   WBC 4.7 01/20/2020   HGB 14.4 01/20/2020   HCT 42.6 01/20/2020   MCV 93 01/20/2020   PLT 231 01/20/2020   Lab Results  Component Value Date   CREATININE 1.15 06/23/2021   BUN 16 06/23/2021   NA 141 06/23/2021   K 4.3 06/23/2021   CL 104 06/23/2021   CO2 24 06/23/2021   Lab Results  Component Value Date   ALT 13 01/20/2020   AST 12 01/20/2020   ALKPHOS 77 01/20/2020   BILITOT 0.3 01/20/2020   Lab Results  Component Value Date   CHOL  191 01/20/2020   HDL 64 01/20/2020   LDLCALC 111 (H) 01/20/2020   TRIG 86 01/20/2020   CHOLHDL 3.0 01/20/2020    Lab Results  Component Value Date   HGBA1C 5.6 01/20/2020     Assessment & Plan    1.  Chronic combined systolic and diastolic CHF: -2D echo completed 04/2018 with EF of 60 to 65% -Today patient reports no changes since previous visit and is euvolemic on examination. -Will update 2D Echo -Continue nebivolol 20 mg and as needed Lasix 40 mg, Aldactone 25 mg daily -Low sodium diet, fluid restriction <2L, and daily weights encouraged. Educated to contact our office for weight gain of 2 lbs overnight or 5 lbs in one week.   2.  Essential hypertension: -Patient's blood pressure today was well-controlled at 112/74 -Continue Aldactone 25 mg daily hydralazine 100 mg twice daily, Zestril 40 mg daily, nebivolol 20 mg daily, Norvasc 10 mg daily  3.  Hyperlipidemia: -Patient's LDL cholesterol was elevated at 96 and triglycerides were 216 -We will increase atorvastatin to 80 mg daily -Patient will have LFTs and lipids rechecked in 6 months  4.  History of OSA: -Patient reports compliance with CPAP  5.  History of TIA: -Continue GDMT with atorvastatin 80 mg daily -Patient was encouraged to increase physical activity to at least under 50  minutes/day.   Disposition: Follow-up with Tobias Alexander, MD or APP in 12 months    Medication Adjustments/Labs and Tests Ordered: Current medicines are reviewed at length with the patient today.  Concerns regarding medicines are outlined above.   Signed, Napoleon Form, Leodis Rains, NP 08/21/2022, 6:37 PM [facility]

## 2022-08-22 ENCOUNTER — Telehealth: Payer: Self-pay

## 2022-08-22 ENCOUNTER — Encounter: Payer: Self-pay | Admitting: Nurse Practitioner

## 2022-08-22 ENCOUNTER — Ambulatory Visit: Payer: BC Managed Care – PPO | Attending: Nurse Practitioner | Admitting: Nurse Practitioner

## 2022-08-22 VITALS — BP 112/74 | HR 71 | Ht 74.5 in | Wt 243.4 lb

## 2022-08-22 DIAGNOSIS — E7849 Other hyperlipidemia: Secondary | ICD-10-CM

## 2022-08-22 DIAGNOSIS — Z8673 Personal history of transient ischemic attack (TIA), and cerebral infarction without residual deficits: Secondary | ICD-10-CM

## 2022-08-22 DIAGNOSIS — I428 Other cardiomyopathies: Secondary | ICD-10-CM

## 2022-08-22 DIAGNOSIS — I5042 Chronic combined systolic (congestive) and diastolic (congestive) heart failure: Secondary | ICD-10-CM | POA: Diagnosis not present

## 2022-08-22 DIAGNOSIS — G4730 Sleep apnea, unspecified: Secondary | ICD-10-CM

## 2022-08-22 DIAGNOSIS — I10 Essential (primary) hypertension: Secondary | ICD-10-CM

## 2022-08-22 DIAGNOSIS — E1169 Type 2 diabetes mellitus with other specified complication: Secondary | ICD-10-CM

## 2022-08-22 MED ORDER — ATORVASTATIN CALCIUM 80 MG PO TABS: 80.0000 mg | ORAL_TABLET | Freq: Every day | ORAL | 1 refills | Status: DC

## 2022-08-22 NOTE — Telephone Encounter (Signed)
Patient notified directly.   Labs ordered.  Lab appt scheduled.   Patient agreeable and voiced understanding.

## 2022-08-22 NOTE — Telephone Encounter (Signed)
-----   Message from Gaston Islam., NP sent at 08/22/2022 12:30 PM EDT ----- Please contact patient and have him come in in 8 weeks for repeat LFTs and lipids.

## 2022-08-22 NOTE — Patient Instructions (Signed)
Medication Instructions:  INCREASE Atorvastatin to  Take 1 tablet once a day *If you need a refill on your cardiac medications before your next appointment, please call your pharmacy*   Lab Work: None ordered If you have labs (blood work) drawn today and your tests are completely normal, you will receive your results only by: MyChart Message (if you have MyChart) OR A paper copy in the mail If you have any lab test that is abnormal or we need to change your treatment, we will call you to review the results.   Testing/Procedures: Your physician has requested that you have an echocardiogram. Echocardiography is a painless test that uses sound waves to create images of your heart. It provides your doctor with information about the size and shape of your heart and how well your heart's chambers and valves are working. This procedure takes approximately one hour. There are no restrictions for this procedure. Please do NOT wear cologne, perfume, aftershave, or lotions (deodorant is allowed). Please arrive 15 minutes prior to your appointment time.   Follow-Up: At [facility], you and your health needs are our priority.  As part of our continuing mission to provide you with exceptional heart care, we have created designated Provider Care Teams.  These Care Teams include your primary Cardiologist (physician) and Advanced Practice Providers (APPs -  Physician Assistants and Nurse Practitioners) who all work together to provide you with the care you need, when you need it.  We recommend signing up for the patient portal called "MyChart".  Sign up information is provided on this After Visit Summary.  MyChart is used to connect with patients for Virtual Visits (Telemedicine).  Patients are able to view lab/test results, encounter notes, upcoming appointments, etc.  Non-urgent messages can be sent to your provider as well.   To learn more about what you can do with MyChart, go to  [email or web address].    Your next appointment:   12 month(s)  Provider:   Meriam Sprague, MD  Other Instructions You can get some Ted hose off [employer] or at the local medical supply company.

## 2022-09-05 DIAGNOSIS — D649 Anemia, unspecified: Secondary | ICD-10-CM | POA: Diagnosis not present

## 2022-09-05 DIAGNOSIS — R195 Other fecal abnormalities: Secondary | ICD-10-CM | POA: Diagnosis not present

## 2022-09-05 DIAGNOSIS — Z800 Family history of malignant neoplasm of digestive organs: Secondary | ICD-10-CM | POA: Diagnosis not present

## 2022-09-05 DIAGNOSIS — K279 Peptic ulcer, site unspecified, unspecified as acute or chronic, without hemorrhage or perforation: Secondary | ICD-10-CM | POA: Diagnosis not present

## 2022-09-13 DIAGNOSIS — K219 Gastro-esophageal reflux disease without esophagitis: Secondary | ICD-10-CM | POA: Diagnosis not present

## 2022-09-13 DIAGNOSIS — Z800 Family history of malignant neoplasm of digestive organs: Secondary | ICD-10-CM | POA: Diagnosis not present

## 2022-09-13 DIAGNOSIS — D649 Anemia, unspecified: Secondary | ICD-10-CM | POA: Diagnosis not present

## 2022-09-13 DIAGNOSIS — D508 Other iron deficiency anemias: Secondary | ICD-10-CM | POA: Diagnosis not present

## 2022-09-13 DIAGNOSIS — R195 Other fecal abnormalities: Secondary | ICD-10-CM | POA: Diagnosis not present

## 2022-09-13 DIAGNOSIS — K279 Peptic ulcer, site unspecified, unspecified as acute or chronic, without hemorrhage or perforation: Secondary | ICD-10-CM | POA: Diagnosis not present

## 2022-09-20 ENCOUNTER — Ambulatory Visit (HOSPITAL_COMMUNITY): Payer: BC Managed Care – PPO | Attending: Nurse Practitioner

## 2022-09-20 DIAGNOSIS — I5042 Chronic combined systolic (congestive) and diastolic (congestive) heart failure: Secondary | ICD-10-CM | POA: Insufficient documentation

## 2022-09-20 DIAGNOSIS — E1169 Type 2 diabetes mellitus with other specified complication: Secondary | ICD-10-CM | POA: Insufficient documentation

## 2022-09-20 DIAGNOSIS — G4730 Sleep apnea, unspecified: Secondary | ICD-10-CM | POA: Diagnosis not present

## 2022-09-20 DIAGNOSIS — I428 Other cardiomyopathies: Secondary | ICD-10-CM | POA: Diagnosis not present

## 2022-09-20 DIAGNOSIS — I10 Essential (primary) hypertension: Secondary | ICD-10-CM | POA: Diagnosis not present

## 2022-09-20 DIAGNOSIS — E7849 Other hyperlipidemia: Secondary | ICD-10-CM | POA: Diagnosis not present

## 2022-09-20 DIAGNOSIS — Z8673 Personal history of transient ischemic attack (TIA), and cerebral infarction without residual deficits: Secondary | ICD-10-CM | POA: Diagnosis not present

## 2022-09-20 LAB — ECHOCARDIOGRAM COMPLETE
Area-P 1/2: 3.62 cm2
S' Lateral: 3.6 cm

## 2022-09-21 ENCOUNTER — Telehealth: Payer: Self-pay | Admitting: Nurse Practitioner

## 2022-09-21 NOTE — Telephone Encounter (Signed)
Patient returning call for echo results. 

## 2022-09-25 NOTE — Telephone Encounter (Signed)
Left message for the patient to contact the office. 

## 2022-09-26 NOTE — Telephone Encounter (Signed)
Left message for the patient to contact the office. 

## 2022-09-27 NOTE — Telephone Encounter (Signed)
Left message for the patient to contact the office. 

## 2022-10-17 ENCOUNTER — Ambulatory Visit: Payer: BC Managed Care – PPO | Attending: Nurse Practitioner

## 2022-10-26 ENCOUNTER — Other Ambulatory Visit: Payer: Self-pay

## 2022-10-26 DIAGNOSIS — R079 Chest pain, unspecified: Secondary | ICD-10-CM

## 2022-10-26 DIAGNOSIS — I5042 Chronic combined systolic (congestive) and diastolic (congestive) heart failure: Secondary | ICD-10-CM

## 2022-10-26 DIAGNOSIS — I5023 Acute on chronic systolic (congestive) heart failure: Secondary | ICD-10-CM

## 2022-10-26 DIAGNOSIS — Z8673 Personal history of transient ischemic attack (TIA), and cerebral infarction without residual deficits: Secondary | ICD-10-CM

## 2022-10-26 DIAGNOSIS — E1169 Type 2 diabetes mellitus with other specified complication: Secondary | ICD-10-CM

## 2022-10-26 DIAGNOSIS — I428 Other cardiomyopathies: Secondary | ICD-10-CM

## 2022-10-26 DIAGNOSIS — R0609 Other forms of dyspnea: Secondary | ICD-10-CM

## 2022-10-26 DIAGNOSIS — I10 Essential (primary) hypertension: Secondary | ICD-10-CM

## 2022-10-26 MED ORDER — AMLODIPINE BESYLATE 10 MG PO TABS
10.0000 mg | ORAL_TABLET | Freq: Every day | ORAL | 3 refills | Status: DC
Start: 2022-10-26 — End: 2023-01-01

## 2022-10-29 ENCOUNTER — Encounter (HOSPITAL_COMMUNITY): Payer: Self-pay

## 2022-10-29 ENCOUNTER — Ambulatory Visit (INDEPENDENT_AMBULATORY_CARE_PROVIDER_SITE_OTHER): Payer: BC Managed Care – PPO

## 2022-10-29 ENCOUNTER — Ambulatory Visit (HOSPITAL_COMMUNITY)
Admission: EM | Admit: 2022-10-29 | Discharge: 2022-10-29 | Disposition: A | Discharge status: Home / Self Care | Payer: BC Managed Care – PPO | Attending: Internal Medicine | Admitting: Internal Medicine

## 2022-10-29 DIAGNOSIS — R6889 Other general symptoms and signs: Secondary | ICD-10-CM | POA: Diagnosis not present

## 2022-10-29 DIAGNOSIS — R059 Cough, unspecified: Secondary | ICD-10-CM | POA: Diagnosis not present

## 2022-10-29 DIAGNOSIS — J4521 Mild intermittent asthma with (acute) exacerbation: Secondary | ICD-10-CM | POA: Insufficient documentation

## 2022-10-29 DIAGNOSIS — R509 Fever, unspecified: Secondary | ICD-10-CM | POA: Diagnosis not present

## 2022-10-29 DIAGNOSIS — U071 COVID-19: Secondary | ICD-10-CM | POA: Insufficient documentation

## 2022-10-29 DIAGNOSIS — R531 Weakness: Secondary | ICD-10-CM | POA: Diagnosis not present

## 2022-10-29 LAB — POCT INFLUENZA A/B
Influenza A, POC: NEGATIVE
Influenza B, POC: NEGATIVE

## 2022-10-29 LAB — SARS CORONAVIRUS 2 (TAT 6-24 HRS): SARS Coronavirus 2: POSITIVE — AB

## 2022-10-29 MED ORDER — BENZONATATE 200 MG PO CAPS: 200.0000 mg | ORAL_CAPSULE | Freq: Three times a day (TID) | ORAL | 0 refills | Status: DC | PRN

## 2022-10-29 NOTE — ED Provider Notes (Addendum)
[facility]    CSN: [id number] Arrival date & time: 10/29/22  0803      History   Chief Complaint Chief Complaint  Patient presents with   Fever    HPI [name] is a 42 y.o. male who presents due to onset of of feeling hot and cold and sweating  3 nights ago and later on developed body aches, the next day started with productive coughing with clear mucous and extreme fatigue. Has been in bed all weekend. Denies HA, abdominal pain Today the rhinitis started. He tried to go to work, but was sent away. He has never had Covid. Has had 2 Covid shots. Has hx of asthma and denies more SOB than his usual with exertion    Past Medical History:  Diagnosis Date   Asthma    Chronic combined systolic and diastolic CHF (congestive heart failure) (HCC)    Diabetes mellitus without complication (HCC)    Hypertension    NICM (nonischemic cardiomyopathy) (HCC)    Normal coronary arteries    OSA (obstructive sleep apnea)    TIA (transient ischemic attack) 2014    Patient Active Problem List   Diagnosis Date Noted   Fracture of femoral head (HCC) 07/14/2018   Closed posterior wall fracture of right acetabulum (HCC) 07/14/2018   Closed dislocation of right hip (HCC) 07/14/2018   Closed right hip fracture (HCC) 07/12/2018   Hip fracture (HCC) 07/12/2018   Hyperlipidemia 07/24/2016   History of TIA (transient ischemic attack) 07/13/2016   Fever blister 06/12/2016   Gastroenteritis 06/12/2016   Mild intermittent asthma with exacerbation 06/12/2016   Tobacco use 03/26/2016   Diverticulitis of intestine without perforation or abscess without bleeding 12/20/2015   Family history of malignant neoplasm of colon in first degree relative diagnosed when younger than 42 years of age 61/22/2017   Hypertensive heart disease 08/31/2015   Sweating 08/31/2015   Sinus tarsitis of left foot 07/28/2015   Acute on chronic systolic heart failure (HCC) 09/09/2014   Sleep apnea 09/09/2014    Diabetes mellitus (HCC) 03/08/2014   DM (diabetes mellitus) (HCC) 08/20/2013   Chest pain 08/19/2013   Paresthesias 08/19/2013   DOE (dyspnea on exertion) 08/19/2013   Hypertensive urgency 09/19/2012   Pineal gland cyst 09/19/2012   Temporary cerebral vascular dysfunction 09/18/2012   Hypertension    Asthma     Past Surgical History:  Procedure Laterality Date   HIP CLOSED REDUCTION Right 07/12/2018   Procedure: CLOSED REDUCTION HIP;  Surgeon: Terance Hart, MD;  Location: [facility] OR;  Service: Orthopedics;  Laterality: Right;   INSERTION OF TRACTION PIN Right 07/12/2018   Procedure: INSERTION OF TRACTION PIN;  Surgeon: Terance Hart, MD;  Location: [facility] OR;  Service: Orthopedics;  Laterality: Right;   NO PAST SURGERIES     OPEN REDUCTION INTERNAL FIXATION ACETABULUM POSTERIOR LATERAL Right 07/14/2018   Procedure: OPEN REDUCTION INTERNAL FIXATION ACETABULUM POSTERIOR LATERAL;  Surgeon: Roby Lofts, MD;  Location: [facility] OR;  Service: Orthopedics;  Laterality: Right;       Home Medications    Prior to Admission medications   Medication Sig Start Date End Date Taking? Authorizing Provider  albuterol (VENTOLIN HFA) 108 (90 Base) MCG/ACT inhaler Inhale 2 puffs into the lungs daily as needed for wheezing or shortness of breath. 06/16/21  Yes Meriam Sprague, MD  amLODipine (NORVASC) 10 MG tablet Take 1 tablet (10 mg total) by mouth daily. 10/26/22  Yes Meriam Sprague, MD  atorvastatin (  LIPITOR) 80 MG tablet Take 1 tablet (80 mg total) by mouth daily. 08/22/22  Yes Gaston Islam., NP  Azelastine HCl 137 MCG/SPRAY SOLN Place 1 spray into the nose as needed. 06/08/21  Yes [provider]  benzonatate (TESSALON) 200 MG capsule Take 1 capsule (200 mg total) by mouth 3 (three) times daily as needed for cough. 10/29/22  Yes Rodriguez-Southworth, Nettie Elm, PA-C  furosemide (LASIX) 40 MG tablet Take 1 tablet (40 mg total) by mouth 2 (two) times daily as needed (for lower  extremity swelling). 06/16/21  Yes Meriam Sprague, MD  hydrALAZINE (APRESOLINE) 100 MG tablet Take 1 tablet (100 mg total) by mouth 2 (two) times daily. 08/20/22  Yes Meriam Sprague, MD  hydrochlorothiazide (HYDRODIURIL) 50 MG tablet Take 1 tablet by mouth daily. 08/20/22  Yes [provider]  isosorbide mononitrate (IMDUR) 120 MG 24 hr tablet Take 1 tablet (120 mg total) by mouth daily. 06/16/21  Yes Meriam Sprague, MD  lisinopril (ZESTRIL) 40 MG tablet Take 1 tablet (40 mg total) by mouth daily. 08/20/22  Yes Meriam Sprague, MD  meloxicam (MOBIC) 15 MG tablet Take 15 mg by mouth daily. 07/20/22  Yes [provider]  metFORMIN (GLUCOPHAGE) 500 MG tablet TAKE 1 TABLET BY MOUTH TWICE DAILY WITH A MEAL 02/23/20  Yes Lars Masson, MD  Multiple Vitamin (MULTIVITAMIN) tablet Take 1 tablet by mouth daily.   Yes [provider]  Nebivolol HCl (BYSTOLIC) 20 MG TABS TAKE 1 TABLET BY MOUTH DAILY AT BEDTIME 08/20/22  Yes Pemberton, Kathlynn Grate, MD  Omega-3 Fatty Acids (FISH OIL) 1000 MG CAPS Take 1,000 mg by mouth every evening.   Yes [provider]  omeprazole (PRILOSEC) 20 MG capsule Take 20 mg by mouth at bedtime. 07/20/22 07/20/23 Yes [provider]  spironolactone (ALDACTONE) 25 MG tablet Take 1 tablet (25 mg total) by mouth daily. 06/16/21  Yes Meriam Sprague, MD  albuterol (PROVENTIL) (2.5 MG/3ML) 0.083% nebulizer solution Take 2.5 mg by nebulization every 4 (four) hours as needed for wheezing or shortness of breath.    [provider]  fluticasone (FLOVENT HFA) 110 MCG/ACT inhaler Inhale 3 puffs into the lungs 3 (three) times daily. 06/16/21   Meriam Sprague, MD  glucose monitoring kit (FREESTYLE) monitoring kit 1 each by Does not apply route as needed for other. Dispense any model that is covered- dispense testing supplies for Q AC/ HS accuchecks- 1 month supply with one refil. 08/20/13   Doris Cheadle, MD   nitroGLYCERIN (NITROSTAT) 0.4 MG SL tablet Place 1 tablet (0.4 mg total) under the tongue every 5 (five) minutes as needed for chest pain. 06/16/21   Meriam Sprague, MD    Family History Family History  Problem Relation Age of Onset   Hypertension Mother    Heart disease Mother    Hypertension Father    Heart disease Father    Heart disease Paternal Uncle    Heart disease Maternal Grandmother     Social History Social History   Tobacco Use   Smoking status: Former    Packs/day: .25    Types: Cigarettes    Quit date: 06/18/2017    Years since quitting: 5.3   Smokeless tobacco: Never  Vaping Use   Vaping Use: Never used  Substance Use Topics   Alcohol use: Yes    Comment: 3 beers and a shot per day- last use yesterday   Drug use: No  Allergies   Naproxen sodium   Review of Systems Review of Systems  Constitutional:  Positive for activity change, appetite change, chills, diaphoresis, fatigue and fever.  HENT:  Positive for rhinorrhea. Negative for congestion, ear discharge, ear pain, sore throat and trouble swallowing.   Eyes:  Negative for discharge.  Respiratory:  Positive for cough. Negative for chest tightness and shortness of breath.   Gastrointestinal:  Negative for abdominal distention, diarrhea, nausea and vomiting.  Genitourinary:  Negative for dysuria.  Musculoskeletal:  Positive for myalgias.  Skin:  Negative for rash.  Neurological:  Negative for headaches.  Hematological:  Negative for adenopathy.     Physical Exam Triage Vital Signs ED Triage Vitals  Enc Vitals Group     BP 10/29/22 0841 130/89     Pulse Rate 10/29/22 0841 77     Resp 10/29/22 0841 16     Temp 10/29/22 0841 99.3 F (37.4 C)     Temp Source 10/29/22 0841 Oral     SpO2 10/29/22 0841 94 %     Weight 10/29/22 0841 237 lb (107.5 kg)     Height 10/29/22 0841 6\' 2"  (1.88 m)     Head Circumference --      Peak Flow --      Pain Score 10/29/22 0840 8     Pain Loc --       Pain Edu? --      Excl. in GC? --    No data found.  Updated Vital Signs BP 130/89 (BP Location: Left Arm)   Pulse 77   Temp 99.3 F (37.4 C) (Oral)   Resp 16   Ht 6\' 2"  (1.88 m)   Wt 237 lb (107.5 kg)   SpO2 94%   BMI 30.43 kg/m   Visual Acuity Right Eye Distance:   Left Eye Distance:   Bilateral Distance:    Right Eye Near:   Left Eye Near:    Bilateral Near:     Physical Exam Vitals and nursing note reviewed.  Constitutional:      General: He is not in acute distress.    Appearance: He is ill-appearing. He is not toxic-appearing or diaphoretic.  HENT:     Right Ear: Tympanic membrane, ear canal and external ear normal.     Left Ear: Tympanic membrane, ear canal and external ear normal.     Nose: Rhinorrhea present.     Mouth/Throat:     Mouth: Mucous membranes are moist.     Pharynx: Oropharynx is clear.  Eyes:     General: No scleral icterus.    Conjunctiva/sclera: Conjunctivae normal.  Cardiovascular:     Rate and Rhythm: Normal rate and regular rhythm.     Heart sounds: No murmur heard. Pulmonary:     Effort: Pulmonary effort is normal.     Breath sounds: Normal breath sounds.  Abdominal:     General: Bowel sounds are normal.     Palpations: Abdomen is soft. There is no mass.     Tenderness: There is no abdominal tenderness. There is no guarding or rebound.  Musculoskeletal:        General: Normal range of motion.     Cervical back: Neck supple. No rigidity or tenderness.  Lymphadenopathy:     Cervical: No cervical adenopathy.  Skin:    General: Skin is warm and dry.     Findings: No rash.  Neurological:     Mental Status: He is alert and oriented to person,  place, and time.     Gait: Gait normal.  Psychiatric:        Mood and Affect: Mood normal.        Behavior: Behavior normal.        Thought Content: Thought content normal.        Judgment: Judgment normal.      UC Treatments / Results  Labs (all labs ordered are listed, but only  abnormal results are displayed) Labs Reviewed  SARS CORONAVIRUS 2 (TAT 6-24 HRS)  POCT INFLUENZA A/B  Flu A&B negative  EKG   Radiology DG Chest 2 View  Result Date: 10/29/2022 CLINICAL DATA:  Several day history of fevers, chills, cough, and weakness EXAM: CHEST - 2 VIEW COMPARISON:  Chest radiograph dated 05/15/2018 FINDINGS: Normal lung volumes. Similar left mid lung linear opacities. No focal consolidations. No pleural effusion or pneumothorax. The heart size and mediastinal contours are within normal limits. No acute osseous abnormality. IMPRESSION: No active cardiopulmonary disease. Electronically Signed   By: Agustin Cree M.D.   On: 10/29/2022 09:20    Procedures Procedures (including critical care time)  Medications Ordered in UC Medications - No data to display  Initial Impression / Assessment and Plan / UC Course  I have reviewed the triage vital signs and the nursing notes. Pertinent labs & imaging results that were available during my care of the patient were reviewed by me and considered in my medical decision making (see chart for details).  Flu like symptoms  I placed him on Tessalon prn cough We will call him if the Covid test is positive. If so due to asthma and heart failure we can call him in Paxolvid. His Creatinine and eGFR were normal in March. See instructions.    Final Clinical Impressions(s) / UC Diagnoses   Final diagnoses:  Flu-like symptoms     Discharge Instructions      Your flu tests and chest xray are normal. If could be some other type of virus or Covid causing your symptoms.  We will call you when the Covid test is back. Due to your history of Asthma and heart condition, if the Covid test is positive we will place you on the Covid antiviral as long as is within 5 days from onset of your symptoms.  I have sent something for cough and take it as needed, as well as use your inhaler as needed for cough. You may Take Tylenol as directed in the box  for body aches  If the Covid test is positive, you need to stay quarantined until Wednesday, and may return to work on Thursday this week, but you need to wear a mask for 5 more day.      ED Prescriptions     Medication Sig Dispense Auth. Provider   benzonatate (TESSALON) 200 MG capsule Take 1 capsule (200 mg total) by mouth 3 (three) times daily as needed for cough. 30 capsule Rodriguez-Southworth, Nettie Elm, PA-C      PDMP not reviewed this encounter.   Garey Ham, PA-C 10/29/22 0934    Rodriguez-Southworth, Nettie Elm, PA-C 10/29/22 [phone]

## 2022-10-29 NOTE — Discharge Instructions (Addendum)
Your flu tests and chest xray are normal. If could be some other type of virus or Covid causing your symptoms.  We will call you when the Covid test is back. Due to your history of Asthma and heart condition, if the Covid test is positive we will place you on the Covid antiviral as long as is within 5 days from onset of your symptoms.  I have sent something for cough and take it as needed, as well as use your inhaler as needed for cough. You may Take Tylenol as directed in the box for body aches  If the Covid test is positive, you need to stay quarantined until Wednesday, and may return to work on Thursday this week, but you need to wear a mask for 5 more day.

## 2022-10-29 NOTE — ED Triage Notes (Signed)
Patient here today with c/o fever, chills, sweats, body aches, cough, and weakness since Friday. No sick contacts. No recent travel.

## 2022-10-30 ENCOUNTER — Telehealth (HOSPITAL_COMMUNITY): Payer: Self-pay | Admitting: Emergency Medicine

## 2022-10-30 MED ORDER — NIRMATRELVIR/RITONAVIR (PAXLOVID)TABLET: 3.0000 | ORAL_TABLET | Freq: Two times a day (BID) | ORAL | 0 refills | Status: AC

## 2022-10-30 NOTE — Telephone Encounter (Signed)
Paxlovid for positive COVID

## 2023-01-01 ENCOUNTER — Other Ambulatory Visit: Payer: Self-pay

## 2023-01-01 DIAGNOSIS — Z131 Encounter for screening for diabetes mellitus: Secondary | ICD-10-CM | POA: Diagnosis not present

## 2023-01-01 DIAGNOSIS — E1169 Type 2 diabetes mellitus with other specified complication: Secondary | ICD-10-CM

## 2023-01-01 DIAGNOSIS — K219 Gastro-esophageal reflux disease without esophagitis: Secondary | ICD-10-CM | POA: Diagnosis not present

## 2023-01-01 DIAGNOSIS — E119 Type 2 diabetes mellitus without complications: Secondary | ICD-10-CM

## 2023-01-01 DIAGNOSIS — I5042 Chronic combined systolic (congestive) and diastolic (congestive) heart failure: Secondary | ICD-10-CM

## 2023-01-01 DIAGNOSIS — R079 Chest pain, unspecified: Secondary | ICD-10-CM

## 2023-01-01 DIAGNOSIS — I10 Essential (primary) hypertension: Secondary | ICD-10-CM

## 2023-01-01 DIAGNOSIS — R0609 Other forms of dyspnea: Secondary | ICD-10-CM

## 2023-01-01 DIAGNOSIS — I428 Other cardiomyopathies: Secondary | ICD-10-CM

## 2023-01-01 DIAGNOSIS — I5023 Acute on chronic systolic (congestive) heart failure: Secondary | ICD-10-CM

## 2023-01-01 DIAGNOSIS — Z8673 Personal history of transient ischemic attack (TIA), and cerebral infarction without residual deficits: Secondary | ICD-10-CM

## 2023-01-01 MED ORDER — ATORVASTATIN CALCIUM 80 MG PO TABS: 80.0000 mg | ORAL_TABLET | Freq: Every day | ORAL | 11 refills | Status: DC

## 2023-01-01 MED ORDER — HYDROCHLOROTHIAZIDE 50 MG PO TABS: 50.0000 mg | ORAL_TABLET | Freq: Every day | ORAL | 11 refills | Status: DC

## 2023-01-01 MED ORDER — FUROSEMIDE 40 MG PO TABS
40.0000 mg | ORAL_TABLET | Freq: Two times a day (BID) | ORAL | 4 refills | Status: DC | PRN
Start: 2023-01-01 — End: 2023-05-21

## 2023-01-01 MED ORDER — LISINOPRIL 40 MG PO TABS
40.0000 mg | ORAL_TABLET | Freq: Every day | ORAL | 11 refills | Status: DC
Start: 2023-01-01 — End: 2023-07-31

## 2023-01-01 MED ORDER — ISOSORBIDE MONONITRATE ER 120 MG PO TB24
120.0000 mg | ORAL_TABLET | Freq: Every day | ORAL | 11 refills | Status: DC
Start: 2023-01-01 — End: 2023-07-31

## 2023-01-01 MED ORDER — HYDRALAZINE HCL 100 MG PO TABS
100.0000 mg | ORAL_TABLET | Freq: Two times a day (BID) | ORAL | 11 refills | Status: DC
Start: 2023-01-01 — End: 2023-07-31

## 2023-01-01 MED ORDER — SPIRONOLACTONE 25 MG PO TABS
25.0000 mg | ORAL_TABLET | Freq: Every day | ORAL | 11 refills | Status: DC
Start: 2023-01-01 — End: 2023-07-31

## 2023-01-01 MED ORDER — AMLODIPINE BESYLATE 10 MG PO TABS
10.0000 mg | ORAL_TABLET | Freq: Every day | ORAL | 11 refills | Status: DC
Start: 2023-01-01 — End: 2023-07-31

## 2023-01-01 MED ORDER — NEBIVOLOL HCL 20 MG PO TABS
ORAL_TABLET | ORAL | 11 refills | Status: DC
Start: 2023-01-01 — End: 2023-07-31

## 2023-02-06 ENCOUNTER — Ambulatory Visit (HOSPITAL_COMMUNITY)
Admission: EM | Admit: 2023-02-06 | Discharge: 2023-02-06 | Disposition: A | Discharge status: Home / Self Care | Payer: BC Managed Care – PPO | Attending: Emergency Medicine | Admitting: Emergency Medicine

## 2023-02-06 DIAGNOSIS — E1165 Type 2 diabetes mellitus with hyperglycemia: Secondary | ICD-10-CM | POA: Insufficient documentation

## 2023-02-06 DIAGNOSIS — R739 Hyperglycemia, unspecified: Secondary | ICD-10-CM | POA: Diagnosis not present

## 2023-02-06 LAB — CBC WITH DIFFERENTIAL/PLATELET
Abs Immature Granulocytes: 0.03 10*3/uL (ref 0.00–0.07)
Basophils Absolute: 0 10*3/uL (ref 0.0–0.1)
Basophils Relative: 1 %
Eosinophils Absolute: 0.1 10*3/uL (ref 0.0–0.5)
Eosinophils Relative: 1 %
HCT: 41.4 % (ref 39.0–52.0)
Hemoglobin: 14.2 g/dL (ref 13.0–17.0)
Immature Granulocytes: 0 %
Lymphocytes Relative: 32 %
Lymphs Abs: 2.1 10*3/uL (ref 0.7–4.0)
MCH: 30.7 pg (ref 26.0–34.0)
MCHC: 34.3 g/dL (ref 30.0–36.0)
MCV: 89.6 fL (ref 80.0–100.0)
Monocytes Absolute: 0.5 10*3/uL (ref 0.1–1.0)
Monocytes Relative: 8 %
Neutro Abs: 3.9 10*3/uL (ref 1.7–7.7)
Neutrophils Relative %: 58 %
Platelets: 195 10*3/uL (ref 150–400)
RBC: 4.62 MIL/uL (ref 4.22–5.81)
RDW: 11.4 % — ABNORMAL LOW (ref 11.5–15.5)
WBC: 6.7 10*3/uL (ref 4.0–10.5)
nRBC: 0 % (ref 0.0–0.2)

## 2023-02-06 LAB — POCT URINALYSIS DIP (MANUAL ENTRY)
Bilirubin, UA: NEGATIVE
Blood, UA: NEGATIVE
Glucose, UA: >=1000 mg/dL — AB
Ketones, POC UA: NEGATIVE mg/dL
Leukocytes, UA: NEGATIVE
Nitrite, UA: NEGATIVE
Protein Ur, POC: NEGATIVE mg/dL
Spec Grav, UA: 1.01 (ref 1.010–1.025)
Urobilinogen, UA: 0.2 U/dL
pH, UA: 5 (ref 5.0–8.0)

## 2023-02-06 LAB — POCT FASTING CBG KUC MANUAL ENTRY: POCT Glucose (KUC): 406 mg/dL — AB (ref 70–99)

## 2023-02-06 MED ORDER — GLUCOSE BLOOD VI STRP: ORAL_STRIP | 12 refills | Status: AC

## 2023-02-06 MED ORDER — ACCU-CHEK SOFTCLIX LANCETS MISC: 12 refills | Status: AC

## 2023-02-06 NOTE — ED Triage Notes (Signed)
Pt reports his blood sugars have been elevated. Ranging from 302-400.

## 2023-02-06 NOTE — ED Provider Notes (Signed)
[facility]    CSN: [id number] Arrival date & time: 02/06/23  1609      History   Chief Complaint Chief Complaint  Patient presents with   Hyperglycemia    HPI Joseph Shannon is a 42 y.o. male.   Patient presents to clinic for complaint of hyperglycemia, increased thirst and increased urination.  He was recently started back on metformin by his primary care, has been taking 500 mg once daily.  He has also been checking his CBGs 3 times daily.  In the mornings prior to work they are in the 120s.  After breakfast it is high 180s to 190s.  At night prior to bed it is 180s to 190s.  He does not drink sodas.  Drinks black coffee.  He has recently started eating potatoes.  Does not eat rice, no Posta.  Reports he did take an extra metformin prior to arrival in attempt to lower his blood sugar.  At work he checked it and it was over 600, checked it again once he got home and it was in the 400s.  In clinic it is 406.   The history is provided by the patient and medical records.  Hyperglycemia Associated symptoms: fatigue, increased thirst and polyuria   Associated symptoms: no abdominal pain, no dysuria and no fever     Past Medical History:  Diagnosis Date   Asthma    Chronic combined systolic and diastolic CHF (congestive heart failure) (HCC)    Diabetes mellitus without complication (HCC)    Hypertension    NICM (nonischemic cardiomyopathy) (HCC)    Normal coronary arteries    OSA (obstructive sleep apnea)    TIA (transient ischemic attack) 2014    Patient Active Problem List   Diagnosis Date Noted   Fracture of femoral head (HCC) 07/14/2018   Closed posterior wall fracture of right acetabulum (HCC) 07/14/2018   Closed dislocation of right hip (HCC) 07/14/2018   Closed right hip fracture (HCC) 07/12/2018   Hip fracture (HCC) 07/12/2018   Hyperlipidemia 07/24/2016   History of TIA (transient ischemic attack) 07/13/2016   Fever blister 06/12/2016    Gastroenteritis 06/12/2016   Mild intermittent asthma with exacerbation 06/12/2016   Tobacco use 03/26/2016   Diverticulitis of intestine without perforation or abscess without bleeding 12/20/2015   Family history of malignant neoplasm of colon in first degree relative diagnosed when younger than 42 years of age 29/22/2017   Hypertensive heart disease 08/31/2015   Sweating 08/31/2015   Sinus tarsitis of left foot 07/28/2015   Acute on chronic systolic heart failure (HCC) 09/09/2014   Sleep apnea 09/09/2014   Diabetes mellitus (HCC) 03/08/2014   DM (diabetes mellitus) (HCC) 08/20/2013   Chest pain 08/19/2013   Paresthesias 08/19/2013   DOE (dyspnea on exertion) 08/19/2013   Hypertensive urgency 09/19/2012   Pineal gland cyst 09/19/2012   Temporary cerebral vascular dysfunction 09/18/2012   Hypertension    Asthma     Past Surgical History:  Procedure Laterality Date   HIP CLOSED REDUCTION Right 07/12/2018   Procedure: CLOSED REDUCTION HIP;  Surgeon: Terance Hart, MD;  Location: [facility] OR;  Service: Orthopedics;  Laterality: Right;   INSERTION OF TRACTION PIN Right 07/12/2018   Procedure: INSERTION OF TRACTION PIN;  Surgeon: Terance Hart, MD;  Location: [facility] OR;  Service: Orthopedics;  Laterality: Right;   NO PAST SURGERIES     OPEN REDUCTION INTERNAL FIXATION ACETABULUM POSTERIOR LATERAL Right 07/14/2018   Procedure: OPEN REDUCTION INTERNAL FIXATION  ACETABULUM POSTERIOR LATERAL;  Surgeon: Roby Lofts, MD;  Location: [facility] OR;  Service: Orthopedics;  Laterality: Right;       Home Medications    Prior to Admission medications   Medication Sig Start Date End Date Taking? Authorizing Provider  Accu-Chek Softclix Lancets lancets Use as instructed 02/06/23  Yes Rinaldo Ratel, [place] N, FNP  amLODipine (NORVASC) 10 MG tablet Take 1 tablet (10 mg total) by mouth daily. 01/01/23  Yes Gaston Islam., NP  atorvastatin (LIPITOR) 80 MG tablet Take 1 tablet (80 mg total) by mouth  daily. 01/01/23  Yes Gaston Islam., NP  furosemide (LASIX) 40 MG tablet Take 1 tablet (40 mg total) by mouth 2 (two) times daily as needed (for lower extremity swelling). 01/01/23  Yes Gaston Islam., NP  glucose monitoring kit (FREESTYLE) monitoring kit 1 each by Does not apply route as needed for other. Dispense any model that is covered- dispense testing supplies for Q AC/ HS accuchecks- 1 month supply with one refil. 08/20/13  Yes Advani, Ayesha Rumpf, MD  hydrochlorothiazide (HYDRODIURIL) 50 MG tablet Take 1 tablet (50 mg total) by mouth daily. 01/01/23  Yes Gaston Islam., NP  isosorbide mononitrate (IMDUR) 120 MG 24 hr tablet Take 1 tablet (120 mg total) by mouth daily. 01/01/23  Yes Gaston Islam., NP  lisinopril (ZESTRIL) 40 MG tablet Take 1 tablet (40 mg total) by mouth daily. 01/01/23  Yes Gaston Islam., NP  metFORMIN (GLUCOPHAGE) 500 MG tablet TAKE 1 TABLET BY MOUTH TWICE DAILY WITH A MEAL 02/23/20  Yes Lars Masson, MD  Multiple Vitamin (MULTIVITAMIN) tablet Take 1 tablet by mouth daily.   Yes [provider]  Nebivolol HCl (BYSTOLIC) 20 MG TABS TAKE 1 TABLET BY MOUTH DAILY AT BEDTIME 01/01/23  Yes Gaston Islam., NP  Omega-3 Fatty Acids (FISH OIL) 1000 MG CAPS Take 1,000 mg by mouth every evening.   Yes [provider]  omeprazole (PRILOSEC) 20 MG capsule Take 20 mg by mouth at bedtime. 07/20/22 07/20/23 Yes [provider]  spironolactone (ALDACTONE) 25 MG tablet Take 1 tablet (25 mg total) by mouth daily. 01/01/23  Yes Gaston Islam., NP  albuterol (PROVENTIL) (2.5 MG/3ML) 0.083% nebulizer solution Take 2.5 mg by nebulization every 4 (four) hours as needed for wheezing or shortness of breath.    [provider]  albuterol (VENTOLIN HFA) 108 (90 Base) MCG/ACT inhaler Inhale 2 puffs into the lungs daily as needed for wheezing or shortness of breath. 06/16/21   Meriam Sprague, MD  fluticasone (FLOVENT HFA) 110 MCG/ACT inhaler Inhale 3  puffs into the lungs 3 (three) times daily. 06/16/21   Meriam Sprague, MD  hydrALAZINE (APRESOLINE) 100 MG tablet Take 1 tablet (100 mg total) by mouth 2 (two) times daily. 01/01/23   Gaston Islam., NP  nitroGLYCERIN (NITROSTAT) 0.4 MG SL tablet Place 1 tablet (0.4 mg total) under the tongue every 5 (five) minutes as needed for chest pain. 06/16/21   Meriam Sprague, MD    Family History Family History  Problem Relation Age of Onset   Hypertension Mother    Heart disease Mother    Hypertension Father    Heart disease Father    Heart disease Paternal Uncle    Heart disease Maternal Grandmother     Social History Social History   Tobacco Use   Smoking status: Former    Current packs/day: 0.00  Types: Cigarettes    Quit date: 06/18/2017    Years since quitting: 5.6   Smokeless tobacco: Never  Vaping Use   Vaping status: Never Used  Substance Use Topics   Alcohol use: Yes    Comment: 3 beers and a shot per day- last use yesterday   Drug use: No     Allergies   Naproxen sodium   Review of Systems Review of Systems  Constitutional:  Positive for fatigue. Negative for fever.  Gastrointestinal:  Negative for abdominal pain.  Endocrine: Positive for polydipsia and polyuria.  Genitourinary:  Positive for frequency. Negative for dysuria.     Physical Exam Triage Vital Signs ED Triage Vitals  Encounter Vitals Group     BP 02/06/23 1659 128/78     Systolic BP Percentile --      Diastolic BP Percentile --      Pulse Rate 02/06/23 1659 90     Resp 02/06/23 1659 16     Temp 02/06/23 1659 98.3 F (36.8 C)     Temp Source 02/06/23 1659 Oral     SpO2 02/06/23 1659 96 %     Weight --      Height --      Head Circumference --      Peak Flow --      Pain Score 02/06/23 1701 0     Pain Loc --      Pain Education --      Exclude from Growth Chart --    No data found.  Updated Vital Signs BP 128/78 (BP Location: Left Arm)   Pulse 90   Temp 98.3 F (36.8  C) (Oral)   Resp 16   SpO2 96%   Visual Acuity Right Eye Distance:   Left Eye Distance:   Bilateral Distance:    Right Eye Near:   Left Eye Near:    Bilateral Near:     Physical Exam Vitals and nursing note reviewed.  Constitutional:      Appearance: Normal appearance.  HENT:     Head: Normocephalic and atraumatic.     Right Ear: External ear normal.     Left Ear: External ear normal.     Nose: Nose normal.     Mouth/Throat:     Mouth: Mucous membranes are moist.  Eyes:     Conjunctiva/sclera: Conjunctivae normal.  Cardiovascular:     Rate and Rhythm: Normal rate.  Pulmonary:     Effort: Pulmonary effort is normal. No respiratory distress.  Musculoskeletal:        General: Normal range of motion.  Skin:    General: Skin is warm and dry.  Neurological:     General: No focal deficit present.     Mental Status: He is alert and oriented to person, place, and time.  Psychiatric:        Mood and Affect: Mood normal.        Behavior: Behavior normal. Behavior is cooperative.      UC Treatments / Results  Labs (all labs ordered are listed, but only abnormal results are displayed) Labs Reviewed  POCT URINALYSIS DIP (MANUAL ENTRY) - Abnormal; Notable for the following components:      Result Value   Clarity, UA clear (*)    Glucose, UA >=1,000 (*)    All other components within normal limits  POCT FASTING CBG [facility] MANUAL ENTRY - Abnormal; Notable for the following components:   POCT Glucose ([facility]) 406 (*)  All other components within normal limits  COMPREHENSIVE METABOLIC PANEL  CBC WITH DIFFERENTIAL/PLATELET    EKG   Radiology No results found.  Procedures Procedures (including critical care time)  Medications Ordered in UC Medications - No data to display  Initial Impression / Assessment and Plan / UC Course  I have reviewed the triage vital signs and the nursing notes.  Pertinent labs & imaging results that were available during my care of the  patient were reviewed by me and considered in my medical decision making (see chart for details).  Vitals and triage reviewed, patient is hemodynamically stable.  Hyperglycemic at 406, nonfasting.  Urinalysis shows glucose of over thousand.  Negative for ketones, protein, red blood cells or leukocytes.  Will check CBC and CMP, evaluating for metabolic function and anion gap.  Strict emergency precautions given if CBG over 600 again, or anion gap is large.  Will increase metformin, strict PCP follow-up.  Plan of care, follow-up care and return precautions discussed, no questions at this time.     Final Clinical Impressions(s) / UC Diagnoses   Final diagnoses:  Hyperglycemia  Type 2 diabetes mellitus with hyperglycemia, without long-term current use of insulin (HCC)     Discharge Instructions      Please increase your metformin to 500 mg twice daily.  If your labs do not require emergent follow-up, then please follow-up with your primary care over the next day or 2.  Please head to the nearest emergency department if you get another glucose reading over 600.  I will contact you if her anion gap is elevated to seek further care at the nearest emergency department, as you may need IV insulin and fluids.  Return to clinic for any new or urgent symptoms.     ED Prescriptions     Medication Sig Dispense Auth. Provider   Accu-Chek Softclix Lancets lancets Use as instructed 100 each Schyler Counsell, [place] N, FNP      PDMP not reviewed this encounter.   Savilla Turbyfill, [place] N, [place] 02/06/23 [phone]

## 2023-02-06 NOTE — Discharge Instructions (Addendum)
Please increase your metformin to 500 mg twice daily.  If your labs do not require emergent follow-up, then please follow-up with your primary care over the next day or 2.  Please head to the nearest emergency department if you get another glucose reading over 600.  I will contact you if her anion gap is elevated to seek further care at the nearest emergency department, as you may need IV insulin and fluids.  Return to clinic for any new or urgent symptoms.

## 2023-02-07 DIAGNOSIS — E1165 Type 2 diabetes mellitus with hyperglycemia: Secondary | ICD-10-CM | POA: Diagnosis not present

## 2023-02-07 DIAGNOSIS — Z7984 Long term (current) use of oral hypoglycemic drugs: Secondary | ICD-10-CM | POA: Diagnosis not present

## 2023-02-07 DIAGNOSIS — R000 Tachycardia, unspecified: Secondary | ICD-10-CM | POA: Diagnosis not present

## 2023-02-28 ENCOUNTER — Telehealth: Payer: Self-pay | Admitting: Cardiology

## 2023-02-28 NOTE — Telephone Encounter (Signed)
Patient is requesting FMLA for intermittent leave for when he is sick and for when he comes in for appointments. Advised that we only see him once a year and that is all we can excuse him from. He will need to reach out to his PCP. Patient verbalized understanding.

## 2023-02-28 NOTE — Telephone Encounter (Signed)
Patient calling in with questions concerning filling out his FMLA paperwork. Please advise

## 2023-04-05 DIAGNOSIS — E1165 Type 2 diabetes mellitus with hyperglycemia: Secondary | ICD-10-CM | POA: Diagnosis not present

## 2023-05-20 ENCOUNTER — Other Ambulatory Visit: Payer: Self-pay | Admitting: Nurse Practitioner

## 2023-05-20 DIAGNOSIS — E1169 Type 2 diabetes mellitus with other specified complication: Secondary | ICD-10-CM

## 2023-05-20 DIAGNOSIS — I428 Other cardiomyopathies: Secondary | ICD-10-CM

## 2023-05-20 DIAGNOSIS — I5023 Acute on chronic systolic (congestive) heart failure: Secondary | ICD-10-CM

## 2023-05-20 DIAGNOSIS — R079 Chest pain, unspecified: Secondary | ICD-10-CM

## 2023-05-20 DIAGNOSIS — R0609 Other forms of dyspnea: Secondary | ICD-10-CM

## 2023-05-20 DIAGNOSIS — I5042 Chronic combined systolic (congestive) and diastolic (congestive) heart failure: Secondary | ICD-10-CM

## 2023-05-20 DIAGNOSIS — I10 Essential (primary) hypertension: Secondary | ICD-10-CM

## 2023-05-20 DIAGNOSIS — Z8673 Personal history of transient ischemic attack (TIA), and cerebral infarction without residual deficits: Secondary | ICD-10-CM

## 2023-07-23 DIAGNOSIS — R531 Weakness: Secondary | ICD-10-CM | POA: Diagnosis not present

## 2023-07-23 DIAGNOSIS — E1165 Type 2 diabetes mellitus with hyperglycemia: Secondary | ICD-10-CM | POA: Diagnosis not present

## 2023-07-23 DIAGNOSIS — R5383 Other fatigue: Secondary | ICD-10-CM | POA: Diagnosis not present

## 2023-07-29 DIAGNOSIS — E1165 Type 2 diabetes mellitus with hyperglycemia: Secondary | ICD-10-CM | POA: Diagnosis not present

## 2023-07-29 DIAGNOSIS — R790 Abnormal level of blood mineral: Secondary | ICD-10-CM | POA: Diagnosis not present

## 2023-07-29 DIAGNOSIS — R5383 Other fatigue: Secondary | ICD-10-CM | POA: Diagnosis not present

## 2023-07-29 DIAGNOSIS — N1831 Chronic kidney disease, stage 3a: Secondary | ICD-10-CM | POA: Diagnosis not present

## 2023-07-29 DIAGNOSIS — R944 Abnormal results of kidney function studies: Secondary | ICD-10-CM | POA: Diagnosis not present

## 2023-07-31 ENCOUNTER — Encounter: Payer: Self-pay | Admitting: *Deleted

## 2023-07-31 ENCOUNTER — Ambulatory Visit: Attending: Cardiology | Admitting: Cardiology

## 2023-07-31 ENCOUNTER — Telehealth: Payer: Self-pay | Admitting: *Deleted

## 2023-07-31 ENCOUNTER — Encounter: Payer: Self-pay | Admitting: Cardiology

## 2023-07-31 VITALS — BP 122/90 | HR 73 | Resp 16 | Ht 74.0 in | Wt 239.4 lb

## 2023-07-31 DIAGNOSIS — I10 Essential (primary) hypertension: Secondary | ICD-10-CM

## 2023-07-31 DIAGNOSIS — I428 Other cardiomyopathies: Secondary | ICD-10-CM | POA: Insufficient documentation

## 2023-07-31 DIAGNOSIS — I5023 Acute on chronic systolic (congestive) heart failure: Secondary | ICD-10-CM | POA: Diagnosis not present

## 2023-07-31 DIAGNOSIS — Z8673 Personal history of transient ischemic attack (TIA), and cerebral infarction without residual deficits: Secondary | ICD-10-CM

## 2023-07-31 DIAGNOSIS — R0609 Other forms of dyspnea: Secondary | ICD-10-CM | POA: Diagnosis not present

## 2023-07-31 DIAGNOSIS — G4730 Sleep apnea, unspecified: Secondary | ICD-10-CM

## 2023-07-31 DIAGNOSIS — I5042 Chronic combined systolic (congestive) and diastolic (congestive) heart failure: Secondary | ICD-10-CM

## 2023-07-31 DIAGNOSIS — E1169 Type 2 diabetes mellitus with other specified complication: Secondary | ICD-10-CM

## 2023-07-31 DIAGNOSIS — E782 Mixed hyperlipidemia: Secondary | ICD-10-CM | POA: Diagnosis not present

## 2023-07-31 DIAGNOSIS — E7849 Other hyperlipidemia: Secondary | ICD-10-CM

## 2023-07-31 DIAGNOSIS — R079 Chest pain, unspecified: Secondary | ICD-10-CM

## 2023-07-31 MED ORDER — HYDRALAZINE HCL 100 MG PO TABS
100.0000 mg | ORAL_TABLET | Freq: Two times a day (BID) | ORAL | 1 refills | Status: DC
Start: 2023-07-31 — End: 2023-10-07

## 2023-07-31 MED ORDER — ASPIRIN 81 MG PO TBEC: 81.0000 mg | DELAYED_RELEASE_TABLET | Freq: Every day | ORAL | 6 refills | Status: AC

## 2023-07-31 MED ORDER — AMLODIPINE BESYLATE 10 MG PO TABS
10.0000 mg | ORAL_TABLET | Freq: Every day | ORAL | 1 refills | Status: DC
Start: 2023-07-31 — End: 2023-10-07

## 2023-07-31 MED ORDER — NEBIVOLOL HCL 20 MG PO TABS: ORAL_TABLET | ORAL | 1 refills | Status: DC

## 2023-07-31 MED ORDER — SPIRONOLACTONE 25 MG PO TABS: 25.0000 mg | ORAL_TABLET | Freq: Every day | ORAL | 1 refills | Status: DC

## 2023-07-31 MED ORDER — ATORVASTATIN CALCIUM 80 MG PO TABS
80.0000 mg | ORAL_TABLET | Freq: Every day | ORAL | 1 refills | Status: DC
Start: 2023-07-31 — End: 2023-10-07

## 2023-07-31 MED ORDER — LISINOPRIL 40 MG PO TABS
40.0000 mg | ORAL_TABLET | Freq: Every day | ORAL | 1 refills | Status: DC
Start: 2023-07-31 — End: 2023-10-07

## 2023-07-31 MED ORDER — HYDROCHLOROTHIAZIDE 50 MG PO TABS
50.0000 mg | ORAL_TABLET | Freq: Every day | ORAL | 11 refills | Status: DC
Start: 2023-07-31 — End: 2023-10-07

## 2023-07-31 NOTE — Patient Instructions (Signed)
 Medication Instructions:   STOP TAKING ISOSORBIDE MONONITRATE (IMDUR) NOW  STOP TAKING FUROSEMIDE (LASIX) NOW  START TAKING ASPIRIN 81 MG BY MOUTH DAILY  *If you need a refill on your cardiac medications before your next appointment, please call your pharmacy*   Lab Work:  TODAY DOWNSTAIRS FIRST FLOOR AT [facility] (CLOSED FROM 12 PM-1 PM)--LIPIDS, TSH, RENIN/ALDOSTERONE LEVEL  If you have labs (blood work) drawn today and your tests are completely normal, you will receive your results only by: MyChart Message (if you have MyChart) OR A paper copy in the mail If you have any lab test that is abnormal or we need to change your treatment, we will call you to review the results.   Testing/Procedures:  Your physician has requested that you have a renal artery duplex. During this test, an ultrasound is used to evaluate blood flow to the kidneys. Allow one hour for this exam. Do not eat after midnight the day before and avoid carbonated beverages. Take your medications as you usually do.    Your physician has recommended that you have a ITAMAR sleep study. This test records several body functions during sleep, including: brain activity, eye movement, oxygen and carbon dioxide blood levels, heart rate and rhythm, breathing rate and rhythm, the flow of air through your mouth and nose, snoring, body muscle movements, and chest and belly movement. YOU WILL BE PROVIDED THIS DEVICE IN THE CLINIC TODAY AND INSTRUCTIONS ON HOW TO USE THE DEVICE   Follow-Up:  3 MONTHS WITH DR. PATWARDHAN        1st Floor: - Lobby - Registration  - Pharmacy  - Lab - Cafe  2nd Floor: - PV Lab - Diagnostic Testing (echo, CT, nuclear med)  3rd Floor: - Vacant  4th Floor: - TCTS (cardiothoracic surgery) - AFib Clinic - Structural Heart Clinic - Vascular Surgery  - Vascular Ultrasound  5th Floor: - [facility] (general and EP) - Clinical Pharmacy for coumadin, hypertension, lipid,  weight-loss medications, and med management appointments    Valet parking services will be available as well.

## 2023-07-31 NOTE — Telephone Encounter (Signed)
 Patient agreement reviewed and signed on 07/31/2023.  WatchPAT issued to patient on 07/31/2023 by Danielle Rankin, CMA. Patient aware to not open the WatchPAT box until contacted with the activation PIN. Patient profile initialized in CloudPAT on 07/31/2023 by Windy Fast, RN. Device serial number: [id number]  Please list Reason for Call as Advice Only and type "WatchPAT issued to patient" in the comment box.

## 2023-07-31 NOTE — Progress Notes (Signed)
 Cardiology Office Note:  .   Date:  07/31/2023  ID:  Joseph Shannon, DOB Dec 09, 1980, MRN [id number] PCP: Eather Colas, FNP  [facility] Providers Cardiologist:  Truett Mainland, MD PCP: Eather Colas, FNP  Chief Complaint  Patient presents with   Hypertension   Hyperlipidemia   Follow-up     Joseph Shannon is a 43 y.o. male with hypertension, type 2 diabetes mellitus, OSA, nonischemic cardiomyopathy w/recovered EF, h/o TIA  Patient was seen by other providers in the past, today is my first visit with him.  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  He does not get any time to do any regular physical exercise outside of work.  He does have obstructive sleep apnea, but has not been using CPAP for over 1 year.  He takes his medications regularly.  Blood pressure generally well-controlled, diastolic blood pressure slightly elevated today.  Serial review of echocardiogram show normal EF at least as recently as 08/2022.  Coronary CT angiogram showed no calcium or coronary artery disease in 2021.  I am not sure why he takes Imdur 120 mg daily.  Reviewed recent lab results from PCP, details below.  Lipid panel was not checked.  Reportedly, his TIA many years ago was thought to be secondary to hypertension.     Vitals:   07/31/23 1115 07/31/23 1125  BP: (!) 124/96 (!) 122/90  Pulse: 73   Resp: 16   SpO2: 98%       Review of Systems  Cardiovascular:  Negative for chest pain, dyspnea on exertion, leg swelling, palpitations and syncope.  Respiratory:  Positive for snoring.         Studies Reviewed: Marland Kitchen        EKG 07/31/2023: Normal sinus rhythm Normal ECG When compared with ECG of 15-May-2018 13:13, No significant change was found    Independently interpreted 06/2023: HbA1C 6.3% Hb 13.1 Cr 1.03 TSH 0.41  Independently interpreted Echocardiogram 08/2022: Mild LVH.  LVEF 55 to 60%.  GLS -15.1%, mildly  abnormal.  Normal diastolic function. Normal RV systolic function. No significant valvular abnormality.   Physical Exam Vitals and nursing note reviewed.  Constitutional:      General: He is not in acute distress. Neck:     Vascular: No JVD.  Cardiovascular:     Rate and Rhythm: Normal rate and regular rhythm.     Heart sounds: Normal heart sounds. No murmur heard. Pulmonary:     Effort: Pulmonary effort is normal.     Breath sounds: Normal breath sounds. No wheezing or rales.  Musculoskeletal:     Right lower leg: No edema.     Left lower leg: No edema.      VISIT DIAGNOSES:   ICD-10-CM   1. Primary hypertension  I10 EKG 12-Lead    2. Mixed hyperlipidemia  E78.2     3. NICM (nonischemic cardiomyopathy) (HCC)  I42.8     4. History of TIA (transient ischemic attack)  Z86.73     5. Type 2 diabetes mellitus with other specified complication, without long-term current use of insulin (HCC)  E11.69        Assessment & Plan:  Joseph Shannon is a 43 y.o. male with hypertension, type 2 diabetes mellitus, OSA, nonischemic cardiomyopathy, h/o TIA  Assessment & Plan  Nonischemic cardiomyopathy. LVEF recovered, 08/2022. Euvolemic, does not take Lasix as needed. See below regarding hypertension management.  Hypertension: Systolic blood pressure well-controlled, diastolic  blood pressure remains around 90 mmHg. He is currently on multiple antihypertensive agents including lisinopril 40 mg daily, nebivolol 20 mg daily, hydrochlorothiazide 50 mg daily, spironolactone 25 mg daily, amlodipine 10 mg daily, as well as Imdur 120 mg daily. I do not see any prior workup for secondary hypertension. Recommend TSH, renin/aldosterone, renal artery duplex, sleep study. Continue current antihypertensive medications as above, however I have stopped him Imdur 120 mg daily in absence of angina CAD.  H/o TIA: Recommend aspirin 81 mg daily. Check lipid panel, continue Lipitor 80 mg  daily.    Meds ordered this encounter  Medications   amLODipine (NORVASC) 10 MG tablet    Sig: Take 1 tablet (10 mg total) by mouth daily.    Dispense:  90 tablet    Refill:  1   atorvastatin (LIPITOR) 80 MG tablet    Sig: Take 1 tablet (80 mg total) by mouth daily.    Dispense:  90 tablet    Refill:  1    Increase dose.   hydrALAZINE (APRESOLINE) 100 MG tablet    Sig: Take 1 tablet (100 mg total) by mouth 2 (two) times daily.    Dispense:  180 tablet    Refill:  1   hydrochlorothiazide (HYDRODIURIL) 50 MG tablet    Sig: Take 1 tablet (50 mg total) by mouth daily.    Dispense:  30 tablet    Refill:  11   lisinopril (ZESTRIL) 40 MG tablet    Sig: Take 1 tablet (40 mg total) by mouth daily.    Dispense:  90 tablet    Refill:  1   Nebivolol HCl (BYSTOLIC) 20 MG TABS    Sig: TAKE 1 TABLET BY MOUTH DAILY AT BEDTIME    Dispense:  90 tablet    Refill:  1   spironolactone (ALDACTONE) 25 MG tablet    Sig: Take 1 tablet (25 mg total) by mouth daily.    Dispense:  90 tablet    Refill:  1   aspirin EC 81 MG tablet    Sig: Take 1 tablet (81 mg total) by mouth daily. Swallow whole.    Dispense:  30 tablet    Refill:  6     F/u in 3 months w/APP  Signed, Elder Negus, MD

## 2023-08-01 ENCOUNTER — Telehealth: Payer: Self-pay | Admitting: *Deleted

## 2023-08-01 LAB — LIPID PANEL
Chol/HDL Ratio: 3.1 ratio (ref 0.0–5.0)
Cholesterol, Total: 188 mg/dL (ref 100–199)
HDL: 60 mg/dL (ref >39)
LDL Chol Calc (NIH): 112 mg/dL — ABNORMAL HIGH (ref 0–99)
Triglycerides: 86 mg/dL (ref 0–149)
VLDL Cholesterol Cal: 16 mg/dL (ref 5–40)

## 2023-08-01 LAB — TSH: TSH: 0.519 u[IU]/mL (ref 0.450–4.500)

## 2023-08-01 NOTE — Telephone Encounter (Signed)
 Ordering provider: DR Rosemary Holms Associated diagnoses: G47.30 WatchPAT PA obtained on 08/01/2023 by Latrelle Dodrill, CMA. Authorization: No; tracking ID NO PA REQ Patient notified of PIN ([phone]) on 08/01/2023 via Notification Method: phone.

## 2023-08-01 NOTE — Telephone Encounter (Signed)
-----   Message from Nurse Corky Crafts sent at 07/31/2023 11:52 AM EDT ----- Regarding: Joseph Shannon DR. PATWARDHAN Dr. Rosemary Holms ordered an [facility] on this pt and this was provided to him in the clinic today  This is for known OSA  Please pre-cert and handle accordingly   Thanks, Lajoyce Corners

## 2023-08-01 NOTE — Telephone Encounter (Signed)
 Called the PIN left message on cell phone. Ordering provider: DR Rosemary Holms Associated diagnoses: G47.30 WatchPAT PA obtained on 08/01/2023 by Latrelle Dodrill, CMA. Authorization: No; tracking ID NO PA REQ Patient notified of PIN ([phone]) on 08/01/2023 via Notification Method: phone.

## 2023-08-01 NOTE — Progress Notes (Signed)
 HDL 60, LDL 112.  With history of prior TIA,, hold for the LDL to be at least below 100, if not below 70. If he is taking Lipitor 80 mg daily, recommend adding Zetia 10 mg daily and repeat lipid panel in 3 months. If he is not taking Lipitor regularly, ensure compliance, and repeat lipid panel in 3 months.  Thanks MJP

## 2023-08-02 ENCOUNTER — Telehealth: Payer: Self-pay | Admitting: Cardiology

## 2023-08-02 DIAGNOSIS — I10 Essential (primary) hypertension: Secondary | ICD-10-CM

## 2023-08-02 DIAGNOSIS — Z79899 Other long term (current) drug therapy: Secondary | ICD-10-CM

## 2023-08-02 DIAGNOSIS — E7849 Other hyperlipidemia: Secondary | ICD-10-CM

## 2023-08-02 DIAGNOSIS — E782 Mixed hyperlipidemia: Secondary | ICD-10-CM

## 2023-08-02 NOTE — Telephone Encounter (Signed)
 Patient returned RN's call regarding results.  Patient stated can leave a detailed message in his voice mail.

## 2023-08-02 NOTE — Telephone Encounter (Signed)
 Pt contacted. Note in result note.

## 2023-08-02 NOTE — Progress Notes (Signed)
 Yes. Start Lipitor 80 mg without Zetia, check lipid panel in mid May 2025.  Thanks MJP

## 2023-08-05 ENCOUNTER — Telehealth: Payer: Self-pay

## 2023-08-05 ENCOUNTER — Encounter (INDEPENDENT_AMBULATORY_CARE_PROVIDER_SITE_OTHER): Payer: Self-pay | Admitting: Cardiology

## 2023-08-05 DIAGNOSIS — G4733 Obstructive sleep apnea (adult) (pediatric): Secondary | ICD-10-CM

## 2023-08-05 DIAGNOSIS — I5023 Acute on chronic systolic (congestive) heart failure: Secondary | ICD-10-CM

## 2023-08-05 DIAGNOSIS — R0609 Other forms of dyspnea: Secondary | ICD-10-CM

## 2023-08-05 DIAGNOSIS — Z8673 Personal history of transient ischemic attack (TIA), and cerebral infarction without residual deficits: Secondary | ICD-10-CM

## 2023-08-05 DIAGNOSIS — R079 Chest pain, unspecified: Secondary | ICD-10-CM

## 2023-08-05 DIAGNOSIS — I5042 Chronic combined systolic (congestive) and diastolic (congestive) heart failure: Secondary | ICD-10-CM

## 2023-08-05 DIAGNOSIS — I10 Essential (primary) hypertension: Secondary | ICD-10-CM

## 2023-08-05 DIAGNOSIS — I428 Other cardiomyopathies: Secondary | ICD-10-CM

## 2023-08-05 DIAGNOSIS — E1169 Type 2 diabetes mellitus with other specified complication: Secondary | ICD-10-CM

## 2023-08-05 NOTE — Telephone Encounter (Signed)
 [facility] is requesting a refill on non cardiac medication fluticasone (Flovent) inhaler. Would Dr. Rosemary Holms like to refill this non cardiac medication? Please address

## 2023-08-05 NOTE — Telephone Encounter (Signed)
 Please check with PCP.  Thanks MJP

## 2023-08-06 ENCOUNTER — Ambulatory Visit: Attending: Cardiology

## 2023-08-06 ENCOUNTER — Telehealth: Payer: Self-pay

## 2023-08-06 DIAGNOSIS — G4730 Sleep apnea, unspecified: Secondary | ICD-10-CM

## 2023-08-06 NOTE — Procedures (Signed)
   SLEEP STUDY REPORT Patient Information Study Date: 08/05/2023 Patient Name: Joseph Shannon Patient ID: [id number] Birth Date: 02-07-1981 Age: 43 Gender: Male BMI: 30.8 (W=240 lb, H=6' 2'') Stopbang: 5 Referring Physician: Rosemary Holms, MD  TEST DESCRIPTION: Home sleep apnea testing was completed using the WatchPat, a Type 1 device, utilizing  peripheral arterial tonometry (PAT), chest movement, actigraphy, pulse oximetry, pulse rate, body position and snore.  AHI was calculated with apnea and hypopnea using valid sleep time as the denominator. RDI includes apneas,  hypopneas, and RERAs. The data acquired and the scoring of sleep and all associated events were performed in  accordance with the recommended standards and specifications as outlined in the AASM Manual for the Scoring of  Sleep and Associated Events 2.2.0 (2015).  FINDINGS:  1. Moderate Obstructive Sleep Apnea with AHI 18/hr.   2. No Central Sleep Apnea with pAHIc 2/hr.  3. Oxygen desaturations as low as 87%.  4. Moderate to severe snoring was present. O2 sats were < 88% for 0.3 min.  5. Total sleep time was 6 hrs and 39 min.  6. 14.4% of total sleep time was spent in REM sleep.   7. Shortened sleep onset latency at 37 min  8. Prolonged REM sleep onset latency at 196 min.   9. Total awakenings were 33.  10. Arrhythmia detection: None  DIAGNOSIS:  Moderate Obstructive Sleep Apnea (G47.33)  RECOMMENDATIONS: 1. Clinical correlation of these findings is necessary. The decision to treat obstructive sleep apnea (OSA) is usually  based on the presence of apnea symptoms or the presence of associated medical conditions such as Hypertension,  Congestive Heart Failure, Atrial Fibrillation or Obesity. The most common symptoms of OSA are snoring, gasping for  breath while sleeping, daytime sleepiness and fatigue.   2. Initiating apnea therapy is recommended given the presence of symptoms and/or associated conditions.   Recommend proceeding with one of the following:   a. Auto-CPAP therapy with a pressure range of 5-20cm H2O.   b. An oral appliance (OA) that can be obtained from certain dentists with expertise in sleep medicine. These are  primarily of use in non-obese patients with mild and moderate disease.   c. An ENT consultation which may be useful to look for specific causes of obstruction and possible treatment  options.   d. If patient is intolerant to PAP therapy, consider referral to ENT for evaluation for hypoglossal nerve stimulator.   3. Close follow-up is necessary to ensure success with CPAP or oral appliance therapy for maximum benefit .  4. A follow-up oximetry study on CPAP is recommended to assess the adequacy of therapy and determine the need  for supplemental oxygen or the potential need for Bi-level therapy. An arterial blood gas to determine the adequacy of  baseline ventilation and oxygenation should also be considered.  5. Healthy sleep recommendations include: adequate nightly sleep (normal 7-9 hrs/night), avoidance of caffeine after  noon and alcohol near bedtime, and maintaining a sleep environment that is cool, dark and quiet.  6. Weight loss for overweight patients is recommended. Even modest amounts of weight loss can significantly  improve the severity of sleep apnea.  7. Snoring recommendations include: weight loss where appropriate, side sleeping, and avoidance of alcohol before  bed.  8. Operation of motor vehicle should not be performed when sleepy.  Signature: Armanda Magic, MD; [facility]; Diplomat, American Board of Sleep  Medicine Electronically Signed: 08/06/2023 12:44:48 PM

## 2023-08-06 NOTE — Telephone Encounter (Signed)
-----   Message from Armanda Magic sent at 08/06/2023 12:46 PM EDT ----- Please let patient know that they have sleep apnea and recommend treating with CPAP.  Please order an auto CPAP from 4-15cm H2O with heated humidity and mask of choice.  Order overnight pulse ox on CPAP.  Followup with me in 6 weeks.

## 2023-08-06 NOTE — Telephone Encounter (Signed)
 Left callback number for patient to receive sleep study results and recommendations.

## 2023-08-08 ENCOUNTER — Encounter: Payer: Self-pay | Admitting: *Deleted

## 2023-08-08 LAB — ALDOSTERONE + RENIN ACTIVITY W/ RATIO
Aldos/Renin Ratio: 2.8 (ref 0.0–30.0)
Aldosterone: 3.2 ng/dL (ref 0.0–30.0)
Renin Activity, Plasma: 1.131 ng/mL/h (ref 0.167–5.380)

## 2023-08-08 NOTE — Telephone Encounter (Signed)
 This encounter was created in error - please disregard.

## 2023-08-08 NOTE — Telephone Encounter (Signed)
Called results lmtcb. 

## 2023-08-08 NOTE — Telephone Encounter (Signed)
 Pt returning call to nurse for lab results. He said to please call him at 11:30 because that is when he takes lunch.

## 2023-08-08 NOTE — Telephone Encounter (Unsigned)
 Spoke with pt. Pt stated he has already started medication. Is aware of labs next month.

## 2023-08-08 NOTE — Telephone Encounter (Signed)
-----   Message from Armanda Magic sent at 08/06/2023 12:46 PM EDT ----- Please let patient know that they have sleep apnea and recommend treating with CPAP.  Please order an auto CPAP from 4-15cm H2O with heated humidity and mask of choice.  Order overnight pulse ox on CPAP.  Followup with me in 6 weeks.

## 2023-08-09 NOTE — Progress Notes (Signed)
 Please disregard the last message.  The plan to stay as follows.  Yes. Start Lipitor 80 mg without Zetia, check lipid panel in mid May 2025.  Thanks MJP

## 2023-08-09 NOTE — Progress Notes (Signed)
 LDL remains well above goal of 70. Continue Lipitor 80 mg daily, recommend adding Zetia 10 mg daily. Repeat lipid panel in 3 months.  If LDL remains >70, may need to consider injectable agents.  Thanks MJP

## 2023-08-12 ENCOUNTER — Ambulatory Visit (HOSPITAL_COMMUNITY): Admission: RE | Admit: 2023-08-12 | Source: Ambulatory Visit

## 2023-08-12 ENCOUNTER — Encounter (HOSPITAL_COMMUNITY): Payer: Self-pay

## 2023-08-15 NOTE — Telephone Encounter (Signed)
 Results in chart

## 2023-09-10 ENCOUNTER — Encounter: Payer: Self-pay | Admitting: Vascular Surgery

## 2023-09-10 ENCOUNTER — Ambulatory Visit (HOSPITAL_COMMUNITY)
Admission: RE | Admit: 2023-09-10 | Discharge: 2023-09-10 | Disposition: A | Discharge status: Home / Self Care | Source: Ambulatory Visit | Attending: Cardiology | Admitting: Cardiology

## 2023-09-10 DIAGNOSIS — I10 Essential (primary) hypertension: Secondary | ICD-10-CM | POA: Diagnosis not present

## 2023-09-10 DIAGNOSIS — Z8673 Personal history of transient ischemic attack (TIA), and cerebral infarction without residual deficits: Secondary | ICD-10-CM | POA: Diagnosis not present

## 2023-09-11 ENCOUNTER — Ambulatory Visit: Payer: Self-pay | Admitting: Cardiology

## 2023-10-07 ENCOUNTER — Telehealth: Payer: Self-pay | Admitting: Nurse Practitioner

## 2023-10-07 DIAGNOSIS — I10 Essential (primary) hypertension: Secondary | ICD-10-CM

## 2023-10-07 DIAGNOSIS — I428 Other cardiomyopathies: Secondary | ICD-10-CM

## 2023-10-07 DIAGNOSIS — I5042 Chronic combined systolic (congestive) and diastolic (congestive) heart failure: Secondary | ICD-10-CM

## 2023-10-07 DIAGNOSIS — Z8673 Personal history of transient ischemic attack (TIA), and cerebral infarction without residual deficits: Secondary | ICD-10-CM

## 2023-10-07 DIAGNOSIS — I5023 Acute on chronic systolic (congestive) heart failure: Secondary | ICD-10-CM

## 2023-10-07 DIAGNOSIS — R079 Chest pain, unspecified: Secondary | ICD-10-CM

## 2023-10-07 DIAGNOSIS — E1169 Type 2 diabetes mellitus with other specified complication: Secondary | ICD-10-CM

## 2023-10-07 DIAGNOSIS — R0609 Other forms of dyspnea: Secondary | ICD-10-CM

## 2023-10-07 DIAGNOSIS — E782 Mixed hyperlipidemia: Secondary | ICD-10-CM

## 2023-10-07 MED ORDER — SPIRONOLACTONE 25 MG PO TABS: 25.0000 mg | ORAL_TABLET | Freq: Every day | ORAL | 3 refills | Status: DC

## 2023-10-07 MED ORDER — HYDROCHLOROTHIAZIDE 50 MG PO TABS: 50.0000 mg | ORAL_TABLET | Freq: Every day | ORAL | 3 refills | Status: DC

## 2023-10-07 MED ORDER — ATORVASTATIN CALCIUM 80 MG PO TABS: 80.0000 mg | ORAL_TABLET | Freq: Every day | ORAL | 3 refills | Status: AC

## 2023-10-07 MED ORDER — LISINOPRIL 40 MG PO TABS: 40.0000 mg | ORAL_TABLET | Freq: Every day | ORAL | 3 refills | Status: AC

## 2023-10-07 MED ORDER — NEBIVOLOL HCL 20 MG PO TABS: ORAL_TABLET | ORAL | 3 refills | Status: DC

## 2023-10-07 MED ORDER — AMLODIPINE BESYLATE 10 MG PO TABS
10.0000 mg | ORAL_TABLET | Freq: Every day | ORAL | 3 refills | Status: AC
Start: 2023-10-07 — End: ?

## 2023-10-07 MED ORDER — HYDRALAZINE HCL 100 MG PO TABS: 100.0000 mg | ORAL_TABLET | Freq: Two times a day (BID) | ORAL | 3 refills | Status: AC

## 2023-10-07 NOTE — Telephone Encounter (Signed)
 Pt's medications were sent to pt's pharmacy as requested. Confirmation received.

## 2023-10-07 NOTE — Telephone Encounter (Signed)
*  STAT* If patient is at the pharmacy, call can be transferred to refill team.   1. Which medications need to be refilled? (please list name of each medication and dose if known) Amlodipine , Atorvastatin , Hydralazine , Hydrochlorothiazide , Lisinopril , a Bystolic ,, Isosorbide ,  and Spironolactone    2. Would you like to learn more about the convenience, safety, & potential cost savings by using the [facility]?    3. Are you open to using the [facility] (Type [facility]   4. Which pharmacy/location (including street and city if local pharmacy) is medication to be sent to? [facility] [address], [place]   5. Do they need a 30 day or 90 day supply? 30 days and refills- please call today- out of medicine

## 2023-10-31 ENCOUNTER — Ambulatory Visit: Admitting: Cardiology

## 2023-11-19 ENCOUNTER — Ambulatory Visit: Admitting: Nurse Practitioner

## 2023-11-22 ENCOUNTER — Telehealth: Payer: Self-pay | Admitting: Cardiology

## 2023-11-22 DIAGNOSIS — E119 Type 2 diabetes mellitus without complications: Secondary | ICD-10-CM

## 2023-11-22 NOTE — Telephone Encounter (Signed)
*  STAT* If patient is at the pharmacy, call can be transferred to refill team.   1. Which medications need to be refilled? (please list name of each medication and dose if known) metFORMIN  (GLUCOPHAGE ) 500 MG tablet    2. Would you like to learn more about the convenience, safety, & potential cost savings by using the [facility]?     3. Are you open to using the [facility] (Type [facility]. ).   4. Which pharmacy/location (including street and city if local pharmacy) is medication to be sent to? [facility] [place] - [address]    5. Do they need a 30 day or 90 day supply? 90 day  Patient is out of mediation.

## 2023-11-26 NOTE — Telephone Encounter (Signed)
 Spoke with the patient who states he has not been able to reach his pcp and wants to know if we can refill his metformin  due to him being out of medication for a week. I advised the patient to try to reach his pcp since they manage his diabetes. He states Jackee told him he would refill the medication. I explained to the patient that Jackee was not in the office and I would reach out to his primary cardiologist and see if they would be willing to refill medication. I advised the patient to try to reach out to his pcp again because this medication is not cardiac and I could not guarantee they would refill the medication but I would send them a message. He voiced understanding but was a little upset because I could not refill his medication. I also advised the patient to not wait until he is out of medication to request a refill. The last time cardiology refilled this medication it was 2021 by Dr Maranda.

## 2023-11-26 NOTE — Telephone Encounter (Signed)
 Patient called again to follow-up on refilling his metFORMIN  (GLUCOPHAGE ) 500 MG tablet medication and noted he has been without this medication for a week.

## 2023-11-27 NOTE — Telephone Encounter (Signed)
 Ideally, all noncardiac medications should be refilled by his PCP. Metformin  medication refill can safely wait until he can touch base with his PCP.  Thanks MJP

## 2023-11-28 NOTE — Telephone Encounter (Signed)
 Left message to call back

## 2023-12-02 NOTE — Telephone Encounter (Signed)
 Left message to call back

## 2023-12-04 NOTE — Telephone Encounter (Signed)
 Attempted to contact pt on three separate occasions. Will wait for pt to return call to be advised.

## 2023-12-04 NOTE — Telephone Encounter (Signed)
 Left message to call back

## 2023-12-06 ENCOUNTER — Ambulatory Visit: Attending: Cardiology | Admitting: Cardiology

## 2023-12-06 ENCOUNTER — Encounter: Payer: Self-pay | Admitting: Cardiology

## 2023-12-06 ENCOUNTER — Other Ambulatory Visit (HOSPITAL_COMMUNITY): Payer: Self-pay

## 2023-12-06 VITALS — BP 114/72 | HR 86 | Ht 74.5 in | Wt 246.2 lb

## 2023-12-06 DIAGNOSIS — I10 Essential (primary) hypertension: Secondary | ICD-10-CM

## 2023-12-06 DIAGNOSIS — R0609 Other forms of dyspnea: Secondary | ICD-10-CM

## 2023-12-06 DIAGNOSIS — R600 Localized edema: Secondary | ICD-10-CM

## 2023-12-06 DIAGNOSIS — E782 Mixed hyperlipidemia: Secondary | ICD-10-CM

## 2023-12-06 DIAGNOSIS — I428 Other cardiomyopathies: Secondary | ICD-10-CM | POA: Diagnosis not present

## 2023-12-06 DIAGNOSIS — R079 Chest pain, unspecified: Secondary | ICD-10-CM

## 2023-12-06 DIAGNOSIS — E1169 Type 2 diabetes mellitus with other specified complication: Secondary | ICD-10-CM

## 2023-12-06 DIAGNOSIS — I5042 Chronic combined systolic (congestive) and diastolic (congestive) heart failure: Secondary | ICD-10-CM

## 2023-12-06 DIAGNOSIS — Z8673 Personal history of transient ischemic attack (TIA), and cerebral infarction without residual deficits: Secondary | ICD-10-CM

## 2023-12-06 DIAGNOSIS — I5023 Acute on chronic systolic (congestive) heart failure: Secondary | ICD-10-CM

## 2023-12-06 MED ORDER — HYDROCHLOROTHIAZIDE 25 MG PO TABS
25.0000 mg | ORAL_TABLET | Freq: Every day | ORAL | 2 refills | Status: AC
Start: 2023-12-06 — End: ?

## 2023-12-06 MED ORDER — HYDROCHLOROTHIAZIDE 25 MG PO TABS
25.0000 mg | ORAL_TABLET | Freq: Every day | ORAL | 2 refills | Status: DC
Start: 2023-12-06 — End: 2023-12-06
  Filled 2023-12-06: qty 30, 30d supply, fill #0

## 2023-12-06 MED ORDER — NEBIVOLOL HCL 20 MG PO TABS
20.0000 mg | ORAL_TABLET | Freq: Every day | ORAL | 1 refills | Status: DC
  Filled 2023-12-06: qty 90, 90d supply, fill #0

## 2023-12-06 MED ORDER — NEBIVOLOL HCL 20 MG PO TABS: 20.0000 mg | ORAL_TABLET | Freq: Every day | ORAL | 1 refills | Status: AC

## 2023-12-06 NOTE — Patient Instructions (Addendum)
 Medication Instructions:  DECREASE hydrochlorothiazide  25 daily   *If you need a refill on your cardiac medications before your next appointment, please call your pharmacy*  Lab Work: Lipid panel   If you have labs (blood work) drawn today and your tests are completely normal, you will receive your results only by: MyChart Message (if you have MyChart) OR A paper copy in the mail If you have any lab test that is abnormal or we need to change your treatment, we will call you to review the results.  Testing/Procedures: Venous doppler LE  Your physician has requested that you have a lower or upper extremity venous duplex. This test is an ultrasound of the veins in the legs or arms. It looks at venous blood flow that carries blood from the heart to the legs or arms. Allow one hour for a Lower Venous exam. Allow thirty minutes for an Upper Venous exam. There are no restrictions or special instructions.  Please note: We ask at that you not bring children with you during ultrasound (echo/ vascular) testing. Due to room size and safety concerns, children are not allowed in the ultrasound rooms during exams. Our front office staff cannot provide observation of children in our lobby area while testing is being conducted. An adult accompanying a patient to their appointment will only be allowed in the ultrasound room at the discretion of the ultrasound technician under special circumstances. We apologize for any inconvenience.   Venous Reflux LE Your physician has requested that you have a lower or upper extremity venous duplex. This test is an ultrasound of the veins in the legs or arms. It looks at venous blood flow that carries blood from the heart to the legs or arms. Allow one hour for a Lower Venous exam. Allow thirty minutes for an Upper Venous exam. There are no restrictions or special instructions.  Please note: We ask at that you not bring children with you during ultrasound (echo/ vascular)  testing. Due to room size and safety concerns, children are not allowed in the ultrasound rooms during exams. Our front office staff cannot provide observation of children in our lobby area while testing is being conducted. An adult accompanying a patient to their appointment will only be allowed in the ultrasound room at the discretion of the ultrasound technician under special circumstances. We apologize for any inconvenience.   Follow-Up: At [facility], you and your health needs are our priority.  As part of our continuing mission to provide you with exceptional heart care, our providers are all part of one team.  This team includes your primary Cardiologist (physician) and Advanced Practice Providers or APPs (Physician Assistants and Nurse Practitioners) who all work together to provide you with the care you need, when you need it.  Your next appointment:   3 month(s)  Provider:   One of our Advanced Practice Providers (APPs): Morse Clause, PA-C  Lamarr Satterfield, NP Miriam Shams, NP  Olivia Pavy, PA-C Josefa Beauvais, NP  Leontine Salen, PA-C Orren Fabry, PA-C  [place], [place] Jackee Alberts, NP  Damien Braver, NP Jon Hails, PA-C  Waddell Donath, PA-C    Dayna Dunn, PA-C  Glendia Ferrier, PA-C Lum Louis, NP Katlyn West, NP Callie Goodrich, PA-C  Evan Williams, PA-C Sheng Haley, PA-C  Xika Zhao, NP Kathleen Johnson, PA-C

## 2023-12-06 NOTE — Progress Notes (Signed)
 Cardiology Office Note:  .   Date:  12/06/2023  ID:  [name], DOB 05/13/1980, MRN [id number] PCP: Katrinka Duwaine LABOR, FNP  [facility] Providers Cardiologist:  Newman Lawrence, MD PCP: Katrinka Duwaine LABOR, FNP  Chief Complaint  Patient presents with   Hypertension        Sleep Apnea   nonischemic cardiomyopathy w/recovered EF     Joseph Shannon is a 43 y.o. male with hypertension, type 2 diabetes mellitus, OSA, nonischemic cardiomyopathy w/recovered EF, h/o TIA  Patient works as a [place] [employer], works 16 hours a day including physical work.  He does not get any time to do any regular physical exercise outside of work.  He does have obstructive sleep apnea, but has not been very compliant with CPAP. Blood pressure very well well-controlled, he is on multiple medications. No clear secondary cause identified.   Reportedly, his TIA many years ago was thought to be secondary to hypertension.   Vitals:   12/06/23 1416  BP: 114/72  Pulse: 86  SpO2: 94%       Review of Systems  Cardiovascular:  Negative for chest pain, dyspnea on exertion, leg swelling, palpitations and syncope.  Respiratory:  Positive for snoring.         Studies Reviewed: SABRA        EKG 07/31/2023: Normal sinus rhythm Normal ECG When compared with ECG of 15-May-2018 13:13, No significant change was found    Labs 07/2023: Chol 188, TG 86, HDL 60, LDL 112 TSH 0.5 Renin/aldosterone 3.2/2.8    Labs 06/2023: HbA1C 6.3% Hb 13.1 Cr 1.03 TSH 0.41  Independently interpreted Echocardiogram 08/2022: Mild LVH.  LVEF 55 to 60%.  GLS -15.1%, mildly abnormal.  Normal diastolic function. Normal RV systolic function. No significant valvular abnormality.   Physical Exam Vitals and nursing note reviewed.  Constitutional:      General: He is not in acute distress. Neck:     Vascular: No JVD.  Cardiovascular:     Rate and Rhythm: Normal rate and regular rhythm.     Heart  sounds: Normal heart sounds. No murmur heard. Pulmonary:     Effort: Pulmonary effort is normal.     Breath sounds: Normal breath sounds. No wheezing or rales.  Musculoskeletal:     Right lower leg: No edema.     Left lower leg: No edema.      VISIT DIAGNOSES:   ICD-10-CM   1. Leg edema  R60.0 VAS US  LOWER EXTREMITY VENOUS REFLUX    CANCELED: VAS US  LOWER EXTREMITY VENOUS (DVT)    2. Primary hypertension  I10 hydrochlorothiazide  (HYDRODIURIL ) 25 MG tablet    Nebivolol  HCl (BYSTOLIC ) 20 MG TABS    DISCONTINUED: hydrochlorothiazide  (HYDRODIURIL ) 25 MG tablet    DISCONTINUED: Nebivolol  HCl (BYSTOLIC ) 20 MG TABS    3. Mixed hyperlipidemia  E78.2 Lipid panel    hydrochlorothiazide  (HYDRODIURIL ) 25 MG tablet    Nebivolol  HCl (BYSTOLIC ) 20 MG TABS    DISCONTINUED: hydrochlorothiazide  (HYDRODIURIL ) 25 MG tablet    DISCONTINUED: Nebivolol  HCl (BYSTOLIC ) 20 MG TABS    4. NICM (nonischemic cardiomyopathy) (HCC)  I42.8 hydrochlorothiazide  (HYDRODIURIL ) 25 MG tablet    Nebivolol  HCl (BYSTOLIC ) 20 MG TABS    DISCONTINUED: hydrochlorothiazide  (HYDRODIURIL ) 25 MG tablet    DISCONTINUED: Nebivolol  HCl (BYSTOLIC ) 20 MG TABS    5. History of TIA (transient ischemic attack)  Z86.73 hydrochlorothiazide  (HYDRODIURIL ) 25 MG tablet    Nebivolol  HCl (BYSTOLIC ) 20 MG TABS  DISCONTINUED: hydrochlorothiazide  (HYDRODIURIL ) 25 MG tablet    DISCONTINUED: Nebivolol  HCl (BYSTOLIC ) 20 MG TABS    6. Type 2 diabetes mellitus with other specified complication, without long-term current use of insulin  (HCC)  E11.69 hydrochlorothiazide  (HYDRODIURIL ) 25 MG tablet    Nebivolol  HCl (BYSTOLIC ) 20 MG TABS    DISCONTINUED: hydrochlorothiazide  (HYDRODIURIL ) 25 MG tablet    DISCONTINUED: Nebivolol  HCl (BYSTOLIC ) 20 MG TABS        Assessment & Plan:  Joseph Shannon is a 43 y.o. male with hypertension, type 2 diabetes mellitus, OSA, nonischemic cardiomyopathy, h/o TIA  Assessment & Plan  Nonischemic  cardiomyopathy. LVEF recovered, 08/2022. I am not convinced that his leg edema is due to heart failure. He works long hours standing as Personnel officer. Recommend regular usage of compression stockings. Also recommend venous reflux study.  If venous  insufficiency conformed, we can wean him off lasix . See below regarding hypertension management.  Hypertension: He is on multiple medications with which blood pressure is well controlled. We may be able to wean off some medications.  Reduce hydrochlorothiazide  from 50 mg daily to 25 mg daily. Refilled nebivolol .Continue lisinopril  40 mg daily, nebivolol  20 mg daily, spironolactone  25 mg daily, amlodipine  10 mg daily for now.  H/o TIA: Recommend aspirin  81 mg daily. Check lipid panel, continue Lipitor 80 mg daily.    Meds ordered this encounter  Medications   DISCONTD: hydrochlorothiazide  (HYDRODIURIL ) 25 MG tablet    Sig: Take 1 tablet (25 mg total) by mouth daily.    Dispense:  30 tablet    Refill:  2   DISCONTD: Nebivolol  HCl (BYSTOLIC ) 20 MG TABS    Sig: Take 1 tablet (20 mg total) by mouth at bedtime.    Dispense:  90 tablet    Refill:  1   hydrochlorothiazide  (HYDRODIURIL ) 25 MG tablet    Sig: Take 1 tablet (25 mg total) by mouth daily.    Dispense:  30 tablet    Refill:  2   Nebivolol  HCl (BYSTOLIC ) 20 MG TABS    Sig: Take 1 tablet (20 mg total) by mouth at bedtime.    Dispense:  90 tablet    Refill:  1     F/u in 3 months w/APP  Signed, Newman JINNY Lawrence, MD

## 2023-12-07 ENCOUNTER — Encounter: Payer: Self-pay | Admitting: Cardiology

## 2023-12-07 DIAGNOSIS — R600 Localized edema: Secondary | ICD-10-CM | POA: Insufficient documentation

## 2023-12-09 ENCOUNTER — Other Ambulatory Visit (HOSPITAL_COMMUNITY): Payer: Self-pay

## 2023-12-10 ENCOUNTER — Ambulatory Visit (HOSPITAL_COMMUNITY)
Admission: RE | Admit: 2023-12-10 | Discharge: 2023-12-10 | Disposition: A | Discharge status: Home / Self Care | Source: Ambulatory Visit | Attending: Cardiology | Admitting: Cardiology

## 2023-12-10 DIAGNOSIS — R600 Localized edema: Secondary | ICD-10-CM | POA: Diagnosis present

## 2023-12-13 ENCOUNTER — Ambulatory Visit: Payer: Self-pay | Admitting: Cardiology

## 2023-12-13 NOTE — Progress Notes (Signed)
 Surprisingly, no venous reflux disease noted.  May need to continue Lasix  as is for now.  Thanks MJP

## 2024-01-06 ENCOUNTER — Other Ambulatory Visit: Payer: Self-pay | Admitting: Nurse Practitioner

## 2024-01-06 DIAGNOSIS — E1169 Type 2 diabetes mellitus with other specified complication: Secondary | ICD-10-CM

## 2024-01-06 DIAGNOSIS — Z8673 Personal history of transient ischemic attack (TIA), and cerebral infarction without residual deficits: Secondary | ICD-10-CM

## 2024-01-06 DIAGNOSIS — E782 Mixed hyperlipidemia: Secondary | ICD-10-CM

## 2024-01-06 DIAGNOSIS — R0609 Other forms of dyspnea: Secondary | ICD-10-CM

## 2024-01-06 DIAGNOSIS — I428 Other cardiomyopathies: Secondary | ICD-10-CM

## 2024-01-06 DIAGNOSIS — I10 Essential (primary) hypertension: Secondary | ICD-10-CM

## 2024-01-06 DIAGNOSIS — R079 Chest pain, unspecified: Secondary | ICD-10-CM

## 2024-01-06 DIAGNOSIS — I5042 Chronic combined systolic (congestive) and diastolic (congestive) heart failure: Secondary | ICD-10-CM

## 2024-01-06 DIAGNOSIS — I5023 Acute on chronic systolic (congestive) heart failure: Secondary | ICD-10-CM

## 2024-01-15 DIAGNOSIS — E1165 Type 2 diabetes mellitus with hyperglycemia: Secondary | ICD-10-CM | POA: Diagnosis not present

## 2024-01-15 DIAGNOSIS — E782 Mixed hyperlipidemia: Secondary | ICD-10-CM | POA: Diagnosis not present

## 2024-01-15 DIAGNOSIS — N1831 Chronic kidney disease, stage 3a: Secondary | ICD-10-CM | POA: Diagnosis not present

## 2024-01-15 DIAGNOSIS — J4521 Mild intermittent asthma with (acute) exacerbation: Secondary | ICD-10-CM | POA: Diagnosis not present

## 2024-03-02 ENCOUNTER — Encounter: Payer: Self-pay | Admitting: Nurse Practitioner

## 2024-03-02 ENCOUNTER — Ambulatory Visit: Attending: Nurse Practitioner | Admitting: Nurse Practitioner

## 2024-03-02 VITALS — BP 136/74 | HR 83 | Ht 75.0 in | Wt 247.0 lb

## 2024-03-02 DIAGNOSIS — I10 Essential (primary) hypertension: Secondary | ICD-10-CM

## 2024-03-02 DIAGNOSIS — E782 Mixed hyperlipidemia: Secondary | ICD-10-CM | POA: Diagnosis not present

## 2024-03-02 DIAGNOSIS — G4733 Obstructive sleep apnea (adult) (pediatric): Secondary | ICD-10-CM

## 2024-03-02 DIAGNOSIS — I428 Other cardiomyopathies: Secondary | ICD-10-CM

## 2024-03-02 DIAGNOSIS — Z8673 Personal history of transient ischemic attack (TIA), and cerebral infarction without residual deficits: Secondary | ICD-10-CM | POA: Diagnosis not present

## 2024-03-02 DIAGNOSIS — E1169 Type 2 diabetes mellitus with other specified complication: Secondary | ICD-10-CM

## 2024-03-02 NOTE — Patient Instructions (Signed)
 Medication Instructions:  Your physician recommends that you continue on your current medications as directed. Please refer to the Current Medication list given to you today.  *If you need a refill on your cardiac medications before your next appointment, please call your pharmacy*  Lab Work: Fasting Lipid panel, CMET. Nothing to eat after midnight. Please drink water and complete lab work any morning at your convenience.  Testing/Procedures: NONE ordered at this time of appointment   Follow-Up: At [facility], you and your health needs are our priority.  As part of our continuing mission to provide you with exceptional heart care, our providers are all part of one team.  This team includes your primary Cardiologist (physician) and Advanced Practice Providers or APPs (Physician Assistants and Nurse Practitioners) who all work together to provide you with the care you need, when you need it.  Your next appointment:   6 month(s)  Provider:   Newman JINNY Lawrence, MD    We recommend signing up for the patient portal called MyChart.  Sign up information is provided on this After Visit Summary.  MyChart is used to connect with patients for Virtual Visits (Telemedicine).  Patients are able to view lab/test results, encounter notes, upcoming appointments, etc.  Non-urgent messages can be sent to your provider as well.   To learn more about what you can do with MyChart, go to [email or web address].

## 2024-03-02 NOTE — Progress Notes (Signed)
 Office Visit    Patient Name: Joseph Shannon Date of Encounter: 03/02/2024  Primary Care Provider:  Katrinka Duwaine LABOR, FNP Primary Cardiologist:  Newman JINNY Lawrence, MD  Chief Complaint    43 year old male with a history of NICM with recovered EF, TIA, hypertension, hyperlipidemia, type 2 diabetes, and OSA who presents for follow-up related to hypertension and cardiomyopathy.  Past Medical History    Past Medical History:  Diagnosis Date   Asthma    Chronic combined systolic and diastolic CHF (congestive heart failure) (HCC)    Diabetes mellitus without complication (HCC)    Hypertension    NICM (nonischemic cardiomyopathy) (HCC)    Normal coronary arteries    OSA (obstructive sleep apnea)    TIA (transient ischemic attack) 2014   Past Surgical History:  Procedure Laterality Date   HIP CLOSED REDUCTION Right 07/12/2018   Procedure: CLOSED REDUCTION HIP;  Surgeon: Elsa Lonni SAUNDERS, MD;  Location: [facility] OR;  Service: Orthopedics;  Laterality: Right;   INSERTION OF TRACTION PIN Right 07/12/2018   Procedure: INSERTION OF TRACTION PIN;  Surgeon: Elsa Lonni SAUNDERS, MD;  Location: [facility] OR;  Service: Orthopedics;  Laterality: Right;   NO PAST SURGERIES     OPEN REDUCTION INTERNAL FIXATION ACETABULUM POSTERIOR LATERAL Right 07/14/2018   Procedure: OPEN REDUCTION INTERNAL FIXATION ACETABULUM POSTERIOR LATERAL;  Surgeon: Kendal Franky SQUIBB, MD;  Location: [facility] OR;  Service: Orthopedics;  Laterality: Right;    Allergies  Allergies  Allergen Reactions   Naproxen Sodium Nausea And Vomiting     Labs/Other Studies Reviewed    The following studies were reviewed today:  Cardiac Studies & Procedures   ______________________________________________________________________________________________     ECHOCARDIOGRAM  ECHOCARDIOGRAM COMPLETE 09/20/2022  Narrative ECHOCARDIOGRAM REPORT    Patient Name:   Joseph Shannon Date of Exam: 09/20/2022 Medical Rec #:  [id number]          Height:       74.5 in Accession #:    [id number]        Weight:       243.4 lb Date of Birth:  07/02/1980         BSA:          2.374 m Patient Age:    41 years          BP:           112/74 mmHg Patient Gender: M                 HR:           75 bpm. Exam Location:  [address]  Procedure: 2D Echo, 3D Echo, Cardiac Doppler, Color Doppler and Strain Analysis  Indications:    I42.8 Nonischemic cardiomyopathy  History:        Patient has prior history of Echocardiogram examinations, most recent 05/20/2018. TIA; Risk Factors:Hypertension and Diabetes. Obstructive sleep apnea. Nonischemic cardiomyopathy.  Sonographer:    Carl Coma RDCS Referring Phys: [id number] JACKEE VEAR RADDLE DICK  IMPRESSIONS   1. Left ventricular ejection fraction, by estimation, is 55 to 60%. The left ventricle has normal function. The left ventricle has no regional wall motion abnormalities. There is mild left ventricular hypertrophy. Left ventricular diastolic parameters were normal. The average left ventricular global longitudinal strain is -15.1 %. The global longitudinal strain is abnormal. 2. Right ventricular systolic function is normal. The right ventricular size is normal. Tricuspid regurgitation signal is inadequate for assessing PA pressure. 3. The mitral valve is normal in structure.  Trivial mitral valve regurgitation. 4. The aortic valve is tricuspid. Aortic valve regurgitation is not visualized. No aortic stenosis is present. 5. The inferior vena cava is normal in size with greater than 50% respiratory variability, suggesting right atrial pressure of 3 mmHg.  Comparison(s): No significant change from prior study.  FINDINGS Left Ventricle: Left ventricular ejection fraction, by estimation, is 55 to 60%. The left ventricle has normal function. The left ventricle has no regional wall motion abnormalities. The average left ventricular global longitudinal strain is -15.1 %. The global longitudinal  strain is abnormal. The left ventricular internal cavity size was normal in size. There is mild left ventricular hypertrophy. Left ventricular diastolic parameters were normal.  Right Ventricle: The right ventricular size is normal. No increase in right ventricular wall thickness. Right ventricular systolic function is normal. Tricuspid regurgitation signal is inadequate for assessing PA pressure.  Left Atrium: Left atrial size was normal in size.  Right Atrium: Right atrial size was normal in size.  Pericardium: There is no evidence of pericardial effusion.  Mitral Valve: The mitral valve is normal in structure. Trivial mitral valve regurgitation.  Tricuspid Valve: The tricuspid valve is normal in structure. Tricuspid valve regurgitation is trivial.  Aortic Valve: The aortic valve is tricuspid. Aortic valve regurgitation is not visualized. No aortic stenosis is present.  Pulmonic Valve: The pulmonic valve was normal in structure. Pulmonic valve regurgitation is trivial.  Aorta: The aortic root is normal in size and structure.  Venous: The inferior vena cava is normal in size with greater than 50% respiratory variability, suggesting right atrial pressure of 3 mmHg.  IAS/Shunts: The atrial septum is grossly normal.   LEFT VENTRICLE PLAX 2D LVIDd:         5.30 cm   Diastology LVIDs:         3.60 cm   LV e' medial:    7.58 cm/s LV PW:         1.30 cm   LV E/e' medial:  12.7 LV IVS:        1.30 cm   LV e' lateral:   10.80 cm/s LVOT diam:     2.20 cm   LV E/e' lateral: 8.9 LV SV:         71 LV SV Index:   30        2D Longitudinal Strain LVOT Area:     3.80 cm  2D Strain GLS (A2C):   -15.2 % 2D Strain GLS (A3C):   -15.1 % 2D Strain GLS (A4C):   -15.1 % 2D Strain GLS Avg:     -15.1 %  3D Volume EF: 3D EF:        54 % LV EDV:       198 ml LV ESV:       92 ml LV SV:        106 ml  RIGHT VENTRICLE             IVC RV Basal diam:  4.50 cm     IVC diam: 1.60 cm RV S prime:      15.65 cm/s TAPSE (M-mode): 2.7 cm  LEFT ATRIUM             Index        RIGHT ATRIUM           Index LA diam:        5.20 cm 2.19 cm/m   RA Area:     16.10 cm LA Vol (A2C):  43.8 ml 18.45 ml/m  RA Volume:   45.30 ml  19.09 ml/m LA Vol (A4C):   50.1 ml 21.11 ml/m LA Biplane Vol: 46.9 ml 19.76 ml/m AORTIC VALVE LVOT Vmax:   94.70 cm/s LVOT Vmean:  63.100 cm/s LVOT VTI:    0.187 m  AORTA Ao Root diam: 3.90 cm Ao Asc diam:  3.70 cm  MITRAL VALVE MV Area (PHT): 3.62 cm    SHUNTS MV Decel Time: 210 msec    Systemic VTI:  0.19 m MV E velocity: 95.90 cm/s  Systemic Diam: 2.20 cm MV A velocity: 85.15 cm/s MV E/A ratio:  1.13  Joseph Sorrow MD Electronically signed by Joseph Sorrow MD Signature Date/Time: 09/20/2022/12:43:10 PM    Final    MONITORS  CARDIAC EVENT MONITOR 03/28/2017  Narrative  Sinus bradycardia to sinus rhythm.  Normal 30 day event monitor. Sinus bradycardia predominantly at night with max pause 2.4 seconds.   CT SCANS  CT CORONARY MORPH W/CTA COR W/SCORE 08/02/2016  Addendum 08/04/2016  3:00 PM ADDENDUM REPORT: 08/04/2016 14:58  CLINICAL DATA:  43 year old male with atypical chest pain.  EXAM: Cardiac/Coronary  CT  TECHNIQUE: The patient was scanned on a Philips 256 scanner.  FINDINGS: A 120 kV prospective scan was triggered in the descending thoracic aorta at 111 HU's. Axial non-contrast 3 mm slices were carried out through the heart. The data set was analyzed on a dedicated work station and scored using the Agatson method. Gantry rotation speed was 270 msecs and collimation was .9 mm. 10 mg of iv Metoprolol  and 0.8 mg of sl NTG was given. The 3D data set was reconstructed in 5% intervals of the 67-82 % of the R-R cycle. Diastolic phases were analyzed on a dedicated work station using MPR, MIP and VRT modes. The patient received 80 cc of contrast.  Aorta:  Normal size.  No calcifications.  No dissection.  Aortic Valve:   Trileaflet.  No calcifications.  Coronary Arteries:  Normal coronary origin.  Right dominance.  RCA is a large dominant artery that gives rise to PDA and PLVB. There is no plaque.  Left main is a large artery that gives rise to LAD and LCX arteries.  LAD is a large vessel that gives rise to two diagonal branches and has no plaque.  LCX is a non-dominant artery that gives rise to one large OM1 branch. There is no plaque.  Other findings:  Normal pulmonary vein drainage into the left atrium.  Normal let atrial appendage without a thrombus.  Mildly dilated pulmonary artery measuring 30 x 25 mm.  IMPRESSION: 1. Coronary calcium  score of 0. This was 0 percentile for age and sex matched control.  2. Normal coronary origin with right dominance.  3. No evidence of CAD.  4. Mildly dilated pulmonary artery suggestive of pulmonary hypertension.  Joseph Shannon   Electronically Signed By: Joseph Shannon On: 08/04/2016 14:58  Narrative EXAM: OVER-READ INTERPRETATION  CT CHEST  The following report is an over-read performed by radiologist Dr. Juliene Cramp [facility] on 08/03/2016. This over-read does not include interpretation of cardiac or coronary anatomy or pathology. The coronary calcium  score/coronary CTA interpretation by the cardiologist is attached.  COMPARISON:  None.  FINDINGS: Cardiovascular: Normal caliber of the thoracic aorta without dissection. Main pulmonary arteries are patent. Heart size is within normal limits. No significant pericardial effusion.  Mediastinum/Nodes: Soft tissue in the subcarinal region is prominent measuring up to 1.1 cm. Left hilar tissue is also prominent measuring  0.7 cm in short axis on sequence 504, image 19. Small amount of tissue in the anterior mediastinum probably represents residual thymic tissue. Enlarged right hilar node measures 1.0 cm in short axis on sequence 504, image 32.  Lungs/Pleura: There  appears to be a small calcified granuloma along the pleural surface of the right upper lobe on sequence 206, image 6 measuring 0.2 cm. There is a moderate-sized focus of airspace disease in the left upper lobe. Findings are suggestive for pneumonia. Pleural-based nodular structure in the anterior right upper lobe on sequence 204, image 9 may have a calcification and measures up to 0.4 cm. There are scattered small pleural-based densities and nodules. There is probably a small focus of atelectasis along the anterior right middle lobe. Cannot exclude a small nodule in the anterior right middle lobe measuring up to 0.4 cm best seen on the sagittal images, sequence 506, image 5. There is a small ground-glass focus in the superior segment of the right upper lobe on sequence 204, image 20. This could represent airspace disease. Pleural-based nodule in the left lower lobe measures 0.4 cm on sequence 204, image 47.  Upper Abdomen: Unremarkable.  Musculoskeletal: No acute bone abnormality.  IMPRESSION: Moderate-sized area of airspace disease involving the left upper lobe. There is a small focus of ground-glass disease in the superior segment of the right lower lobe. Findings are suggestive for multifocal pneumonia.  Mild mediastinal and bilateral hilar lymphadenopathy which could be reactive but nonspecific.  Multiple small pleural-based nodules, some of which are calcified. These small nodules are indeterminate. No follow-up needed if patient is low-risk (and has no known or suspected primary neoplasm). Non-contrast chest CT can be considered in 12 months if patient is high-risk. This recommendation follows the consensus statement: Guidelines for Management of Incidental Pulmonary Nodules Detected on CT Images: From the Fleischner Society 2017; Radiology 2017; 284:228-243.  These results will be called to the ordering clinician or representative by the Radiologist Assistant, and  communication documented in the PACS or zVision Dashboard.  Electronically Signed: By: Joseph Shannon M.D. On: 08/03/2016 14:32     ______________________________________________________________________________________________     Recent Labs: 07/31/2023: TSH 0.519  Recent Lipid Panel    Component Value Date/Time   CHOL 188 07/31/2023 1317   TRIG 86 07/31/2023 1317   HDL 60 07/31/2023 1317   CHOLHDL 3.1 07/31/2023 1317   CHOLHDL 3.4 08/31/2015 0920   VLDL 18 08/31/2015 0920   LDLCALC 112 (H) 07/31/2023 1317    History of Present Illness   43 year old male with the above past medical history including NICM with recovered EF, TIA, hypertension, hyperlipidemia, type 2 diabetes, and OSA.  He has a history of nonischemic cardiomyopathy, prior EF 45%.  Coronary CT angiogram in 2018 revealed normal coronary arteries, coronary calcium  score of 0.  Echocardiogram in 04/2018 showed EF improved to 60 to 65%, G1 DD, no significant valvular abnormality.  Most recent echocardiogram in 08/2022 showed EF 55 to 60%, normal LV function, no RWMA, mild LVH, normal RV systolic function, trivial mitral valve regurgitation.  He was last seen in the office on 12/06/2023 and was stable from a cardiac standpoint.  His BP was well-controlled. Hydrochlorothiazide  was decreased to 25 mg daily.  He presents today for follow-up. Since his last visit he has done well from a cardiac standpoint. He denies any chest pain, palpitations, dizziness, dyspnea, edema, PND, orthopnea, weight gain.  BP has been well-controlled.  Overall, he reports feeling well.  Home Medications  Current Outpatient Medications  Medication Sig Dispense Refill   Accu-Chek Softclix Lancets lancets Use as instructed 100 each 12   albuterol  (PROVENTIL ) (2.5 MG/3ML) 0.083% nebulizer solution Take 2.5 mg by nebulization every 4 (four) hours as needed for wheezing or shortness of breath.     albuterol  (VENTOLIN  HFA) 108 (90 Base) MCG/ACT inhaler  Inhale 2 puffs into the lungs daily as needed for wheezing or shortness of breath. 3 each 3   amLODipine  (NORVASC ) 10 MG tablet Take 1 tablet (10 mg total) by mouth daily. 90 tablet 3   aspirin  EC 81 MG tablet Take 1 tablet (81 mg total) by mouth daily. Swallow whole. 30 tablet 6   atorvastatin  (LIPITOR) 80 MG tablet Take 1 tablet (80 mg total) by mouth daily. 90 tablet 3   fluticasone  (FLOVENT  HFA) 110 MCG/ACT inhaler Inhale 3 puffs into the lungs 3 (three) times daily. 1 each 3   furosemide  (LASIX ) 40 MG tablet Take 40 mg by mouth 2 (two) times daily.     glucose blood test strip Use as instructed 100 each 12   glucose monitoring kit (FREESTYLE) monitoring kit 1 each by Does not apply route as needed for other. Dispense any model that is covered- dispense testing supplies for Q AC/ HS accuchecks- 1 month supply with one refil. 1 each 1   hydrALAZINE  (APRESOLINE ) 100 MG tablet Take 1 tablet (100 mg total) by mouth 2 (two) times daily. 180 tablet 3   hydrochlorothiazide  (HYDRODIURIL ) 25 MG tablet Take 1 tablet (25 mg total) by mouth daily. 30 tablet 2   isosorbide  mononitrate (IMDUR ) 120 MG 24 hr tablet Take 120 mg by mouth daily.     lisinopril  (ZESTRIL ) 40 MG tablet Take 1 tablet (40 mg total) by mouth daily. 90 tablet 3   metFORMIN  (GLUCOPHAGE ) 500 MG tablet TAKE 1 TABLET BY MOUTH TWICE DAILY WITH A MEAL 180 tablet 0   Multiple Vitamin (MULTIVITAMIN) tablet Take 1 tablet by mouth daily.     Nebivolol  HCl (BYSTOLIC ) 20 MG TABS Take 1 tablet (20 mg total) by mouth at bedtime. 90 tablet 1   nitroGLYCERIN  (NITROSTAT ) 0.4 MG SL tablet Place 1 tablet (0.4 mg total) under the tongue every 5 (five) minutes as needed for chest pain. 25 tablet 5   spironolactone  (ALDACTONE ) 25 MG tablet Take 1 tablet by mouth once daily 30 tablet 0   Omega-3 Fatty Acids (FISH OIL) 1000 MG CAPS Take 1,000 mg by mouth every evening. (Patient not taking: Reported on 03/02/2024)     No current facility-administered  medications for this visit.     Review of Systems    He denies chest pain, palpitations, dyspnea, pnd, orthopnea, n, v, dizziness, syncope, edema, weight gain, or early satiety. All other systems reviewed and are otherwise negative except as noted above.   Physical Exam    VS:  BP 136/74 (BP Location: Left Arm, Patient Position: Sitting, Cuff Size: Normal)   Pulse 83   Ht 6' 3 (1.905 m)   Wt 247 lb (112 kg)   SpO2 95%   BMI 30.87 kg/m  , BMI Body mass index is 30.87 kg/m. STOP-Bang Score:  5      GEN: Well nourished, well developed, in no acute distress. HEENT: normal. Neck: Supple, no JVD, carotid bruits, or masses. Cardiac: RRR, no murmurs, rubs, or gallops. No clubbing, cyanosis, edema.  Radials/DP/PT 2+ and equal bilaterally.  Respiratory:  Respirations regular and unlabored, clear to auscultation bilaterally. GI: Soft, nontender, nondistended,  BS + x 4. MS: no deformity or atrophy. Skin: warm and dry, no rash. Neuro:  Strength and sensation are intact. Psych: Normal affect.  Accessory Clinical Findings    ECG personally reviewed by me today -    - no EKG in office today.  Lab Results  Component Value Date   WBC 6.7 02/06/2023   HGB 14.2 02/06/2023   HCT 41.4 02/06/2023   MCV 89.6 02/06/2023   PLT 195 02/06/2023   Lab Results  Component Value Date   CREATININE 1.15 06/23/2021   BUN 16 06/23/2021   NA 141 06/23/2021   K 4.3 06/23/2021   CL 104 06/23/2021   CO2 24 06/23/2021   Lab Results  Component Value Date   ALT 13 01/20/2020   AST 12 01/20/2020   ALKPHOS 77 01/20/2020   BILITOT 0.3 01/20/2020   Lab Results  Component Value Date   CHOL 188 07/31/2023   HDL 60 07/31/2023   LDLCALC 112 (H) 07/31/2023   TRIG 86 07/31/2023   CHOLHDL 3.1 07/31/2023    Lab Results  Component Value Date   HGBA1C 5.6 01/20/2020    Assessment & Plan    1. NICM with recovered EF: Prior EF 45%.  Coronary CT angiogram in 2018 revealed normal coronary arteries,  coronary calcium  score of 0.  Most recent echocardiogram in 08/2022 showed  55 to 60%, normal LV function, no RWMA, mild LVH, normal RV systolic function, trivial mitral valve regurgitation. Euvolemic and well compensated on exam.  Continue Nebivolol , lisinopril , hydralazine , Imdur , spironolactone , hydrochlorothiazide , and Lasix .  2. Hypertension: BP well controlled.  Continue to monitor blood pressure report BP consistent greater than 130/80 mmHg. Continue current antihypertensive regimen.   3. Hyperlipidemia: LDL was 112 in 07/2023.  He was not fasting at the time.  Will update fasting lipids, CMET.  Continue atorvastatin .  4. History of TIA: Noted to likely have occurred in the setting of uncontrolled hypertension.  No recurrence.  Continue aspirin , atorvastatin .  5. Type 2 diabetes: A1c was 6.2 in 12/2023.  Monitor managed per PCP.  6. OSA: He no longer uses a CPAP.  This was previously managed per pulmonology, we discussed possible referral to pulmonology for ongoing management, he declines at this time.    7. Disposition: Follow-up in 6 months.      Damien JAYSON Braver, NP 03/02/2024, 6:39 PM
